# Patient Record
Sex: Female | Born: 1970
Health system: Southern US, Community
[De-identification: ages and names within clinical notes are randomized; demographics above are authoritative.]

## PROBLEM LIST (undated history)

## (undated) DIAGNOSIS — F32A Depression, unspecified: Secondary | ICD-10-CM

## (undated) DIAGNOSIS — M1990 Unspecified osteoarthritis, unspecified site: Secondary | ICD-10-CM

## (undated) DIAGNOSIS — T7840XA Allergy, unspecified, initial encounter: Secondary | ICD-10-CM

## (undated) DIAGNOSIS — K219 Gastro-esophageal reflux disease without esophagitis: Secondary | ICD-10-CM

## (undated) DIAGNOSIS — Z9889 Other specified postprocedural states: Secondary | ICD-10-CM

## (undated) DIAGNOSIS — R112 Nausea with vomiting, unspecified: Secondary | ICD-10-CM

## (undated) DIAGNOSIS — D649 Anemia, unspecified: Secondary | ICD-10-CM

## (undated) DIAGNOSIS — G709 Myoneural disorder, unspecified: Secondary | ICD-10-CM

## (undated) DIAGNOSIS — F419 Anxiety disorder, unspecified: Secondary | ICD-10-CM

## (undated) DIAGNOSIS — E119 Type 2 diabetes mellitus without complications: Secondary | ICD-10-CM

## (undated) DIAGNOSIS — E785 Hyperlipidemia, unspecified: Secondary | ICD-10-CM

## (undated) DIAGNOSIS — I10 Essential (primary) hypertension: Secondary | ICD-10-CM

## (undated) HISTORY — PX: INDUCED ABORTION: SHX677

## (undated) HISTORY — DX: Gastro-esophageal reflux disease without esophagitis: K21.9

## (undated) HISTORY — DX: Type 2 diabetes mellitus without complications: E11.9

## (undated) HISTORY — DX: Unspecified osteoarthritis, unspecified site: M19.90

## (undated) HISTORY — DX: Hyperlipidemia, unspecified: E78.5

## (undated) HISTORY — DX: Nausea with vomiting, unspecified: R11.2

## (undated) HISTORY — DX: Anemia, unspecified: D64.9

## (undated) HISTORY — PX: OTHER SURGICAL HISTORY: SHX169

## (undated) HISTORY — DX: Other specified postprocedural states: Z98.890

## (undated) HISTORY — DX: Allergy, unspecified, initial encounter: T78.40XA

## (undated) HISTORY — PX: DILATION AND CURETTAGE OF UTERUS: SHX78

## (undated) HISTORY — DX: Essential (primary) hypertension: I10

## (undated) HISTORY — DX: Anxiety disorder, unspecified: F41.9

## (undated) HISTORY — DX: Depression, unspecified: F32.A

## (undated) HISTORY — DX: Myoneural disorder, unspecified: G70.9

---

## 2011-04-28 ENCOUNTER — Telehealth (HOSPITAL_BASED_OUTPATIENT_CLINIC_OR_DEPARTMENT_OTHER): Payer: Self-pay | Admitting: Family Medicine

## 2011-05-05 ENCOUNTER — Ambulatory Visit (HOSPITAL_BASED_OUTPATIENT_CLINIC_OR_DEPARTMENT_OTHER): Payer: BC Managed Care – PPO | Attending: Family Medicine

## 2011-05-05 DIAGNOSIS — G4710 Hypersomnia, unspecified: Secondary | ICD-10-CM | POA: Insufficient documentation

## 2011-05-05 DIAGNOSIS — I4949 Other premature depolarization: Secondary | ICD-10-CM | POA: Insufficient documentation

## 2011-05-05 DIAGNOSIS — G4730 Sleep apnea, unspecified: Secondary | ICD-10-CM | POA: Insufficient documentation

## 2011-05-08 DIAGNOSIS — G4710 Hypersomnia, unspecified: Secondary | ICD-10-CM

## 2011-05-08 DIAGNOSIS — R0609 Other forms of dyspnea: Secondary | ICD-10-CM

## 2011-05-08 DIAGNOSIS — R0989 Other specified symptoms and signs involving the circulatory and respiratory systems: Secondary | ICD-10-CM

## 2011-05-08 DIAGNOSIS — G4730 Sleep apnea, unspecified: Secondary | ICD-10-CM

## 2011-05-08 NOTE — Procedures (Signed)
Carolyn Butler, Carolyn Butler                ACCOUNT NO.:  [id number]  MEDICAL RECORD NO.:  [id number]          PATIENT TYPE:  OUT  LOCATION:  SLEEP CENTER                 FACILITY:  [facility]  PHYSICIAN:  Quindarius Cabello D. Maple Hudson, MD, FCCP, FACPDATE OF BIRTH:  01-26-70  DATE OF STUDY:  05/05/2011                           NOCTURNAL POLYSOMNOGRAM  REFERRING PHYSICIAN:  Maurice March, M.D.  REFERRING PHYSICIAN:  Maurice March, MD  INDICATION FOR STUDY:  Hypersomnia with sleep apnea.  EPWORTH SLEEPINESS SCORE:  4/24.  BMI 34.7, weight 215 pounds, height 66 inches, neck 15 inches.  MEDICATIONS:  Home medications are charted and reviewed.  SLEEP ARCHITECTURE:  Total sleep time 285.5 minutes with sleep efficiency 74.4%.  Stage I was 10.5%.  Stage II 68%.  Stage III 0.7%. REM 20.8% of total sleep time.  Sleep latency 20 minutes, REM latency 75.5 minutes. Awake after sleep onset 79.5 minutes.  Arousal index 27.5.  BEDTIME MEDICATION:  None.  RESPIRATORY DATA:  Apnea-hypopnea index (AHI) 3.6 per hour. A total of 17 events was scored, all as hypopneas associated with nonsupine sleep position, and REM.  REM AHI 17.1 per hour. There were insufficient numbers of events to qualify for application of CPAP split protocol titration on this study night.  OXYGEN DATA:  Loud snoring with oxygen desaturation to a nadir of 89% and a mean oxygen saturation through the study is 96% on room air.  CARDIAC DATA:  Sinus rhythm with PVCs.  MOVEMENT-PARASOMNIA:  No significant movement disturbance.  Bathroom x1.  IMPRESSIONS-RECOMMENDATIONS: 1. Occasional respiratory events with respiratory disturbance, within     normal limits.  AHI 3.6 per hour (the normal range for adult is     between 0 and 5 per hour).  Loud snoring with oxygen desaturation     to a nadir of 89% and a mean oxygen saturation through the study of     96% on room air. 2. She reports difficulty at home initiating and maintaining sleep  and     also multiple bathroom trips.  On the study night, she was awake     without spontaneous reason between 2:30 and 3:15 a.m.  A single     bathroom trip.     Jalyiah Shelley D. Maple Hudson, MD, [facility], FACP Diplomate, Biomedical engineer of Sleep Medicine Electronically Signed    CDY/MEDQ  D:  05/08/2011 14:42:04  T:  05/08/2011 14:56:24  Job:  [id number]

## 2011-05-19 ENCOUNTER — Encounter (HOSPITAL_BASED_OUTPATIENT_CLINIC_OR_DEPARTMENT_OTHER): Payer: Self-pay

## 2011-08-13 ENCOUNTER — Ambulatory Visit: Payer: Self-pay | Admitting: Family Medicine

## 2011-08-17 ENCOUNTER — Ambulatory Visit (INDEPENDENT_AMBULATORY_CARE_PROVIDER_SITE_OTHER): Payer: BC Managed Care – PPO | Admitting: Family Medicine

## 2011-08-17 DIAGNOSIS — D518 Other vitamin B12 deficiency anemias: Secondary | ICD-10-CM

## 2011-08-29 ENCOUNTER — Other Ambulatory Visit: Payer: Self-pay | Admitting: Family Medicine

## 2011-10-03 ENCOUNTER — Other Ambulatory Visit: Payer: Self-pay | Admitting: Family Medicine

## 2011-11-16 ENCOUNTER — Ambulatory Visit (INDEPENDENT_AMBULATORY_CARE_PROVIDER_SITE_OTHER): Payer: BC Managed Care – PPO | Admitting: Family Medicine

## 2011-11-16 ENCOUNTER — Encounter: Payer: Self-pay | Admitting: Family Medicine

## 2011-11-16 VITALS — BP 135/97 | HR 97 | Temp 98.3°F | Resp 20 | Ht 66.0 in | Wt 223.6 lb

## 2011-11-16 DIAGNOSIS — G479 Sleep disorder, unspecified: Secondary | ICD-10-CM

## 2011-11-16 DIAGNOSIS — N907 Vulvar cyst: Secondary | ICD-10-CM

## 2011-11-16 DIAGNOSIS — N9089 Other specified noninflammatory disorders of vulva and perineum: Secondary | ICD-10-CM

## 2011-11-16 DIAGNOSIS — N951 Menopausal and female climacteric states: Secondary | ICD-10-CM

## 2011-11-16 DIAGNOSIS — IMO0001 Reserved for inherently not codable concepts without codable children: Secondary | ICD-10-CM

## 2011-11-16 DIAGNOSIS — I10 Essential (primary) hypertension: Secondary | ICD-10-CM

## 2011-11-16 LAB — POCT GLYCOSYLATED HEMOGLOBIN (HGB A1C): Hemoglobin A1C: 8.9

## 2011-11-16 MED ORDER — DOXYCYCLINE HYCLATE 100 MG PO TABS: 100.0000 mg | ORAL_TABLET | Freq: Two times a day (BID) | ORAL | Status: AC

## 2011-11-16 MED ORDER — METFORMIN HCL 1000 MG PO TABS: 1000.0000 mg | ORAL_TABLET | Freq: Two times a day (BID) | ORAL | Status: DC

## 2011-11-16 MED ORDER — LOSARTAN POTASSIUM 50 MG PO TABS: 50.0000 mg | ORAL_TABLET | Freq: Every day | ORAL | Status: DC

## 2011-11-16 NOTE — Patient Instructions (Signed)
Folliculitis  Folliculitis is an infection and inflammation of the hair follicles. Hair follicles become red and irritated. This inflammation is usually caused by bacteria. The bacteria thrive in warm, moist environments. This condition can be seen anywhere on the body.  CAUSES The most common cause of folliculitis is an infection by germs (bacteria). Fungal and viral infections can also cause the condition. Viral infections may be more common in people whose bodies are unable to fight disease well (weakened immune systems). Examples include people with:  AIDS.   An organ transplant.   Cancer.  People with depressed immune systems, diabetes, or obesity, have a greater risk of getting folliculitis than the general population. Certain chemicals, especially oils and tars, also can cause folliculitis. SYMPTOMS  An early sign of folliculitis is a small, white or yellow pus-filled, itchy lesion (pustule). These lesions appear on a red, inflamed follicle. They are usually less than 5 mm (.20 inches).   The most likely starting points are the scalp, thighs, legs, back and buttocks. Folliculitis is also frequently found in areas of repeated shaving.   When an infection of the follicle goes deeper, it becomes a boil or furuncle. A group of closely packed boils create a larger lesion (a carbuncle). These sores (lesions) tend to occur in hairy, sweaty areas of the body.  TREATMENT   A doctor who specializes in skin problems (dermatologists) treats mild cases of folliculitis with antiseptic washes.   They also use a skin application which kills germs (topical antibiotics). Tea tree oil is a good topical antiseptic as well. It can be found at a health food store. A small percentage of individuals may develop an allergy to the tea tree oil.   Mild to moderate boils respond well to warm water compresses applied three times daily.   In some cases, oral antibiotics should be taken with the skin treatment.     If lesions contain large quantities of pus or fluid, your caregiver may drain them. This allows the topical antibiotics to get to the affected areas better.   Stubborn cases of folliculitis may respond to laser hair removal. This process uses a high intensity light beam (a laser) to destroy the follicle and reduces the scarring from folliculitis. After laser hair removal, hair will no longer grow in the laser treated area.  Patients with long-lasting folliculitis need to find out where the infection is coming from. Germs can live in the nostrils of the patient. This can trigger an outbreak now and then. Sometimes the bacteria live in the nostrils of a family member. This person does not develop the disorder but they repeatedly re-expose others to the germ. To break the cycle of recurrence in the patient, the family member must also undergo treatment. PREVENTION   Individuals who are predisposed to folliculitis should be extremely careful about personal hygiene.   Application of antiseptic washes may help prevent recurrences.   A topical antibiotic cream, mupirocin (Bactroban), has been effective at reducing bacteria in the nostrils. It is applied inside the nose with your little finger. This is done twice daily for a week. Then it is repeated every 6 months.   Because follicle disorders tend to come back, patients must receive follow-up care. Your caregiver may be able to recognize a recurrence before it becomes severe.  SEEK IMMEDIATE MEDICAL CARE IF:   You develop redness, swelling, or increasing pain in the area.   You have a fever.   You are not improving with treatment  or are getting worse.   You have any other questions or concerns.  Document Released: 09/20/2001 Document Revised: 07/01/2011 Document Reviewed: 07/17/2008 [facility] Patient Information 2012 [place], [place].    Perimenopause Perimenopause is the time when your body begins to move into the menopause (no menstrual  period for 12 straight months). It is a natural process. Perimenopause can begin 2 to 8 years before the menopause and usually lasts for one year after the menopause. During this time, your ovaries may or may not produce an egg. The ovaries vary in their production of estrogen and progesterone hormones each month. This can cause irregular menstrual periods, difficulty in getting pregnant, vaginal bleeding between periods and uncomfortable symptoms. CAUSES  Irregular production of the ovarian hormones, estrogen and progesterone, and not ovulating every month.   Other causes include:   Tumor of the pituitary gland in the brain.   Medical disease that affects the ovaries.   Radiation treatment.   Chemotherapy.   Unknown causes.   Heavy smoking and excessive alcohol intake can bring on perimenopause sooner.  SYMPTOMS   Hot flashes.   Night sweats.   Irregular menstrual periods.   Decrease sex drive.   Vaginal dryness.   Headaches.   Mood swings.   Depression.   Memory problems.   Irritability.   Tiredness.   Weight gain.   Trouble getting pregnant.   The beginning of losing bone cells (osteoporosis).   The beginning of hardening of the arteries (atherosclerosis).  DIAGNOSIS  Your caregiver will make a diagnosis by analyzing your age, menstrual history and your symptoms. They will do a physical exam noting any changes in your body, especially your female organs. Female hormone tests may or may not be helpful depending on the amount and when you produce the female hormones. However, other hormone tests may be helpful (ex. thyroid hormone) to rule out other problems. TREATMENT  The decision to treat during the perimenopause should be made by you and your caregiver depending on how the symptoms are affecting you and your life style. There are various treatments available such as:  Treating individual symptoms with a specific medication for that symptom (ex. tranquilizer  for depression).   Herbal medications that can help specific symptoms.   Counseling.   Group therapy.   No treatment.  HOME CARE INSTRUCTIONS   Before seeing your caregiver, make a list of your menstrual periods (when the occur, how heavy they are, how long between periods and how long they last), your symptoms and when they started.   Take the medication as recommended by your caregiver.   Sleep and rest.   Exercise.   Eat a diet that contains calcium (good for your bones) and soy (acts like estrogen hormone).   Do not smoke.   Avoid alcoholic beverages.   Taking vitamin E may help in certain cases.   Take calcium and vitamin D supplements to help prevent bone loss.   Group therapy is sometimes helpful.   Acupuncture may help in some cases.  SEEK MEDICAL CARE IF:   You have any of the above and want to know if it is perimenopause.   You want advice and treatment for any of your symptoms mentioned above.   You need a referral to a specialist (gynecologist, psychiatrist or psychologist).  SEEK IMMEDIATE MEDICAL CARE IF:   You have vaginal bleeding.   Your period lasts longer than 8 days.   You periods are recurring sooner than 21 days.  You have bleeding after intercourse.   You have severe depression.   You have pain when you urinate.   You have severe headaches.   You develop vision problems.  Document Released: 08/19/2004 Document Revised: 07/01/2011 Document Reviewed: 05/09/2008 [facility] Patient Information 2012 [place], [place].

## 2011-11-16 NOTE — Progress Notes (Signed)
  Subjective:    Patient ID: Carolyn Butler, female    DOB: Jan 29, 1971, 40 y.o.   MRN: [id number]  HPI   This pt is here for 43-month Diabetes recheck. FSBS 3x/ week. Morning sugars are always high.  She denies missing any doses of Metformin and has no sugars below 70. She has been having some   muscle cramps. Taking OTC Vit D3 2000IU (4 caps daily= 8000IU) and getting some daily sun exposure.  She had a cystic lesion on the vaginal area; it was very painful 4 days ago. The swelling resolved with  warm compresses. She had a similar lesion come up about 6 months ago in the same area.  Her menses has been irregular menses (normal in January, skipped February, very light in March  and April). Having other peri-menopausal symptoms (night sweats, hot flashes.moodiness).    Review of Systems  As per HPI     Objective:   Physical Exam  Vitals reviewed. Constitutional: She is oriented to person, place, and time. She appears well-developed and well-nourished.       Pt has lost 5 lbs. since January  HENT:  Head: Normocephalic and atraumatic.  Cardiovascular: Normal rate.   Pulmonary/Chest: Effort normal. No respiratory distress.  Genitourinary:    There is no rash, tenderness or lesion on the right labia. There is tenderness and lesion on the left labia. No vaginal discharge found.       1- 1.5 cm subcutaneous nodule lesion which is tender; not drainage or ulceration  Neurological: She is alert and oriented to person, place, and time. No cranial nerve deficit.    Results for orders placed in visit on 11/16/11  POCT GLYCOSYLATED HEMOGLOBIN (HGB A1C)      Component Value Range   Hemoglobin A1C 8.04 August 2011  LABS:   Cr=0.43     SGOT= 43    SGPT= 51   Microalbumin= 3.47    Vit D= 23     Assessment & Plan:   1. Type II or unspecified type diabetes mellitus without mention of complication, uncontrolled  January Hb A1C= 7.1% Continue weight reduction and portion control; Medication  RFs routed to pharmacy  2. Labial cyst  RX: Doxycycline 100 mg  1 tab twice daily  Continue sitz baths/ warm compresses  3. Perimenopause  Print info given

## 2011-11-20 ENCOUNTER — Encounter: Payer: Self-pay | Admitting: Family Medicine

## 2011-11-20 DIAGNOSIS — G479 Sleep disorder, unspecified: Secondary | ICD-10-CM | POA: Insufficient documentation

## 2011-11-20 DIAGNOSIS — E669 Obesity, unspecified: Secondary | ICD-10-CM | POA: Insufficient documentation

## 2011-11-20 DIAGNOSIS — I10 Essential (primary) hypertension: Secondary | ICD-10-CM | POA: Insufficient documentation

## 2011-11-24 ENCOUNTER — Telehealth: Payer: Self-pay

## 2011-11-24 NOTE — Telephone Encounter (Signed)
Pt states that she thinks that she may be allergic to the medication that she was prescribed by Dr.Mcpherson. Pt would not give any further information regarding symptoms

## 2011-11-25 NOTE — Telephone Encounter (Signed)
Called patient. Had to leave a message to have patient call back.

## 2011-11-25 NOTE — Telephone Encounter (Signed)
Please get more info.

## 2011-11-26 NOTE — Telephone Encounter (Signed)
LMOM to CB. We only have one phone number for pt.

## 2011-11-27 MED ORDER — FLUCONAZOLE 150 MG PO TABS: 150.0000 mg | ORAL_TABLET | Freq: Once | ORAL | Status: AC

## 2011-11-27 NOTE — Telephone Encounter (Signed)
Diflucan sent to the pharmacy

## 2011-11-27 NOTE — Telephone Encounter (Signed)
PT THINKS SHE MAY HAVE A YEAST INFECTION.  SHE IS ALMOST FINISH WITH THE DOXY.  SHE HAS TRIED MONISTAT AND THAT IS NOT WORKING.  CAN WE GIVE HER SOMETHING FOR YEAST

## 2011-11-27 NOTE — Telephone Encounter (Signed)
Pt.notified

## 2012-01-10 ENCOUNTER — Other Ambulatory Visit: Payer: Self-pay | Admitting: Physician Assistant

## 2012-02-03 ENCOUNTER — Encounter: Payer: Self-pay | Admitting: Family Medicine

## 2012-02-03 ENCOUNTER — Ambulatory Visit (INDEPENDENT_AMBULATORY_CARE_PROVIDER_SITE_OTHER): Payer: BC Managed Care – PPO | Admitting: Family Medicine

## 2012-02-03 VITALS — BP 144/90 | HR 105 | Temp 97.9°F | Resp 18 | Ht 64.75 in | Wt 219.0 lb

## 2012-02-03 DIAGNOSIS — N760 Acute vaginitis: Secondary | ICD-10-CM

## 2012-02-03 LAB — POCT GLYCOSYLATED HEMOGLOBIN (HGB A1C): Hemoglobin A1C: 10.3

## 2012-02-03 LAB — POCT WET PREP WITH KOH

## 2012-02-03 MED ORDER — FLUCONAZOLE 150 MG PO TABS: 150.0000 mg | ORAL_TABLET | Freq: Once | ORAL | Status: AC

## 2012-02-03 MED ORDER — GLIMEPIRIDE 2 MG PO TABS: ORAL_TABLET | ORAL | Status: DC

## 2012-02-03 MED ORDER — METRONIDAZOLE 500 MG PO TABS: ORAL_TABLET | ORAL | Status: DC

## 2012-02-03 NOTE — Progress Notes (Signed)
  Subjective:    Patient ID: Carolyn Butler, female    DOB: [birth year], 41 y.o.   MRN: [id number]  HPI   Pt is a 41 y.o. Diabetic Diabetic who is having a lot of vag itch and discharge for several months. OTC  Monistat -7 not very helpful. Concerned that she may have left a tampon in or problem related to using [place] lubricant.  FSBS have not been checked daily and fasting this AM was "very high". Had dinner at [facility] last  night at 8 PM with chicken skewers (ate 1 skewer) and pita bread with another starch. She has been  doing "shakes" in the AM and adding a little sugar and instant coffee for flavor.   Menses irreg but job has been very stressful.    Review of Systems  Constitutional: Negative for fever, activity change and appetite change.  Eyes: Negative for visual disturbance.  Cardiovascular: Negative for chest pain and palpitations.  Genitourinary: Positive for dysuria, vaginal discharge, vaginal pain and menstrual problem. Negative for pelvic pain.  Neurological: Negative for light-headedness, numbness and headaches.       Objective:   Physical Exam  Vitals reviewed. Constitutional: She is oriented to person, place, and time. She appears well-developed and well-nourished. No distress.  HENT:  Head: Normocephalic and atraumatic.  Eyes: EOM are normal. No scleral icterus.  Cardiovascular: Normal rate.   Pulmonary/Chest: Effort normal. No respiratory distress.  Abdominal: She exhibits no mass. There is no tenderness. There is no guarding.  Genitourinary: There is rash on the right labia. There is no tenderness or lesion on the right labia. There is rash on the left labia. There is no tenderness or lesion on the left labia. There is erythema around the vagina. No tenderness or bleeding around the vagina. No foreign body around the vagina. Vaginal discharge found.  Neurological: She is alert and oriented to person, place, and time. No cranial nerve deficit.  Skin: Skin is warm and dry.     Results for orders placed in visit on 02/03/12  POCT GLYCOSYLATED HEMOGLOBIN (HGB A1C)      Component Value Range   Hemoglobin A1C 10.3    POCT WET PREP WITH KOH      Component Value Range   Trichomonas, UA Negative     Clue Cells Wet Prep HPF POC 10-20     Epithelial Wet Prep HPF POC tntc     Yeast Wet Prep HPF POC neg     Bacteria Wet Prep HPF POC 2+     RBC Wet Prep HPF POC 0-9     WBC Wet Prep HPF POC 0-8     KOH Prep POC Positive           Assessment & Plan:   1. Type II or unspecified type diabetes mellitus without mention of complication, uncontrolled  Continue Metformin 1000 mg bid RX: Amaryl 2 mg 1 tab bid before meals Diabetic meal planning and continued focus on weight reduction  2. Vaginitis and vulvovaginitis  RX: Metronidazole 500 mg 1 tab bid  # 14 RX: Diflucan 150 mg 1 tab x 1 dose Print info re: Bact. Vag. Discontinue use of scented [place] lubricants  (use plain product)

## 2012-02-03 NOTE — Patient Instructions (Signed)
Bacterial Vaginosis Bacterial vaginosis (BV) is a vaginal infection where the normal balance of bacteria in the vagina is disrupted. The normal balance is then replaced by an overgrowth of certain bacteria. There are several different kinds of bacteria that can cause BV. BV is the most common vaginal infection in women of childbearing age. CAUSES   The cause of BV is not fully understood. BV develops when there is an increase or imbalance of harmful bacteria.   Some activities or behaviors can upset the normal balance of bacteria in the vagina and put women at increased risk including:   Having a new sex partner or multiple sex partners.   Douching.   Using an intrauterine device (IUD) for contraception.   It is not clear what role sexual activity plays in the development of BV. However, women that have never had sexual intercourse are rarely infected with BV.  Women do not get BV from toilet seats, bedding, swimming pools or from touching objects around them.  SYMPTOMS   Grey vaginal discharge.   A fish-like odor with discharge, especially after sexual intercourse.   Itching or burning of the vagina and vulva.   Burning or pain with urination.   Some women have no signs or symptoms at all.  DIAGNOSIS  Your caregiver must examine the vagina for signs of BV. Your caregiver will perform lab tests and look at the sample of vaginal fluid through a microscope. They will look for bacteria and abnormal cells (clue cells), a pH test higher than 4.5, and a positive amine test all associated with BV.  RISKS AND COMPLICATIONS   Pelvic inflammatory disease (PID).   Infections following gynecology surgery.   Developing HIV.   Developing herpes virus.  TREATMENT  Sometimes BV will clear up without treatment. However, all women with symptoms of BV should be treated to avoid complications, especially if gynecology surgery is planned. Female partners generally do not need to be treated. However,  BV may spread between female sex partners so treatment is helpful in preventing a recurrence of BV.   BV may be treated with antibiotics. The antibiotics come in either pill or vaginal cream forms. Either can be used with nonpregnant or pregnant women, but the recommended dosages differ. These antibiotics are not harmful to the baby.   BV can recur after treatment. If this happens, a second round of antibiotics will often be prescribed.   Treatment is important for pregnant women. If not treated, BV can cause a premature delivery, especially for a pregnant woman who had a premature birth in the past. All pregnant women who have symptoms of BV should be checked and treated.   For chronic reoccurrence of BV, treatment with a type of prescribed gel vaginally twice a week is helpful.  HOME CARE INSTRUCTIONS   Finish all medication as directed by your caregiver.   Do not have sex until treatment is completed.   Tell your sexual partner that you have a vaginal infection. They should see their caregiver and be treated if they have problems, such as a mild rash or itching.   Practice safe sex. Use condoms. Only have 1 sex partner.  PREVENTION  Basic prevention steps can help reduce the risk of upsetting the natural balance of bacteria in the vagina and developing BV:  Do not have sexual intercourse (be abstinent).   Do not douche.   Use all of the medicine prescribed for treatment of BV, even if the signs and symptoms go away.     Tell your sex partner if you have BV. That way, they can be treated, if needed, to prevent reoccurrence.  SEEK MEDICAL CARE IF:   Your symptoms are not improving after 3 days of treatment.   You have increased discharge, pain, or fever.  MAKE SURE YOU:   Understand these instructions.   Will watch your condition.   Will get help right away if you are not doing well or get worse.  FOR MORE INFORMATION  Division of STD Prevention (DSTDP), Centers for Disease  Control and Prevention: [employer]Apps.[employer] American Social Health Association (ASHA): www.ashastd.org  Document Released: 07/12/2005 Document Revised: 07/01/2011 Document Reviewed: 01/02/2009 [facility] Patient Information 2012 [place], [place]     .Diabetes Meal Planning Guide The diabetes meal planning guide is a tool to help you plan your meals and snacks. It is important for people with diabetes to manage their blood glucose (sugar) levels. Choosing the right foods and the right amounts throughout your day will help control your blood glucose. Eating right can even help you improve your blood pressure and reach or maintain a healthy weight. CARBOHYDRATE COUNTING MADE EASY When you eat carbohydrates, they turn to sugar. This raises your blood glucose level. Counting carbohydrates can help you control this level so you feel better. When you plan your meals by counting carbohydrates, you can have more flexibility in what you eat and balance your medicine with your food intake. Carbohydrate counting simply means adding up the total amount of carbohydrate grams in your meals and snacks. Try to eat about the same amount at each meal. Foods with carbohydrates are listed below. Each portion below is 1 carbohydrate serving or 15 grams of carbohydrates. Ask your dietician how many grams of carbohydrates you should eat at each meal or snack. Grains and Starches  1 slice bread.    English muffin or hotdog/hamburger bun.    cup cold cereal (unsweetened).   ? cup cooked pasta or rice.    cup starchy vegetables (corn, potatoes, peas, beans, winter squash).   1 tortilla (6 inches).    bagel.   1 waffle or pancake (size of a CD).    cup cooked cereal.   4 to 6 small crackers.  *Whole grain is recommended. Fruit  1 cup fresh unsweetened berries, melon, papaya, pineapple.   1 small fresh fruit.    banana or mango.    cup fruit juice (4 oz unsweetened).    cup canned fruit in natural  juice or water.   2 tbs dried fruit.   12 to 15 grapes or cherries.  Milk and Yogurt  1 cup fat-free or 1% milk.   1 cup soy milk.   6 oz light yogurt with sugar-free sweetener.   6 oz low-fat soy yogurt.   6 oz plain yogurt.  Vegetables  1 cup raw or  cup cooked is counted as 0 carbohydrates or a "free" food.   If you eat 3 or more servings at 1 meal, count them as 1 carbohydrate serving.  Other Carbohydrates   oz chips or pretzels.    cup ice cream or frozen yogurt.    cup sherbet or sorbet.   2 inch square cake, no frosting.   1 tbs honey, sugar, jam, jelly, or syrup.   2 small cookies.   3 squares of graham crackers.   3 cups popcorn.   6 crackers.   1 cup broth-based soup.   Count 1 cup casserole or other mixed foods as 2 carbohydrate  servings.   Foods with less than 20 calories in a serving may be counted as 0 carbohydrates or a "free" food.  You may want to purchase a book or computer software that lists the carbohydrate gram counts of different foods. In addition, the nutrition facts panel on the labels of the foods you eat are a good source of this information. The label will tell you how big the serving size is and the total number of carbohydrate grams you will be eating per serving. Divide this number by 15 to obtain the number of carbohydrate servings in a portion. Remember, 1 carbohydrate serving equals 15 grams of carbohydrate. SERVING SIZES Measuring foods and serving sizes helps you make sure you are getting the right amount of food. The list below tells how big or small some common serving sizes are.  1 oz.........4 stacked dice.   3 oz........Marland KitchenDeck of cards.   1 tsp.......Marland KitchenTip of little finger.   1 tbs......Marland KitchenMarland KitchenThumb.   2 tbs.......Marland KitchenGolf ball.    cup......Marland KitchenHalf of a fist.   1 cup.......Marland KitchenA fist.  SAMPLE DIABETES MEAL PLAN Below is a sample meal plan that includes foods from the grain and starches, dairy, vegetable, fruit, and meat  groups. A dietician can individualize a meal plan to fit your calorie needs and tell you the number of servings needed from each food group. However, controlling the total amount of carbohydrates in your meal or snack is more important than making sure you include all of the food groups at every meal. You may interchange carbohydrate containing foods (dairy, starches, and fruits). The meal plan below is an example of a 2000 calorie diet using carbohydrate counting. This meal plan has 17 carbohydrate servings. Breakfast  1 cup oatmeal (2 carb servings).    cup light yogurt (1 carb serving).   1 cup blueberries (1 carb serving).    cup almonds.  Snack  1 large apple (2 carb servings).   1 low-fat string cheese stick.  Lunch  Chicken breast salad.   1 cup spinach.    cup chopped tomatoes.   2 oz chicken breast, sliced.   2 tbs low-fat [place] dressing.   12 whole-wheat crackers (2 carb servings).   12 to 15 grapes (1 carb serving).   1 cup low-fat milk (1 carb serving).  Snack  1 cup carrots.    cup hummus (1 carb serving).  Dinner  3 oz broiled salmon.   1 cup brown rice (3 carb servings).  Snack  1  cups steamed broccoli (1 carb serving) drizzled with 1 tsp olive oil and lemon juice.   1 cup light pudding (2 carb servings).  DIABETES MEAL PLANNING WORKSHEET Your dietician can use this worksheet to help you decide how many servings of foods and what types of foods are right for you.  BREAKFAST Food Group and Servings / Carb Servings Grain/Starches __________________________________ Dairy __________________________________________ Vegetable ______________________________________ Fruit ___________________________________________ Meat __________________________________________ Fat ____________________________________________ LUNCH Food Group and Servings / Carb Servings Grain/Starches ___________________________________ Dairy  ___________________________________________ Fruit ____________________________________________ Meat ___________________________________________ Fat _____________________________________________ Laural Golden Food Group and Servings / Carb Servings Grain/Starches ___________________________________ Dairy ___________________________________________ Fruit ____________________________________________ Meat ___________________________________________ Fat _____________________________________________ SNACKS Food Group and Servings / Carb Servings Grain/Starches ___________________________________ Dairy ___________________________________________ Vegetable _______________________________________ Fruit ____________________________________________ Meat ___________________________________________ Fat _____________________________________________ DAILY TOTALS Starches _________________________ Vegetable ________________________ Fruit ____________________________ Dairy ____________________________ Meat ____________________________ Fat ______________________________ Document Released: 04/08/2005 Document Revised: 07/01/2011 Document Reviewed: 02/17/2009 [employer] Patient Information 2012 [place], [place].

## 2012-04-18 ENCOUNTER — Other Ambulatory Visit: Payer: Self-pay | Admitting: Physician Assistant

## 2012-05-01 ENCOUNTER — Other Ambulatory Visit: Payer: Self-pay | Admitting: Family Medicine

## 2012-05-27 ENCOUNTER — Other Ambulatory Visit: Payer: Self-pay | Admitting: Family Medicine

## 2012-07-05 ENCOUNTER — Ambulatory Visit (INDEPENDENT_AMBULATORY_CARE_PROVIDER_SITE_OTHER): Payer: BC Managed Care – PPO | Admitting: Family Medicine

## 2012-07-05 ENCOUNTER — Encounter: Payer: Self-pay | Admitting: Family Medicine

## 2012-07-05 VITALS — BP 134/82 | HR 100 | Temp 99.3°F | Resp 16 | Ht 65.0 in | Wt 233.0 lb

## 2012-07-05 DIAGNOSIS — N898 Other specified noninflammatory disorders of vagina: Secondary | ICD-10-CM

## 2012-07-05 DIAGNOSIS — Z8639 Personal history of other endocrine, nutritional and metabolic disease: Secondary | ICD-10-CM

## 2012-07-05 DIAGNOSIS — L293 Anogenital pruritus, unspecified: Secondary | ICD-10-CM

## 2012-07-05 DIAGNOSIS — R635 Abnormal weight gain: Secondary | ICD-10-CM

## 2012-07-05 MED ORDER — FLUCONAZOLE 150 MG PO TABS: 150.0000 mg | ORAL_TABLET | Freq: Once | ORAL | Status: DC

## 2012-07-05 MED ORDER — METFORMIN HCL 1000 MG PO TABS: 1000.0000 mg | ORAL_TABLET | Freq: Two times a day (BID) | ORAL | Status: DC

## 2012-07-05 MED ORDER — SITAGLIPTIN PHOSPHATE 100 MG PO TABS: 100.0000 mg | ORAL_TABLET | Freq: Every day | ORAL | Status: DC

## 2012-07-05 NOTE — Patient Instructions (Addendum)
1800 Calorie Diet for Diabetes Meal Planning The 1800 calorie diet is designed for eating up to 1800 calories each day. Following this diet and making healthy meal choices can help improve overall health. This diet controls blood sugar (glucose) levels and can also help lower blood pressure and cholesterol. SERVING SIZES Measuring foods and serving sizes helps to make sure you are getting the right amount of food. The list below tells how big or small some common serving sizes are:  1 oz.........4 stacked dice.  3 oz........Marland KitchenDeck of cards.  1 tsp.......Marland KitchenTip of little finger.  1 tbs......Marland KitchenMarland KitchenThumb.  2 tbs.......Marland KitchenGolf ball.   cup......Marland KitchenHalf of a fist.  1 cup.......Marland KitchenA fist. GUIDELINES FOR CHOOSING FOODS The goal of this diet is to eat a variety of foods and limit calories to 1800 each day. This can be done by choosing foods that are low in calories and fat. The diet also suggests eating small amounts of food frequently. Doing this helps control your blood glucose levels so they do not get too high or too low. Each meal or snack may include a protein food source to help you feel more satisfied and to stabilize your blood glucose. Try to eat about the same amount of food around the same time each day. This includes weekend days, travel days, and days off work. Space your meals about 4 to 5 hours apart and add a snack between them if you wish.  For example, a daily food plan could include breakfast, a morning snack, lunch, dinner, and an evening snack. Healthy meals and snacks include whole grains, vegetables, fruits, lean meats, poultry, fish, and dairy products. As you plan your meals, select a variety of foods. Choose from the bread and starch, vegetable, fruit, dairy, and meat/protein groups. Examples of foods from each group and their suggested serving sizes are listed below. Use measuring cups and spoons to become familiar with what a healthy portion looks like. Bread and Starch Each serving  equals 15 grams of carbohydrates.  1 slice bread.   bagel.   cup cold cereal (unsweetened).   cup hot cereal or mashed potatoes.  1 small potato (size of a computer mouse).   cup cooked pasta or rice.   English muffin.  1 cup broth-based soup.  3 cups of popcorn.  4 to 6 whole-wheat crackers.   cup cooked beans, peas, or corn. Vegetable Each serving equals 5 grams of carbohydrates.   cup cooked vegetables.  1 cup raw vegetables.   cup tomato or vegetable juice. Fruit Each serving equals 15 grams of carbohydrates.  1 small apple or orange.  1 cup watermelon or strawberries.   cup applesauce (no sugar added).  2 tbs raisins.   banana.   cup canned fruit, packed in water, its own juice, or sweetened with a sugar substitute.   cup unsweetened fruit juice. Dairy Each serving equals 12 to 15 grams of carbohydrates.  1 cup fat-free milk.  6 oz artificially sweetened yogurt or plain yogurt.  1 cup low-fat buttermilk.  1 cup soy milk.  1 cup almond milk. Meat/Protein  1 large egg.  2 to 3 oz meat, poultry, or fish.   cup low-fat cottage cheese.  1 tbs peanut butter.  1 oz low-fat cheese.   cup tuna in water.   cup tofu. Fat  1 tsp oil.  1 tsp trans-fat-free margarine.  1 tsp butter.  1 tsp mayonnaise.  2 tbs avocado.  1 tbs salad dressing.  1 tbs cream cheese.  2 tbs sour cream. SAMPLE 1800 CALORIE DIET PLAN Breakfast   cup unsweetened cereal (1 carb serving).  1 cup fat-free milk (1 carb serving).  1 slice whole-wheat toast (1 carb serving).   small banana (1 carb serving).  1 scrambled egg.  1 tsp trans-fat-free margarine. Lunch  Tuna sandwich.  2 slices whole-wheat bread (2 carb servings).   cup canned tuna in water, drained.  1 tbs reduced fat mayonnaise.  1 stalk celery, chopped.  2 slices tomato.  1 lettuce leaf.  1 cup carrot sticks.  24 to 30 seedless grapes (2 carb  servings).  6 oz light yogurt (1 carb serving). Afternoon Snack  3 graham cracker squares (1 carb serving).  Fat-free milk, 1 cup (1 carb serving).  1 tbs peanut butter. Dinner  3 oz salmon, broiled with 1 tsp oil.  1 cup mashed potatoes (2 carb servings) with 1 tsp trans-fat-free margarine.  1 cup fresh or frozen green beans.  1 cup steamed asparagus.  1 cup fat-free milk (1 carb serving). Evening Snack  3 cups air-popped popcorn (1 carb serving).  2 tbs parmesan cheese sprinkled on top. MEAL PLAN Use this worksheet to help you make a daily meal plan based on the 1800 calorie diet suggestions. If you are using this plan to help you control your blood glucose, you may interchange carbohydrate-containing foods (dairy, starches, and fruits). Select a variety of fresh foods of varying colors and flavors. The total amount of carbohydrate in your meals or snacks is more important than making sure you include all of the food groups every time you eat. Choose from the following foods to build your day's meals:  8 Starches.  4 Vegetables.  3 Fruits.  2 Dairy.  6 to 7 oz Meat/Protein.  Up to 4 Fats. Your dietician can use this worksheet to help you decide how many servings and which types of foods are right for you. BREAKFAST Food Group and Servings / Food Choice Starch ________________________________________________________ Dairy _________________________________________________________ Fruit _________________________________________________________ Meat/Protein __________________________________________________ Fat ___________________________________________________________ LUNCH Food Group and Servings / Food Choice Starch ________________________________________________________ Meat/Protein __________________________________________________ Vegetable _____________________________________________________ Fruit  _________________________________________________________ Dairy _________________________________________________________ Fat ___________________________________________________________ Aura Fey Food Group and Servings / Food Choice Starch ________________________________________________________ Meat/Protein __________________________________________________ Fruit __________________________________________________________ Dairy _________________________________________________________ Laural Golden Food Group and Servings / Food Choice Starch _________________________________________________________ Meat/Protein ___________________________________________________ Dairy __________________________________________________________ Vegetable ______________________________________________________ Fruit ___________________________________________________________ Fat ____________________________________________________________ Lollie Sails Food Group and Servings / Food Choice Fruit __________________________________________________________ Meat/Protein ___________________________________________________ Dairy __________________________________________________________ Starch _________________________________________________________ DAILY TOTALS Starch ____________________________ Vegetable _________________________ Fruit _____________________________ Dairy _____________________________ Meat/Protein______________________ Fat _______________________________ Document Released: 02/01/2005 Document Revised: 10/04/2011 Document Reviewed: 05/28/2011 [employer] Patient Information 2013 [place], [place].    It is important that you check your blood sugars daily so that you have an idea of how certain foods and exercise affect your sugar and make adjustments accordingly. Once you start exercising, you will want to follow your sugars sothat you do not have episodes of low sugars (which can happen when  you exercise). Check your sugar before exercising; if it is below 100, you should have a snack that has some carbs and protein in it prior to exercise.

## 2012-07-05 NOTE — Progress Notes (Signed)
S:  This 41 y.o. Cauc female has Type II DM; she is not testing FSBS. She admits weight gain but not sure how this happened because her eating habits have not changed. She is considering Weight Watchers. Plans to get a treadmill for home use and start a daily exercise routine; cold weather has curtailed her walking. She attributes some of the weight gain to Glimepiride.  She also c/o external vaginal itching w/o discharge or odor. She tried OTC [place] w/ Aloe to see if that would help. Her underwear do not have cotton panels. She also wears panty hose regularly.  ROS: Negative for diaphoresis, fatigue, vision disturbances, CP or tightness, palpitations, SOB or DOE, GI problems, polyuria, polydipsia, edema, HA, dizziness, tremor, numbness, weakness or syncope.  O:  Filed Vitals:   07/05/12 1125  BP: 134/82                                                 Weight up 14 lbs.  Pulse: 100  Temp: 99.3 F (37.4 C)  Resp: 16   GEN: In NAD; WN,WD.\ HENT: [place]/AT; EOMI w/ clear conj/sclerae. NECK: Supple w/o LAN. COR: RRR. LUNGS: Normal resp rate and effort. NEURO: A&O x 3; CNs intact. Nonfocal.  Results for orders placed in visit on 07/05/12  POCT GLYCOSYLATED HEMOGLOBIN (HGB A1C)      Component Value Range   Hemoglobin A1C 8.4      A/P:  1. Type II or unspecified type diabetes mellitus without mention of complication, uncontrolled  Comprehensive metabolic panel Discontinue Glimepiride RX: Januvia 100 mg 1 tab daily  2. H/O vitamin D deficiency  Vitamin D, 25-hydroxy  3. Weight gain, abnormal  Join weight watchers or other weight loss program and start daily exercise  4. Vaginal itching  RX: Diflucan 150 mg x 1 dose Panties need to have cotton panel in crotch Double rinse underwear

## 2012-07-06 LAB — COMPREHENSIVE METABOLIC PANEL
AST: 72 U/L — ABNORMAL HIGH (ref 0–37)
Alkaline Phosphatase: 32 U/L — ABNORMAL LOW (ref 39–117)
BUN: 6 mg/dL (ref 6–23)
Calcium: 10.1 mg/dL (ref 8.4–10.5)
Chloride: 94 mEq/L — ABNORMAL LOW (ref 96–112)
Creat: 0.46 mg/dL — ABNORMAL LOW (ref 0.50–1.10)
Total Bilirubin: 0.6 mg/dL (ref 0.3–1.2)

## 2012-07-06 NOTE — Progress Notes (Signed)
Quick Note:  Please call pt and advise that the following labs are abnormal... Some of your lab results came back a little abnormal. This is most likely associated with inadequately controlled Diabetes. Your blood sugar is above normal (which was expected); kidney function is normal.  Some of the liver enzymes are up due to "fatty" liver which we see with patients who are overweight. We will recheck these labs at you next visit.  It is really important that you stick to your commitment to lose weight. Carolyn Butler!  Copy to pt. ______

## 2012-09-06 ENCOUNTER — Ambulatory Visit (INDEPENDENT_AMBULATORY_CARE_PROVIDER_SITE_OTHER): Payer: BC Managed Care – PPO | Admitting: Family Medicine

## 2012-09-06 VITALS — BP 122/76 | HR 88 | Temp 98.1°F | Resp 16

## 2012-09-06 DIAGNOSIS — Z23 Encounter for immunization: Secondary | ICD-10-CM

## 2012-09-18 ENCOUNTER — Ambulatory Visit (INDEPENDENT_AMBULATORY_CARE_PROVIDER_SITE_OTHER): Payer: BC Managed Care – PPO | Admitting: Family Medicine

## 2012-09-18 VITALS — BP 139/91 | HR 105 | Temp 98.2°F | Resp 16 | Ht 65.5 in | Wt 219.4 lb

## 2012-09-18 DIAGNOSIS — N926 Irregular menstruation, unspecified: Secondary | ICD-10-CM

## 2012-09-18 LAB — POCT CBC
Granulocyte percent: 49.5 %G (ref 37–80)
MID (cbc): 0.5 (ref 0–0.9)
POC Granulocyte: 4.1 (ref 2–6.9)
POC LYMPH PERCENT: 44.9 %L (ref 10–50)
POC MID %: 5.6 %M (ref 0–12)
Platelet Count, POC: 442 10*3/uL — AB (ref 142–424)
RDW, POC: 15.7 %

## 2012-09-18 LAB — COMPREHENSIVE METABOLIC PANEL
AST: 20 U/L (ref 0–37)
BUN: 7 mg/dL (ref 6–23)
Calcium: 9.8 mg/dL (ref 8.4–10.5)
Chloride: 90 mEq/L — ABNORMAL LOW (ref 96–112)
Creat: 0.64 mg/dL (ref 0.50–1.10)

## 2012-09-18 LAB — GLUCOSE, POCT (MANUAL RESULT ENTRY): POC Glucose: 311 mg/dl — AB (ref 70–99)

## 2012-09-18 LAB — POCT GLYCOSYLATED HEMOGLOBIN (HGB A1C): Hemoglobin A1C: 10.4

## 2012-09-18 MED ORDER — FLUTICASONE PROPIONATE 50 MCG/ACT NA SUSP: 2.0000 | Freq: Every day | NASAL | Status: DC

## 2012-09-18 NOTE — Progress Notes (Signed)
[facility] [address], [place] [address] [phone]  Date:  09/18/2012   Name:  Carolyn Butler   DOB:  1971/03/16   MRN:  [id number]  PCP:  Dow Adolph, MD    Chief Complaint: Headache   History of Present Illness:  Carolyn Butler is a 42 y.o. very pleasant female patient who presents with the following:  Here today with several concerns and to follow- up her DM.    For the last 2 or 3 weeks she has noted onset of poor sleep, feeling tired during the day, sinus and chest congestion.  She notes a sharp headache that will occur over her right temple. Occasionally will occur on the left.  The pain may last a minute or less.  This has gone on for about 3 days.  She may notice this pain 7 or 8 times a day She has not noted any body aches, but does have some cramps in her calves that occur at night- this has gone on for about one month No fever.  No cough.  No GI symptoms.   No ST, no earache but her ears sometimes hurt when she talks on the phone for a long time.   She does not have a cough, but will sometimes "spit up phlegm from my throat."    She has not been testing her glucose much lately.   Her last A1c was 8.4 in December of 2013.    She will check her BP when she visits a store.  She found it was elevated when she checked it a few days ago.    LMP was about 10 days ago.  However, her menses have become irregular over the last year or so. She and her spouse are not using any contraception at this time  She is taking zantac, metformin and losartan. She does admit to a lot of stress at home and at work recently.  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  She feels that her moods go up and down, and she is taking her anger out on her husband.  She has felt this way for about a week- does not wish to start medication right now but would consider it if she does not feel better soon.   Patient  Active Problem List  Diagnosis  . Type II or unspecified type diabetes mellitus without mention of complication, uncontrolled  . Benign essential HTN  . Obesity, Class II, BMI 35-39.9, with comorbidity  . Sleep disturbance    Past Medical History  Diagnosis Date  . Diabetes mellitus without complication     No past surgical history on file.  History  Substance Use Topics  . Smoking status: Never Smoker   . Smokeless tobacco: Not on file  . Alcohol Use: Not on file    Family History  Problem Relation Age of Onset  . Diabetes Father   . Diabetes Paternal Grandmother   . Heart disease Paternal Grandmother     Allergies  Allergen Reactions  . Doxycycline     Medication list has been reviewed and updated.  Current Outpatient Prescriptions on File Prior to Visit  Medication Sig Dispense Refill  . cyclobenzaprine (FLEXERIL) 10 MG tablet TAKE ONE TABLET BY MOUTH AT BEDTIME AS NEEDED FOR MUSCLE SPASM  90 tablet  0  . losartan (COZAAR) 50 MG tablet TAKE ONE TABLET BY MOUTH EVERY DAY  30  tablet  4  . metFORMIN (GLUCOPHAGE) 1000 MG tablet Take 1 tablet (1,000 mg total) by mouth 2 (two) times daily with a meal.  60 tablet  3  . ranitidine (ZANTAC) 150 MG tablet TAKE ONE TABLET BY MOUTH TWICE DAILY FOR  GI  SYMPTOMS  180 tablet  1  . fluconazole (DIFLUCAN) 150 MG tablet Take 1 tablet (150 mg total) by mouth once.  1 tablet  0  . sitaGLIPtin (JANUVIA) 100 MG tablet Take 1 tablet (100 mg total) by mouth daily.  30 tablet  3   No current facility-administered medications on file prior to visit.    Review of Systems:  As per HPI- otherwise negative.   Physical Examination: Filed Vitals:   09/18/12 0816  BP: 139/91  Pulse: 105  Temp: 98.2 F (36.8 C)  Resp: 16   Filed Vitals:   09/18/12 0816  Height: 5' 5.5" (1.664 m)  Weight: 219 lb 6.4 oz (99.519 kg)   Body mass index is 35.94 kg/(m^2). Ideal Body Weight: Weight in (lb) to have BMI = 25: 152.2  GEN: WDWN, NAD,  Non-toxic, A & O x 3, obese HEENT: Atraumatic, Normocephalic. Neck supple. No masses, No LAD.  Bilateral TM wnl, oropharynx normal.  PEERL,EOMI.   Nasal cavity reveals strigy mucus typical of allergic rhinitis Ears and Nose: No external deformity. CV: RRR, No M/G/R. No JVD. No thrill. No extra heart sounds. PULM: CTA B, no wheezes, crackles, rhonchi. No retractions. No resp. distress. No accessory muscle use. ABD: S, NT, ND, +BS. No rebound. No HSM. EXTR: No c/c/e NEURO Normal gait. Normal strength, sensation and DTR all extremities, negative romberg.   PSYCH: Normally interactive. Conversant. Not depressed or anxious appearing.  Calm demeanor.   Results for orders placed in visit on 09/18/12  POCT CBC      Result Value Range   WBC 8.2  4.6 - 10.2 K/uL   Lymph, poc 3.7 (*) 0.6 - 3.4   POC LYMPH PERCENT 44.9  10 - 50 %L   MID (cbc) 0.5  0 - 0.9   POC MID % 5.6  0 - 12 %M   POC Granulocyte 4.1  2 - 6.9   Granulocyte percent 49.5  37 - 80 %G   RBC 4.80  4.04 - 5.48 M/uL   Hemoglobin 13.4  12.2 - 16.2 g/dL   HCT, POC 98.1  19.1 - 47.9 %   MCV 84.0  80 - 97 fL   MCH, POC 27.9  27 - 31.2 pg   MCHC 33.3  31.8 - 35.4 g/dL   RDW, POC 47.8     Platelet Count, POC 442 (*) 142 - 424 K/uL   MPV 9.1  0 - 99.8 fL  GLUCOSE, POCT (MANUAL RESULT ENTRY)      Result Value Range   POC Glucose 311 (*) 70 - 99 mg/dl  POCT GLYCOSYLATED HEMOGLOBIN (HGB A1C)      Result Value Range   Hemoglobin A1C 10.4    POCT URINE PREGNANCY      Result Value Range   Preg Test, Ur Negative     Assessment and Plan: Type II or unspecified type diabetes mellitus without mention of complication, not stated as uncontrolled - Plan: POCT glucose (manual entry), POCT glycosylated hemoglobin (Hb A1C), Ambulatory referral to diabetic education  Other malaise and fatigue - Plan: TSH  Menstrual irregularity - Plan: POCT urine pregnancy, TSH  Headache - Plan: POCT CBC  Elevated liver enzymes - Plan:  Comprehensive  metabolic panel  Allergic rhinitis - Plan: fluticasone (FLONASE) 50 MCG/ACT nasal spray  Her DM is not well controlled- went over this with Alric Seton. She is already on glucophage/ januvia, so her next step would be lantus.  She needs to work hard on her diet and exercise programs, and we will refer her to diabetes education  Her other symptoms may be related to allergies.  Will start her on flonase, and she an use an OTC antihistamine as needed  Increase BP medication to 1.5 or 75 mg a day.  Her headaches are not bothering her very much- she does not want to pursue imaging at this time but will let me know if her HA gets worse or if she has any other symptoms Will check her LFTs today  Anxiety/ feeling stressed: she has only had this problem for about one week and does not wish to start on any medication at this time, but will let me know if the problem persists.  In that case we will consider starting medication for anxiety/ depression Taegen Delker, MD

## 2012-09-18 NOTE — Progress Notes (Signed)
Called to go over her labs- Results for orders placed in visit on 09/18/12  COMPREHENSIVE METABOLIC PANEL      Result Value Range   Sodium 129 (*) 135 - 145 mEq/L   Potassium 4.1  3.5 - 5.3 mEq/L   Chloride 90 (*) 96 - 112 mEq/L   CO2 27  19 - 32 mEq/L   Glucose, Bld 354 (*) 70 - 99 mg/dL   BUN 7  6 - 23 mg/dL   Creat 1.61  0.96 - 0.45 mg/dL   Total Bilirubin 0.6  0.3 - 1.2 mg/dL   Alkaline Phosphatase 50  39 - 117 U/L   AST 20  0 - 37 U/L   ALT 31  0 - 35 U/L   Total Protein 7.7  6.0 - 8.3 g/dL   Albumin 4.6  3.5 - 5.2 g/dL   Calcium 9.8  8.4 - 40.9 mg/dL  TSH      Result Value Range   TSH 2.653  0.350 - 4.500 uIU/mL  POCT CBC      Result Value Range   WBC 8.2  4.6 - 10.2 K/uL   Lymph, poc 3.7 (*) 0.6 - 3.4   POC LYMPH PERCENT 44.9  10 - 50 %L   MID (cbc) 0.5  0 - 0.9   POC MID % 5.6  0 - 12 %M   POC Granulocyte 4.1  2 - 6.9   Granulocyte percent 49.5  37 - 80 %G   RBC 4.80  4.04 - 5.48 M/uL   Hemoglobin 13.4  12.2 - 16.2 g/dL   HCT, POC 81.1  91.4 - 47.9 %   MCV 84.0  80 - 97 fL   MCH, POC 27.9  27 - 31.2 pg   MCHC 33.3  31.8 - 35.4 g/dL   RDW, POC 78.2     Platelet Count, POC 442 (*) 142 - 424 K/uL   MPV 9.1  0 - 99.8 fL  GLUCOSE, POCT (MANUAL RESULT ENTRY)      Result Value Range   POC Glucose 311 (*) 70 - 99 mg/dl  POCT GLYCOSYLATED HEMOGLOBIN (HGB A1C)      Result Value Range   Hemoglobin A1C 10.4    POCT URINE PREGNANCY      Result Value Range   Preg Test, Ur Negative     Serum sodium corrects to 133.   Her LFTs are back to normal. She is bit low on sodium and chloride, so adding some extra salt to her diet may help get rid of her leg cramps.  She also needs a work note for today, tomorrow and Wednesday- she will use PTO but needs a note for this.

## 2012-09-18 NOTE — Patient Instructions (Addendum)
We are going to refer you to diabetes education, and we need to have you work hard on your diet and exercise program.  I will be in touch regarding the rest of your labs.  Please start the flonase nasal spray for your allergies.  You may also want to use claritin or zyrtec as well.  If you continue to feel very down or stressed please let me know  Increase your blood pressure medication to 1.5 pills a day and check your BP a few times at the drug stores

## 2012-09-19 ENCOUNTER — Encounter: Payer: Self-pay | Admitting: Family Medicine

## 2012-09-19 NOTE — Addendum Note (Signed)
Addended by: Abbe Amsterdam C on: 09/19/2012 11:51 AM   Modules accepted: Orders

## 2012-10-04 ENCOUNTER — Ambulatory Visit: Payer: BC Managed Care – PPO | Admitting: Family Medicine

## 2012-10-05 ENCOUNTER — Encounter: Payer: BC Managed Care – PPO | Attending: Family Medicine | Admitting: *Deleted

## 2012-10-05 ENCOUNTER — Encounter: Payer: Self-pay | Admitting: *Deleted

## 2012-10-05 VITALS — Ht 66.0 in | Wt 219.3 lb

## 2012-10-05 DIAGNOSIS — E669 Obesity, unspecified: Secondary | ICD-10-CM | POA: Insufficient documentation

## 2012-10-05 DIAGNOSIS — E119 Type 2 diabetes mellitus without complications: Secondary | ICD-10-CM | POA: Insufficient documentation

## 2012-10-05 DIAGNOSIS — Z713 Dietary counseling and surveillance: Secondary | ICD-10-CM | POA: Insufficient documentation

## 2012-10-05 NOTE — Patient Instructions (Signed)
Plan:  Aim for 3 Carb Choices per meal (45 grams) +/- 1 either way  Aim for 0-2 Carbs per snack if hungry  Consider reading food labels for Total Carbohydrate of foods Consider  increasing your activity level by water exercises for 15-30 minutes as tolerated

## 2012-10-05 NOTE — Progress Notes (Signed)
  Medical Nutrition Therapy:  Appt start time: 0915 end time:  1015.  Assessment:  Primary concerns today: patient here for diabetes and weight management. Lives with husband, she shops and prepares the meals. 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 Enjoys golf, swimming, walking and some hiking. SMBG infrequently due to pain of sticking her finger and the numbers make her feel bad.  MEDICATIONS: see list, diabetes medications include Metformin   DIETARY INTAKE:  Usual eating pattern includes 3 meals and 1-2 snacks per day.  Everyday foods include good variety of all food groups.  Avoided foods include: none stated.    24-hr recall:  B ( AM): Canadian bacon with 1 bread and fried egg and cheese, coffee with cream and 3-4 tsp sugar OR if husband cooks- grits, bacon, eggs, or pancakes, coffee  Snk ( AM): rarely  L ( PM): brings or buys - left overs from home OR Chipotle chic salad with sour cream, cheese and dressing, water or grapefruit Zazz drink Snk ( PM): Almonds or yogurt or 1/2 apple D ( PM): eat out often; prepares about 3 meals a week: meat, starch and veg type meal, salad, water Snk ( PM): occasional sweet food like cookies if in the house or frozen yogurt  Beverages: coffee, water,   Usual physical activity: limited due to leg and knee pain  Estimated energy needs: 1400 calories 158 g carbohydrates 105 g protein 39 g fat  Progress Towards Goal(s):  In progress.   Nutritional Diagnosis:  NB-1.1 Food and nutrition-related knowledge deficit As related to diabetes management.  As evidenced by A1c of 10.4% on 09/18/2012.    Intervention:  Nutrition counseling and diabetes education initiated. Discussed basic physiology of diabetes, SMBG and rationale of checking BG at alternate times of day, A1c, Carb Counting and reading food labels, and benefits of increased activity. Also discussed options of water exercises due to leg pains and provided locations for indoor pools  as well as visiting outdoor stores like [employer] that have outdoor activities.  Plan:  Aim for 3 Carb Choices per meal (45 grams) +/- 1 either way  Aim for 0-2 Carbs per snack if hungry  Consider reading food labels for Total Carbohydrate of foods Consider  increasing your activity level by water exercises for 15-30 minutes as tolerated  Handouts given during visit include: Living Well with Diabetes Carb Counting and Food Label handouts Meal Plan Card  List of gyms in area that have indoor pools and the [facility]  Monitoring/Evaluation:  Dietary intake, exercise, SMBG, and body weight in 4 weeks.

## 2012-10-07 ENCOUNTER — Other Ambulatory Visit: Payer: Self-pay | Admitting: Family Medicine

## 2012-10-13 ENCOUNTER — Ambulatory Visit: Payer: BC Managed Care – PPO | Admitting: *Deleted

## 2012-11-06 ENCOUNTER — Other Ambulatory Visit: Payer: Self-pay | Admitting: Physician Assistant

## 2012-11-06 ENCOUNTER — Other Ambulatory Visit: Payer: Self-pay | Admitting: Family Medicine

## 2012-11-07 ENCOUNTER — Ambulatory Visit: Payer: BC Managed Care – PPO | Admitting: *Deleted

## 2012-12-09 ENCOUNTER — Other Ambulatory Visit: Payer: Self-pay | Admitting: Physician Assistant

## 2012-12-09 NOTE — Telephone Encounter (Signed)
Needs office visit.

## 2012-12-14 ENCOUNTER — Ambulatory Visit (INDEPENDENT_AMBULATORY_CARE_PROVIDER_SITE_OTHER): Payer: BC Managed Care – PPO | Admitting: Emergency Medicine

## 2012-12-14 ENCOUNTER — Ambulatory Visit: Payer: BC Managed Care – PPO

## 2012-12-14 ENCOUNTER — Ambulatory Visit: Payer: BC Managed Care – PPO | Admitting: Family Medicine

## 2012-12-14 VITALS — BP 140/92 | HR 71 | Temp 98.2°F | Resp 18 | Wt 216.0 lb

## 2012-12-14 DIAGNOSIS — M7989 Other specified soft tissue disorders: Secondary | ICD-10-CM

## 2012-12-14 DIAGNOSIS — E119 Type 2 diabetes mellitus without complications: Secondary | ICD-10-CM

## 2012-12-14 DIAGNOSIS — E785 Hyperlipidemia, unspecified: Secondary | ICD-10-CM

## 2012-12-14 DIAGNOSIS — E871 Hypo-osmolality and hyponatremia: Secondary | ICD-10-CM | POA: Insufficient documentation

## 2012-12-14 DIAGNOSIS — IMO0001 Reserved for inherently not codable concepts without codable children: Secondary | ICD-10-CM

## 2012-12-14 DIAGNOSIS — E781 Pure hyperglyceridemia: Secondary | ICD-10-CM | POA: Insufficient documentation

## 2012-12-14 DIAGNOSIS — R1013 Epigastric pain: Secondary | ICD-10-CM

## 2012-12-14 LAB — POCT URINALYSIS DIPSTICK
Ketones, UA: 40
Urobilinogen, UA: 0.2
pH, UA: 5.5

## 2012-12-14 LAB — POCT UA - MICROSCOPIC ONLY
Casts, Ur, LPF, POC: NEGATIVE
Yeast, UA: NEGATIVE

## 2012-12-14 LAB — POCT CBC
Granulocyte percent: 54.1 %G (ref 37–80)
HCT, POC: 38.3 % (ref 37.7–47.9)
Hemoglobin: 13.6 g/dL (ref 12.2–16.2)
Lymph, poc: 3.1 (ref 0.6–3.4)
MCH, POC: 30.6 pg (ref 27–31.2)
MCHC: 35.5 g/dL — AB (ref 31.8–35.4)
MCV: 86 fL (ref 80–97)
MID (cbc): 0.4 (ref 0–0.9)
MPV: 9.2 fL (ref 0–99.8)
POC Granulocyte: 4.2 (ref 2–6.9)
POC LYMPH PERCENT: 40.8 %L (ref 10–50)
POC MID %: 5.1 %M (ref 0–12)
Platelet Count, POC: 369 10*3/uL (ref 142–424)
RBC: 4.45 M/uL (ref 4.04–5.48)
RDW, POC: 14.5 %
WBC: 7.7 10*3/uL (ref 4.6–10.2)

## 2012-12-14 LAB — COMPREHENSIVE METABOLIC PANEL
ALT: 28 U/L (ref 0–35)
AST: 21 U/L (ref 0–37)
Albumin: 4 g/dL (ref 3.5–5.2)
Alkaline Phosphatase: 45 U/L (ref 39–117)
BUN: 6 mg/dL (ref 6–23)
CO2: 24 mEq/L (ref 19–32)
Calcium: 9.6 mg/dL (ref 8.4–10.5)
Chloride: 91 mEq/L — ABNORMAL LOW (ref 96–112)
Creat: 0.59 mg/dL (ref 0.50–1.10)
Glucose, Bld: 240 mg/dL — ABNORMAL HIGH (ref 70–99)
Potassium: 3.8 mEq/L (ref 3.5–5.3)
Sodium: 128 mEq/L — ABNORMAL LOW (ref 135–145)
Total Bilirubin: 0.6 mg/dL (ref 0.3–1.2)
Total Protein: 7.1 g/dL (ref 6.0–8.3)

## 2012-12-14 LAB — POCT URINE PREGNANCY: Preg Test, Ur: NEGATIVE

## 2012-12-14 LAB — GLUCOSE, POCT (MANUAL RESULT ENTRY): POC Glucose: 270 mg/dl — AB (ref 70–99)

## 2012-12-14 LAB — LIPID PANEL
HDL: 22 mg/dL — ABNORMAL LOW (ref >39)
Total CHOL/HDL Ratio: 22.2 Ratio

## 2012-12-14 LAB — URIC ACID: Uric Acid, Serum: 3 mg/dL (ref 2.4–7.0)

## 2012-12-14 MED ORDER — MUPIROCIN 2 % EX OINT: TOPICAL_OINTMENT | Freq: Three times a day (TID) | CUTANEOUS | Status: DC

## 2012-12-14 NOTE — Progress Notes (Signed)
Subjective:    Patient ID: Carolyn Butler, female    DOB: Jul 01, 1971, 42 y.o.   MRN: [id number]  HPI bottom of right foot swollen, right big toe discomfort. She has a history of poorly controlled diabetes. She had hyponatremia on her last blood work. She has a history of elevated liver function tests in the past. She did not get hurt and it filled she states it was too expensive.     Review of Systems     Objective:   Physical Exam patient is alert and cooperative and not in distress. The neck is supple. chest is clear to auscultation and percussion.abdomen is soft there is tenderness in the midepigastrium.  There is no rebound noted. Examination of the right great toe reveals tenderness over the nail plate and adjacent soft tissue. There is also tenderness over the metatarsal heads Results for orders placed in visit on 12/14/12  POCT URINALYSIS DIPSTICK      Result Value Range   Color, UA YELLOW     Clarity, UA CLOUDY     Glucose, UA 500     Bilirubin, UA NEG     Ketones, UA 40     Spec Grav, UA 1.015     Blood, UA SMALL     pH, UA 5.5     Protein, UA TRACE     Urobilinogen, UA 0.2     Nitrite, UA NEG     Leukocytes, UA Trace    POCT UA - MICROSCOPIC ONLY      Result Value Range   WBC, Ur, HPF, POC 1-6     RBC, urine, microscopic 4-19     Bacteria, U Microscopic 4+     Mucus, UA LARGE     Epithelial cells, urine per micros 8-21     Crystals, Ur, HPF, POC NEG     Casts, Ur, LPF, POC NEG     Yeast, UA NEG    POCT CBC      Result Value Range   WBC 7.7  4.6 - 10.2 K/uL   Lymph, poc 3.1  0.6 - 3.4   POC LYMPH PERCENT 40.8  10 - 50 %L   MID (cbc) 0.4  0 - 0.9   POC MID % 5.1  0 - 12 %M   POC Granulocyte 4.2  2 - 6.9   Granulocyte percent 54.1  37 - 80 %G   RBC 4.45  4.04 - 5.48 M/uL   Hemoglobin 13.6  12.2 - 16.2 g/dL   HCT, POC 40.9  81.1 - 47.9 %   MCV 86.0  80 - 97 fL   MCH, POC 30.6  27 - 31.2 pg   MCHC 35.5 (*) 31.8 - 35.4 g/dL   RDW, POC 91.4     Platelet  Count, POC 369  142 - 424 K/uL   MPV 9.2  0 - 99.8 fL  POCT URINE PREGNANCY      Result Value Range   Preg Test, Ur Negative    GLUCOSE, POCT (MANUAL RESULT ENTRY)      Result Value Range   POC Glucose 270 (*) 70 - 99 mg/dl  [facility] reading (PRIMARY) by  Dr.Daub no abnormality seen        Assessment & Plan:  Film is normal. Her diabetes is totally out of control. She has a history of hyponatremia and has not had that followed up. We'll encourage her to get her Januvia filled. Encouraged her to be more vigilant about  her diet and exercise. Her serum was an extremely cloudy I suspect secondary to hyperlipidemia. I gave her some Bactroban to use around the nail to see if that would help with her toe discomfort

## 2012-12-16 LAB — URINE CULTURE: Organism ID, Bacteria: NO GROWTH

## 2013-01-03 ENCOUNTER — Encounter: Payer: Self-pay | Admitting: Family Medicine

## 2013-01-03 ENCOUNTER — Ambulatory Visit (INDEPENDENT_AMBULATORY_CARE_PROVIDER_SITE_OTHER): Payer: BC Managed Care – PPO | Admitting: Family Medicine

## 2013-01-03 VITALS — BP 128/72 | HR 104 | Temp 99.0°F | Resp 16 | Ht 65.0 in | Wt 213.0 lb

## 2013-01-03 DIAGNOSIS — IMO0001 Reserved for inherently not codable concepts without codable children: Secondary | ICD-10-CM

## 2013-01-03 DIAGNOSIS — E785 Hyperlipidemia, unspecified: Secondary | ICD-10-CM

## 2013-01-03 DIAGNOSIS — E669 Obesity, unspecified: Secondary | ICD-10-CM

## 2013-01-03 MED ORDER — GEMFIBROZIL 600 MG PO TABS: 600.0000 mg | ORAL_TABLET | Freq: Two times a day (BID) | ORAL | Status: DC

## 2013-01-03 MED ORDER — SITAGLIPTIN PHOSPHATE 100 MG PO TABS: 100.0000 mg | ORAL_TABLET | Freq: Every day | ORAL | Status: DC

## 2013-01-03 MED ORDER — METFORMIN HCL 1000 MG PO TABS: ORAL_TABLET | ORAL | Status: DC

## 2013-01-03 NOTE — Progress Notes (Signed)
S:  This 42 y.o. Cauc female has poorly controlled Type II DM. She has not been fully compliant with medications; she was seen at [facility] in May and advised to start taking Januvia which was first prescribed in Dec 2013. A1c= 10.4% in May. She did start this medication about 3 weeks ago; initial side effects have resolved (HA, nausea and sinus congestion).  She reports FSBS= >300s in mornings and then mid 200s by lunchtime.   Today, she c/o R foot feeling "swollen on the bottom" across the ball of her foot. She recently had a pedicure and had some discomfort and swelling around big toe nail. A topical cream was suggested and she has been applying that. She denies pain, burning or numbness in feet. Pt attributed swelling to additional salt that she has been ingesting since being told sodium was low; she was advised that she has SIADH as well as abnormally high lipids. She saw her blood at her visit in May- "it looked like milk".  Pt did consult w/ dietitian and has managed to lose a few pounds by following nutrition recommendations. She is willing to see an Endocrine specialist for help with Diabetes and lipid abnormality.  Patient Active Problem List   Diagnosis Date Noted  . Hyponatremia 12/14/2012  . Other and unspecified hyperlipidemia 12/14/2012  . Type II or unspecified type diabetes mellitus without mention of complication, uncontrolled 11/20/2011  . Benign essential HTN 11/20/2011  . Obesity, Class II, BMI 35-39.9, with comorbidity 11/20/2011  . Sleep disturbance 11/20/2011    PMHx, Soc Hx and Fam Hx reviewed.  ROS: As per HPI; negative for fever, diaphoresis, fatigue, hypoglycemic symptoms, CP or tightness, palpitations, SOB or cough, edema, HA, dizziness, weakness, numbness, myalgias, syncope, sleep disturbances, confusion or behavior changes.  OCeasar Mons Vitals:   01/03/13 0903  BP: 128/72                                  Weight is down 20 pounds since Dec 2013.  Pulse: 104  Temp:  99 F (37.2 C)  Resp: 16   GEN: In NAD; WN,WD. Pt is obese.   HENT: [place]/AT; EOMI w/ clear conj/ sclerae. EACs/nose/oroph normal. COR: RRR. Pulses 1-2+/=. LUNGS: Normal resp rate and effort. SKIN: W&D. No erythema or pallor. MS: MAEs; no c/c/e. Feet- no effusions or decreased ROM. No visible abnormality of R foot. NT, no discoloration. NEURO: A&O x 3; CNs intact. Nonfocal.  A/P: Type II or unspecified type diabetes mellitus without mention of complication, uncontrolled - Continue current medications and nutrition modifications.  Plan: Ambulatory referral to Endocrinology  Other and unspecified hyperlipidemia - Plan: Ambulatory referral to Endocrinology; RX: Gemfibrozil 600 mg 1 tablet bid with meals.  Obesity, unspecified- encouraged continued weight reduction.  Meds ordered this encounter  Medications  . VITAMIN E PO    Sig: Take by mouth daily.  . Omega-3 Fatty Acids (OMEGA 3 PO)    Sig: Take by mouth 2 (two) times daily with a meal.  . gemfibrozil (LOPID) 600 MG tablet    Sig: Take 1 tablet (600 mg total) by mouth 2 (two) times daily before a meal.    Dispense:  60 tablet    Refill:  5  . metFORMIN (GLUCOPHAGE) 1000 MG tablet    Sig: TAKE ONE TABLET BY MOUTH TWICE DAILY WITH MEALS    Dispense:  60 tablet    Refill:  3  .  sitaGLIPtin (JANUVIA) 100 MG tablet    Sig: Take 1 tablet (100 mg total) by mouth daily.    Dispense:  30 tablet    Refill:  3

## 2013-01-03 NOTE — Patient Instructions (Signed)
Your medications have been refilled; I have oredered a referral to Dr. Elvera Lennox at  [facility] for help managing your Diabetes and lipid disorder as well as SIADH (sodium abnormality). Continue medications and nutrition changes. I will see you again in 4 months or sooner if necessary.

## 2013-01-10 ENCOUNTER — Telehealth: Payer: Self-pay

## 2013-01-10 MED ORDER — RANITIDINE HCL 150 MG PO TABS: 150.0000 mg | ORAL_TABLET | Freq: Two times a day (BID) | ORAL | Status: DC

## 2013-01-10 NOTE — Telephone Encounter (Signed)
DAWN FROM [facility] ON [address] STATES THAT THEY FAXED A REFILL REQUEST AND WAS TOLD THAT IT WOULD HAVE TO BE SENT ELECTRONICALLY BUT PT HAS NOT HAD THE MEDICATION FILLED THERE BEFORE SO THEY ARE UNABLE TO SEND THE REQUEST ELECTRONICALLY, THE MEDICATION NEEDING TO BE REFILLED IS RANITIDINE.  BEST# [phone]

## 2013-01-10 NOTE — Telephone Encounter (Signed)
Rx sent to pharmacy   

## 2013-01-29 ENCOUNTER — Ambulatory Visit: Payer: BC Managed Care – PPO | Admitting: Internal Medicine

## 2013-02-09 ENCOUNTER — Other Ambulatory Visit: Payer: Self-pay | Admitting: Physician Assistant

## 2013-02-16 ENCOUNTER — Ambulatory Visit (INDEPENDENT_AMBULATORY_CARE_PROVIDER_SITE_OTHER): Payer: BC Managed Care – PPO | Admitting: Internal Medicine

## 2013-02-16 ENCOUNTER — Encounter: Payer: Self-pay | Admitting: Internal Medicine

## 2013-02-16 VITALS — BP 120/80 | HR 84 | Temp 98.2°F | Ht 65.0 in | Wt 210.0 lb

## 2013-02-16 DIAGNOSIS — E785 Hyperlipidemia, unspecified: Secondary | ICD-10-CM

## 2013-02-16 LAB — COMPREHENSIVE METABOLIC PANEL
ALT: 23 U/L (ref 0–35)
AST: 16 U/L (ref 0–37)
Albumin: 4.3 g/dL (ref 3.5–5.2)
BUN: 9 mg/dL (ref 6–23)
CO2: 26 mEq/L (ref 19–32)
Calcium: 9.8 mg/dL (ref 8.4–10.5)
Chloride: 93 mEq/L — ABNORMAL LOW (ref 96–112)
Potassium: 4 mEq/L (ref 3.5–5.1)

## 2013-02-16 LAB — LIPID PANEL: Triglycerides: 636 mg/dL — ABNORMAL HIGH (ref 0.0–149.0)

## 2013-02-16 LAB — HEMOGLOBIN A1C: Hgb A1c MFr Bld: 10.9 % — ABNORMAL HIGH (ref 4.6–6.5)

## 2013-02-16 LAB — LDL CHOLESTEROL, DIRECT: Direct LDL: 86.8 mg/dL

## 2013-02-16 MED ORDER — "PEN NEEDLES 3/16"" 31G X 5 MM MISC": 1.0000 | Freq: Every day | Status: DC

## 2013-02-16 MED ORDER — LANTUS SOLOSTAR 100 UNIT/ML ~~LOC~~ SOPN: 18.0000 [IU] | PEN_INJECTOR | Freq: Every day | SUBCUTANEOUS | Status: DC

## 2013-02-16 NOTE — Progress Notes (Signed)
Patient ID: Carolyn Butler, female   DOB: 07/19/1971, 42 y.o.   MRN: [id number]  HPI: Carolyn Butler is a 42 y.o.-year-old female, referred by her PCP, Dr. Audria Nine for management of DM2, non-insulin-dependent, uncontrolled, without complications.  Patient has been diagnosed with diabetes in 2005; she has not been on insulin before. She has a h/o med noncompliance per PCPs notes.  Last hemoglobin A1c was: Lab Results  Component Value Date   HGBA1C 10.4 09/18/2012  She started to take Januvia after the HbA1c returned this high. Previous A1c was 8.4% in 06/2012.   Pt is on a regimen of: - Metformin 1000 mg po bid - Januvia 100 mg She was on Amaryl in the past and gained 15 lbs >> stopped it and lost the weight  Pt checks her sugars 2x a day and they are: - am: 275-330 - before lunch: 250s - before dinner: not checking No lows. Lowest sugar was 250s; she has hypoglycemia awareness at 150. Highest sugar was 390.  Pt has been working out more after she stopped working and tries to eat right.  Pt's meals were - per nutrition note 10/05/2012 - now improved. 24-hr recall:  B ( AM): Canadian bacon with 1 bread and fried egg and cheese, coffee with cream and 3-4 tsp sugar OR if husband cooks- grits, bacon, eggs, or pancakes, coffee  Snk ( AM): rarely  L ( PM): brings or buys - left overs from home OR Chipotle chic salad with sour cream, cheese and dressing, water or grapefruit Zazz drink  Snk ( PM): Almonds or yogurt or 1/2 apple  D ( PM): eat out often; prepares about 3 meals a week: meat, starch and veg type meal, salad, water  Snk ( PM): occasional sweet food like cookies if in the house or frozen yogurt   Beverages: coffee, water, no sodas  Pt does not have chronic kidney disease, last BUN/creatinine was:  Lab Results  Component Value Date   BUN 6 12/14/2012   CREATININE 0.59 12/14/2012  On Losartan.  Last set of lipids - nonfasting: Lab Results  Component Value Date   CHOL 488*  12/14/2012   HDL 22* 12/14/2012   LDLCALC NOT CALC 12/14/2012   TRIG 4725* 12/14/2012   CHOLHDL 22.2 12/14/2012  She was on omega 3 FA for 4 years - 1 g bid. She was started 600 mg bid after the above lipid panel returned. She thinks her father and PGM have HTG.   Pt's last eye exam was in 2012. No DR. Has morning blurry vision in left eye. Denies numbness and tingling in her legs. Has cramping in the legs  I reviewed her chart and she also has a history of HTN, HL, obesity class 2. She also has irregular menstrual cycles.  Pt has FH of DM in father GM, brother.  She was told she had low sodium. She was advised to increase Na  > she started to increased the sodium in diet and also took some Na tablets >> leg swelling.   ROS: Constitutional: no weight gain/loss, no fatigue,+subjective hyperthermia, nocturia, increased urination Eyes: no blurry vision, no xerophthalmia ENT: no sore throat, no nodules palpated in throat, no dysphagia/odynophagia, no hoarseness Cardiovascular: no CP/SOB/palpitations/+ leg swelling Respiratory: no cough/SOB Gastrointestinal: no N/V/D/+C, + GERD Musculoskeletal: no muscle/joint aches Skin: no rashes; + itching Neurological: no tremors/numbness/tingling/dizziness Psychiatric: no depression/anxiety Low libido, irreg menstrual cycles Past Medical History  Diagnosis Date  . Diabetes mellitus without complication   .  Hypertension    Past Surgical History  Procedure Laterality Date  . Surgery on right knee     History   Social History  . Marital Status: Married    Spouse Name: N/A    Number of Children: 0 -G2P0 (2 miscarriages)   Occupational History  . unemployed   Social History Main Topics  . Smoking status: Never Smoker   . Smokeless tobacco: Never Used  . Alcohol Use: No     Comment: twice a year mixed drink or red wine  . Drug Use: No  . Sexually Active: Yes   Current Outpatient Prescriptions on File Prior to Visit  Medication Sig Dispense  Refill  . aspirin 81 MG tablet Take 81 mg by mouth daily.      . cyclobenzaprine (FLEXERIL) 10 MG tablet TAKE ONE TABLET BY MOUTH AT BEDTIME AS NEEDED FOR MUSCLE SPASM  90 tablet  0  . gemfibrozil (LOPID) 600 MG tablet Take 1 tablet (600 mg total) by mouth 2 (two) times daily before a meal.  60 tablet  5  . losartan (COZAAR) 50 MG tablet TAKE ONE TABLET BY MOUTH EVERY DAY  30 tablet  5  . metFORMIN (GLUCOPHAGE) 1000 MG tablet TAKE ONE TABLET BY MOUTH TWICE DAILY WITH MEALS  60 tablet  3  . Multiple Vitamin (MULTIVITAMIN) tablet Take 1 tablet by mouth daily.      . Omega-3 Fatty Acids (OMEGA 3 PO) Take by mouth 2 (two) times daily with a meal.      . ranitidine (ZANTAC) 150 MG tablet TAKE ONE TABLET BY MOUTH TWICE DAILY FOR STOMACH SYMPTOMS  60 tablet  0  . VITAMIN E PO Take by mouth daily.       No current facility-administered medications on file prior to visit.   Allergies  Allergen Reactions  . Doxycycline    Family History  Problem Relation Age of Onset  . Diabetes Father   . Diabetes Paternal Grandmother   . Heart disease Paternal Grandmother   . Diabetes Brother    PE: BP 120/80  Pulse 84  Temp(Src) 98.2 F (36.8 C) (Oral)  Ht 5\' 5"  (1.651 m)  Wt 210 lb (95.255 kg)  BMI 34.95 kg/m2 Wt Readings from Last 3 Encounters:  02/16/13 210 lb (95.255 kg)  01/03/13 213 lb (96.616 kg)  12/14/12 216 lb (97.977 kg)   Constitutional: overweight, in NAD Eyes: PERRLA, EOMI, no exophthalmos; no lipemia retinalis noted bilaterally ENT: moist mucous membranes, no thyromegaly, no cervical lymphadenopathy Cardiovascular: RRR, No MRG Respiratory: CTA B Gastrointestinal: abdomen soft, NT, ND, BS+ Musculoskeletal: no deformities, strength intact in all 4 Skin: moist, warm, no rashes. No tendon xanthomas. Neurological: no tremor with outstretched hands, DTR normal in all 4  ASSESSMENT: 1. DM2, non-insulin-dependent, uncontrolled, without complications  2. HTG  3. Pseudonatremia -  2/2 hypertriglyceridemia  PLAN:  1.  Patient with uncontrolled DM 2, with increasing hemoglobin A1c (however this value can be influenced by the high triglycerides). She describes sugars that are usually in the 200s to 300s. She was started recently on Januvia, however, with her history of very high triglycerides, in the pancreatitis range, and a recent episode of epigastric pain with an increased lipase at 82 (less than 75), it is dangerous to continue Januvia, in the setting of high risk for pancreatitis. I advised her to stop the medication today. - I advised her to continue metformin 1000 mg twice a day - we discussed about the fact that the  only medication that I can suggest right now based on her high sugars and her high triglycerides his insulin, and she agrees to start on Lantus 18 units at bedtime. We discussed about the use of a pen, storage insulin injection technique, storage of insulin, and I sent a prescription for this to her pharmacy. - given sugar log and advised how to fill it and to bring it at next appt - checks 3 times a day, rotating check times - given foot care handout and explained the principles - given instructions for hypoglycemia management "15-15 rule" - check hemoglobin A1c today - I would see her back in one month with her sugar log  2. Hypertriglyceridemia - At last check, 2 months ago, triglycerides were 4500. At that time she was on omega-3 fatty acids 1 g 2 times a day. She was then started on Lopid 600 mg twice a day. She is taking this now. I advised her to continue with the above medicines, however to increase the omega-3 fatty acids to 3 g a day. - we also discussed at length about diet, and I suggested several changes to reduce the fat and carbs in her diet - advised to skip bacon, fried eggs, sour cream - also please see patient instruction section - we'll check a triglyceride level today, since she is fasting. We'll also add liver function tests. - advised  her to stay away from steroids and oral contraceptives, since they can increase triglycerides; she is not drinking alcohol, but once a month  3. Pseudonatremia - this is most likely due to her high triglyceride level. Advised her to reduce her dietary sodium to normal, and to stop the sodium tablets. I will take off the SIADH diagnosis from her list.  Office Visit on 02/16/2013  Component Date Value Range Status  . Sodium 02/16/2013 130* 135 - 145 mEq/L Final  . Potassium 02/16/2013 4.0  3.5 - 5.1 mEq/L Final  . Chloride 02/16/2013 93* 96 - 112 mEq/L Final  . CO2 02/16/2013 26  19 - 32 mEq/L Final  . Glucose, Bld 02/16/2013 317* 70 - 99 mg/dL Final  . BUN 16/04/9603 9  6 - 23 mg/dL Final  . Creatinine, Ser 02/16/2013 0.5  0.4 - 1.2 mg/dL Final  . Total Bilirubin 02/16/2013 0.6  0.3 - 1.2 mg/dL Final  . Alkaline Phosphatase 02/16/2013 29* 39 - 117 U/L Final  . AST 02/16/2013 16  0 - 37 U/L Final  . ALT 02/16/2013 23  0 - 35 U/L Final  . Total Protein 02/16/2013 8.1  6.0 - 8.3 g/dL Final  . Albumin 54/03/8118 4.3  3.5 - 5.2 g/dL Final  . Calcium 14/78/2956 9.8  8.4 - 10.5 mg/dL Final  . GFR 21/30/8657 143.30  >60.00 mL/min Final  . Cholesterol 02/16/2013 203* 0 - 200 mg/dL Final   ATP III Classification       Desirable:  < 200 mg/dL               Borderline High:  200 - 239 mg/dL          High:  > = 846 mg/dL  . Triglycerides 02/16/2013 636.0* 0.0 - 149.0 mg/dL Final   Normal:  <962 mg/dLBorderline High:  150 - 199 mg/dLTriglyceride is over 400; calculations on Lipids are invalid.  Marland Kitchen HDL 02/16/2013 34.60* >39.00 mg/dL Final  . VLDL 95/28/4132 127.2* 0.0 - 40.0 mg/dL Final  . Total CHOL/HDL Ratio 02/16/2013 6   Final  Men          Women1/2 Average Risk     3.4          3.3Average Risk          5.0          4.42X Average Risk          9.6          7.13X Average Risk          15.0          11.0                      . Hemoglobin A1C 02/16/2013 10.9* 4.6 - 6.5 % Final    Glycemic Control Guidelines for People with Diabetes:Non Diabetic:  <6%Goal of Therapy: <7%Additional Action Suggested:  >8%   . Direct LDL 02/16/2013 86.8   Final   Optimal:  <100 mg/dLNear or Above Optimal:  100-129 mg/dLBorderline High:  130-159 mg/dLHigh:  160-189 mg/dLVery High:  >190 mg/dL   TG improved. Normal LFTs. Very poor DM control.

## 2013-02-16 NOTE — Patient Instructions (Addendum)
Please return in 1 month with your sugar log.  Please stop Januvia. Start Lantus, inject 18 units under skin at bed time. Continue metformin. Increase the omega-3 fatty acids to 3 g a day. Continue fenofibrate.  Please consider the following ways to cut down carbs and fat and increase fiber and micronutrients in your diet: - substitute whole grain for white bread or pasta - substitute brown rice for white rice - substitute 90-calorie flat bread pieces for slices of bread when possible - substitute sweet potatoes or yams for white potatoes - substitute humus for margarine - substitute tofu for cheese when possible - substitute almond or rice milk for regular milk (would not drink soy milk daily due to concern for soy estrogen influence on breast cancer risk) - substitute dark chocolate for other sweets when possible - substitute water - can add lemon or orange slices for taste - for diet sodas (artificial sweeteners will trick your body that you can eat sweets without getting calories and will lead you to overeating and weight gain in the long run) - do not skip breakfast or other meals (this will slow down the metabolism and will result in more weight gain over time)  - can try smoothies made from fruit and almond/rice milk in am instead of regular breakfast - can also try old-fashioned (not instant) oatmeal made with almond/rice milk in am - order the dressing on the side when eating salad at a restaurant (pour less than half of the dressing on the salad) - eat as little meat as possible - can try juicing, but should not forget that juicing will get rid of the fiber, so would alternate with eating raw veg./fruits or drinking smoothies - use as little oil as possible, even when using olive oil - can dress a salad with a mix of balsamic vinegar and lemon juice, for e.g. - use agave nectar, stevia sugar, or regular sugar rather than artificial sweateners - steam or broil/roast veggies  -  snack on veggies/fruit/nuts (unsalted, preferably) when possible, rather than processed foods - reduce or eliminate aspartame in diet (it is in diet sodas, chewing gum, etc) Read the labels!  Try to read Dr. Katherina Right book: "Program for Reversing Diabetes" for the vegan concept and other ideas for healthy eating.   PATIENT INSTRUCTIONS FOR TYPE 2 DIABETES:  **Please join MyChart!** - see attached instructions about how to join   DIET AND EXERCISE Diet and exercise is an important part of diabetic treatment.  We recommended aerobic exercise in the form of brisk walking (working between 40-60% of maximal aerobic capacity, similar to brisk walking) for 150 minutes per week (such as 30 minutes five days per week) along with 3 times per week performing 'resistance' training (using various gauge rubber tubes with handles) 5-10 exercises involving the major muscle groups (upper body, lower body and core) performing 10-15 repetitions (or near fatigue) each exercise. Start at half the above goal but build slowly to reach the above goals. If limited by weight, joint pain, or disability, we recommend daily walking in a swimming pool with water up to waist to reduce pressure from joints while allow for adequate exercise.    BLOOD GLUCOSES Monitoring your blood glucoses is important for continued management of your diabetes. Please check your blood glucoses 2-4 times a day: fasting, before meals and at bedtime (you can rotate these measurements - e.g. one day check before the 3 meals, the next day check before 2 of the  meals and before bedtime, etc.   HYPOGLYCEMIA (low blood sugar) Hypoglycemia is usually a reaction to not eating, exercising, or taking too much insulin/ other diabetes drugs.  Symptoms include tremors, sweating, hunger, confusion, headache, etc. Treat IMMEDIATELY with 15 grams of Carbs:   4 glucose tablets    cup regular juice/soda   2 tablespoons raisins   4 teaspoons sugar   1  tablespoon honey Recheck blood glucose in 15 mins and repeat above if still symptomatic/blood glucose <100. Please contact our office at [phone] if you have questions about how to next handle your insulin.  RECOMMENDATIONS TO REDUCE YOUR RISK OF DIABETIC COMPLICATIONS: * Take your prescribed MEDICATION(S). * Follow a DIABETIC diet: Complex carbs, fiber rich foods, heart healthy fish twice weekly, (monounsaturated and polyunsaturated) fats * AVOID saturated/trans fats, high fat foods, >2,300 mg salt per day. * EXERCISE at least 5 times a week for 30 minutes or preferably daily.  * DO NOT SMOKE OR DRINK more than 1 drink a day. * Check your FEET every day. Do not wear tightfitting shoes. Contact us if you develop an ulcer * See your EYE doctor once a year or more if needed * Get a FLU shot once a year * Get a PNEUMONIA vaccine once before and once after age 27 years  GOALS:  * Your Hemoglobin A1c of <7%  * fasting sugars need to be <130 * after meals sugars need to be <180 (2h after you start eating) * Your Systolic BP should be 140 or lower  * Your Diastolic BP should be 80 or lower  * Your HDL (Good Cholesterol) should be 40 or higher  * Your LDL (Bad Cholesterol) should be 100 or lower  * Your Triglycerides should be 150 or lower  * Your Urine microalbumin (kidney function) should be <30 * Your Body Mass Index should be 25 or lower   We will be glad to help you achieve these goals. Our telephone number is: [phone].

## 2013-02-18 ENCOUNTER — Encounter: Payer: Self-pay | Admitting: Internal Medicine

## 2013-03-05 ENCOUNTER — Other Ambulatory Visit: Payer: Self-pay | Admitting: Physician Assistant

## 2013-05-09 ENCOUNTER — Ambulatory Visit (INDEPENDENT_AMBULATORY_CARE_PROVIDER_SITE_OTHER): Payer: BC Managed Care – PPO | Admitting: Family Medicine

## 2013-05-09 ENCOUNTER — Encounter: Payer: Self-pay | Admitting: Family Medicine

## 2013-05-09 VITALS — BP 134/86 | HR 94 | Temp 99.0°F | Resp 16 | Ht 65.0 in | Wt 205.8 lb

## 2013-05-09 DIAGNOSIS — M25579 Pain in unspecified ankle and joints of unspecified foot: Secondary | ICD-10-CM

## 2013-05-09 DIAGNOSIS — M25572 Pain in left ankle and joints of left foot: Secondary | ICD-10-CM

## 2013-05-09 DIAGNOSIS — K219 Gastro-esophageal reflux disease without esophagitis: Secondary | ICD-10-CM

## 2013-05-09 DIAGNOSIS — E785 Hyperlipidemia, unspecified: Secondary | ICD-10-CM

## 2013-05-09 DIAGNOSIS — Z23 Encounter for immunization: Secondary | ICD-10-CM

## 2013-05-09 LAB — LIPID PANEL
Total CHOL/HDL Ratio: 6.1 Ratio
Triglycerides: 790 mg/dL — ABNORMAL HIGH (ref <150)

## 2013-05-09 LAB — BASIC METABOLIC PANEL
Chloride: 95 mEq/L — ABNORMAL LOW (ref 96–112)
Creat: 0.44 mg/dL — ABNORMAL LOW (ref 0.50–1.10)
Potassium: 4.2 mEq/L (ref 3.5–5.3)
Sodium: 132 mEq/L — ABNORMAL LOW (ref 135–145)

## 2013-05-09 LAB — POCT GLYCOSYLATED HEMOGLOBIN (HGB A1C): Hemoglobin A1C: 10

## 2013-05-09 MED ORDER — LOSARTAN POTASSIUM 50 MG PO TABS: ORAL_TABLET | ORAL | Status: DC

## 2013-05-09 MED ORDER — METFORMIN HCL 1000 MG PO TABS: ORAL_TABLET | ORAL | Status: DC

## 2013-05-09 MED ORDER — OMEPRAZOLE 20 MG PO CPDR: 20.0000 mg | DELAYED_RELEASE_CAPSULE | Freq: Every day | ORAL | Status: DC

## 2013-05-09 MED ORDER — GEMFIBROZIL 600 MG PO TABS: 600.0000 mg | ORAL_TABLET | Freq: Two times a day (BID) | ORAL | Status: DC

## 2013-05-09 NOTE — Patient Instructions (Addendum)
Diet for Gastroesophageal Reflux Disease, Adult Reflux (acid reflux) is when acid from your stomach flows up into the esophagus. When acid comes in contact with the esophagus, the acid causes irritation and soreness (inflammation) in the esophagus. When reflux happens often or so severely that it causes damage to the esophagus, it is called gastroesophageal reflux disease (GERD). Nutrition therapy can help ease the discomfort of GERD. FOODS OR DRINKS TO AVOID OR LIMIT  Smoking or chewing tobacco. Nicotine is one of the most potent stimulants to acid production in the gastrointestinal tract.  Caffeinated and decaffeinated coffee and black tea.  Regular or low-calorie carbonated beverages or energy drinks (caffeine-free carbonated beverages are allowed).   Strong spices, such as black pepper, white pepper, red pepper, cayenne, curry powder, and chili powder.  Peppermint or spearmint.  Chocolate.  High-fat foods, including meats and fried foods. Extra added fats including oils, butter, salad dressings, and nuts. Limit these to less than 8 tsp per day.  Fruits and vegetables if they are not tolerated, such as citrus fruits or tomatoes.  Alcohol.  Any food that seems to aggravate your condition. If you have questions regarding your diet, call your caregiver or a registered dietitian. OTHER THINGS THAT MAY HELP GERD INCLUDE:   Eating your meals slowly, in a relaxed setting.  Eating 5 to 6 small meals per day instead of 3 large meals.  Eliminating food for a period of time if it causes distress.  Not lying down until 3 hours after eating a meal.  Keeping the head of your bed raised 6 to 9 inches (15 to 23 cm) by using a foam wedge or blocks under the legs of the bed. Lying flat may make symptoms worse.  Being physically active. Weight loss may be helpful in reducing reflux in overweight or obese adults.  Wear loose fitting clothing EXAMPLE MEAL PLAN This meal plan is approximately  2,000 calories based on https://www.[facility].org/ meal planning guidelines. Breakfast   cup cooked oatmeal.  1 cup strawberries.  1 cup low-fat milk.  1 oz almonds. Snack  1 cup cucumber slices.  6 oz yogurt (made from low-fat or fat-free milk). Lunch  2 slice whole-wheat bread.  2 oz sliced [place].  2 tsp mayonnaise.  1 cup blueberries.  1 cup snap peas. Snack  6 whole-wheat crackers.  1 oz string cheese. Dinner   cup brown rice.  1 cup mixed veggies.  1 tsp olive oil.  3 oz grilled fish. Document Released: 07/12/2005 Document Revised: 10/04/2011 Document Reviewed: 05/28/2011 [facility] Patient Information 2014 [place], [place].   CONTACT DR. GHERGHE'S OFFICE TO SCHEDULE A FOLLOW-UP APPOINTMENT. UNTIL YOU SEE HER, INCREASE YOUR INSULIN TO 22 UNITS EVERY NIGHT.  I have prescribed Omeprazole for your indigestion and heartburn. Take this medication every morning before your first meal. Your Vitamin D level last year was 39; optimum level is ~ 50. Get a good multivitamin (try the Gummies if you have trouble swallowing the big pill) that has extra Vitamin D3 and take it daily.

## 2013-05-09 NOTE — Progress Notes (Signed)
S:  This 42 y.o. Cauc female has Type II DM, treated w/ Metformin and Lantus Insulin. Dr. Elvera Lennox initiated Insulin at pt's first visit with her. Pt has not scheduled a follow-up. She has not been checking FSBS daily; recent vacation in [place] resulted in lapse in diabetic management. Pt was off her diet and did not monitor sugars. She has mild fatigue but denies weakness, polyuria or excessive hunger. She notes some weight loss. She has chronic heartburn treated w/ OTC ranitidine for years. This medication no longer relieves epig discomfort and indigestion. Pt denies any change in stools.  Pt  also c/o L lateral ankle pain for 2-3 weeks. No specific trauma but has noticed discomfort when wearing heels and when she goes up and down steps. Today, she has no painful area that she can pinpoint. There has been no swelling or redness.   Patient Active Problem List   Diagnosis Date Noted  . Pseudohyponatremia 12/14/2012  . Other and unspecified hyperlipidemia 12/14/2012  . Type II or unspecified type diabetes mellitus without mention of complication, uncontrolled 11/20/2011  . Benign essential HTN 11/20/2011  . Obesity, Class II, BMI 35-39.9, with comorbidity 11/20/2011  . Sleep disturbance 11/20/2011   PMHx, Soc Hx and Fam Hx reviewed. Medications reconciled.  ROS: As per HPI; otherwise unremarkable.  O: Filed Vitals:   05/09/13 0829  BP: 134/86  Pulse: 94  Temp: 99 F (37.2 C)  Resp: 16   GEN: In NAD: WN,WD. HENT: [place]/AT; EOMI w/ clear conj/sclerae. Psot ph erythematous w/o lesions or exudate. COR; RRR. No edema. LUNGS: CTA: normal resp rate and effort. BACK: No CVAT. ABD: Flat and soft; no guarding. Mild epig tenderness w/o masses or HSM. SKIN: W&D; intact w/o rashes, erythema, jaundice or pallor. MS: MAEs; no L heel pain or erythema. No visible deformity. NEURO: A&O x 3; Cns intact. Nonfocal.  A1c= 10.0%  A/P: Type II or unspecified type diabetes mellitus without mention of  complication, uncontrolled - Schedule follow-up w/ Dr. Elvera Lennox and increase Lantus to 22 units pending follow-up. Plan: POCT glycosylated hemoglobin (Hb A1C), Basic metabolic panel  GERD (gastroesophageal reflux disease) - Plan: H. pylori antibody, IgG; Trial PPI- omeprazole 20 mg 1 tab every morning.  Pain in joint, ankle and foot, left- Suspect tendonitis. Try ankle support; if problem persists, return for re-evaluation.  Other and unspecified hyperlipidemia - Plan: Lipid panel  Need for prophylactic vaccination and inoculation against influenza - Plan: CANCELED: Flu Vaccine QUAD 36+ mos IM

## 2013-05-12 NOTE — Progress Notes (Signed)
Quick Note:  Please advise pt regarding following labs... Lipid profile shows high triglycerides; this is a reflection of poor Diabetes control. You are taking a medication to lower cholesterol and triglycerides but it is not as effective as I would like. It is extremely important that you focus on better nutrition and regular exercise to help improve Diabetes control. I may need to try a different medication to lower your triglycerides. HDL ("good") cholesterol is still below normal; your risk of heart disease is above average.  Dr. Elvera Lennox (the Diabetes specialist) may have some suggestions about treatment of your lipid disorder. I will defer to her; please contact her office to schedule a follow-up with her.  The test for excess bacteria in your stomach is negative.  Copy to pt. ______

## 2013-05-18 ENCOUNTER — Telehealth: Payer: Self-pay

## 2013-05-18 NOTE — Telephone Encounter (Signed)
Dr. Audria Nine    Meds are giving patient headaches and nausea. Past four days   omeprazole (PRILOSEC) 20 MG capsule   [facility] on [address].   [phone]

## 2013-05-18 NOTE — Telephone Encounter (Signed)
I have advised patient 

## 2013-05-18 NOTE — Telephone Encounter (Signed)
Advise pt to stop taking this medication; she can just try TUMS for indigestion and mild stomach upset.

## 2013-09-12 ENCOUNTER — Ambulatory Visit: Payer: BC Managed Care – PPO | Admitting: Family Medicine

## 2013-09-21 ENCOUNTER — Encounter: Payer: Self-pay | Admitting: Internal Medicine

## 2013-09-21 ENCOUNTER — Ambulatory Visit (INDEPENDENT_AMBULATORY_CARE_PROVIDER_SITE_OTHER): Payer: BC Managed Care – PPO | Admitting: Internal Medicine

## 2013-09-21 VITALS — BP 116/74 | HR 88 | Temp 97.9°F | Resp 12 | Wt 208.0 lb

## 2013-09-21 DIAGNOSIS — IMO0001 Reserved for inherently not codable concepts without codable children: Secondary | ICD-10-CM

## 2013-09-21 DIAGNOSIS — E1165 Type 2 diabetes mellitus with hyperglycemia: Principal | ICD-10-CM

## 2013-09-21 LAB — HEMOGLOBIN A1C: Hgb A1c MFr Bld: 9.3 % — ABNORMAL HIGH (ref 4.6–6.5)

## 2013-09-21 MED ORDER — INSULIN PEN NEEDLE 32G X 4 MM MISC: Status: DC

## 2013-09-21 MED ORDER — INSULIN ASPART 100 UNIT/ML FLEXPEN: PEN_INJECTOR | SUBCUTANEOUS | Status: DC

## 2013-09-21 NOTE — Progress Notes (Signed)
Patient ID: Carolyn Butler, female   DOB: 08/31/1970, 43 y.o.   MRN: [id number]  HPI: Carolyn Butler is a 43 y.o.-year-old female, returning for f/u for DM2, dx 2005, insulin-dependent since 01/2013, uncontrolled, without complications. Last visit was in 01/2013 (7 mo ago). She did not return in 1 mo as advised then.  Last hemoglobin A1c was: Lab Results  Component Value Date   HGBA1C 10.0 05/09/2013  She started to take Januvia after the HbA1c returned this high. Previous A1c was 8.4% in 06/2012.  She has a h/o med noncompliance per PCPs notes.    Pt is on a regimen of: - Metformin 1000 mg po bid - Lantus 18 >> 25 units added at last visit in 01/2013. She was on Amaryl in the past and gained 15 lbs >> stopped it and lost the weight We stopped Januvia 100 mg in 01/2013 b/c increased TG and high risk of Pancreatitis.   Pt checks her sugars 0-1 x a day and they are: - am: 275-330 >> 264-301 - before lunch: 250s >> n/c - 3 pm: 282 one check in August - before dinner: not checking  >> one check 321, 388 (in 01/2014) No lows. Lowest sugar was 260; she has hypoglycemia awareness at 150. Highest sugar was 388.  Meals:  - b'fast: 1 egg + toast - lunch: salad + meat or fish - dinner: spaghetti, adds veggies or salads but not always Smaller portions.   She started to work out every day except in the last 2 weeks.  Pt does not have chronic kidney disease, last BUN/creatinine was:  Lab Results  Component Value Date   BUN 9 05/09/2013   CREATININE 0.44* 05/09/2013  On Losartan.  Last set of lipids - nonfasting: Lab Results  Component Value Date   CHOL 170 05/09/2013   HDL 28* 05/09/2013   LDLCALC Comment:   Not calculated due to Triglyceride >400.  05/09/2013   LDLDIRECT 86.8 02/16/2013   TRIG 790* 05/09/2013   CHOLHDL 6.1 05/09/2013  She was on omega 3 FA for 4 years - 1 g bid, then stopped. I advised her to restart and increase to 3 /day - she is taking it now. She is also on 600  mg bid >> TG decreased from 4500 to 790.  Pt's last eye exam was in 2012. No DR. Has morning blurry vision in left eye.  Denies numbness and tingling in her legs.   I reviewed her chart and she also has a history of HTN, HL, obesity class 2. She also has irregular menstrual cycles.  ROS: Constitutional: no weight gain/loss, no fatigue,no subjective hyperthermia Eyes: no blurry vision, no xerophthalmia ENT: no sore throat, no nodules palpated in throat, no dysphagia/odynophagia, no hoarseness Cardiovascular: no CP/SOB/palpitations/leg swelling Respiratory: no cough/SOB Gastrointestinal: no N/V/D/C/GERD Musculoskeletal: no muscle/joint aches Skin: no rashes Neurological: no tremors/numbness/tingling/dizziness  I reviewed pt's medications, allergies, PMH, social hx, family hx and no changes required, except as mentioned above.  PE: BP 116/74  Pulse 88  Temp(Src) 97.9 F (36.6 C) (Oral)  Resp 12  Wt 208 lb (94.348 kg)  SpO2 97% Wt Readings from Last 3 Encounters:  09/21/13 208 lb (94.348 kg)  05/09/13 205 lb 12.8 oz (93.35 kg)  02/16/13 210 lb (95.255 kg)   Constitutional: overweight, in NAD Eyes: PERRLA, EOMI, no exophthalmos ENT: moist mucous membranes, no thyromegaly, no cervical lymphadenopathy Cardiovascular: RRR, No MRG Respiratory: CTA B Gastrointestinal: abdomen soft, NT, ND, BS+ Musculoskeletal: no deformities,  strength intact in all 4 Skin: moist, warm, no rashes. No tendon xanthomas. Neurological: no tremor with outstretched hands, DTR normal in all 4  ASSESSMENT: 1. DM2, insulin-dependent, uncontrolled, without complications  2. HTG  PLAN:  1.  Patient with uncontrolled DM 2, with sugars 200s to 300s, returning after an absence of 7 mo.  I advised her that checking her sugars is paramount in diabetes control. Unfortunately, I do not think we can decrease her sugars w/o insulin at this point... - I advised her to:  Patient Instructions  Please increase  Lantus to 30 units. Please start NovoLog:  - 7 units for a small meal  - 10 units for a larger meal Please inject the mealtime insulin (NovoLog) 15 min before a meal. Continue Metformin 1000 mg 2x a day. Please check sugar at least 3x a day: before a meal and at bedtime, rotating checks.  Please stop at the lab. - given a sample NovoLog pen - given an new One Touch Mini (Ultra) meter - given new sugar log - checks 3 times a day, rotating check times - advised that she needs a new eye exam - check hemoglobin A1c today - I would see her back in 1 month with her sugar log  2. Hypertriglyceridemia - on Lopid 600 mg twice a day, and  omega-3 fatty acids 3 g a day. - TG improved per last check  Office Visit on 09/21/2013  Component Date Value Ref Range Status  . Hemoglobin A1C 09/21/2013 9.3* 4.6 - 6.5 % Final   Glycemic Control Guidelines for People with Diabetes:Non Diabetic:  <6%Goal of Therapy: <7%Additional Action Suggested:  >8%    HbA1c a little better, but still very high.

## 2013-09-21 NOTE — Patient Instructions (Addendum)
Please increase Lantus to 30 units. Please start NovoLog:  - 7 units for a small meal  - 10 units for a larger meal Please inject the mealtime insulin (NovoLog) 15 min before a meal. Continue Metformin 1000 mg 2x a day. Please check sugar at least 3x a day: before a meal and at bedtime, rotating checks.  Please stop at the lab.

## 2013-10-19 ENCOUNTER — Ambulatory Visit: Payer: BC Managed Care – PPO | Admitting: Internal Medicine

## 2013-10-27 ENCOUNTER — Other Ambulatory Visit: Payer: Self-pay | Admitting: Family Medicine

## 2013-11-08 ENCOUNTER — Ambulatory Visit (INDEPENDENT_AMBULATORY_CARE_PROVIDER_SITE_OTHER): Payer: BC Managed Care – PPO | Admitting: Internal Medicine

## 2013-11-08 ENCOUNTER — Encounter: Payer: Self-pay | Admitting: Internal Medicine

## 2013-11-08 VITALS — BP 118/76 | HR 93 | Temp 98.4°F | Resp 12 | Wt 210.0 lb

## 2013-11-08 DIAGNOSIS — IMO0001 Reserved for inherently not codable concepts without codable children: Secondary | ICD-10-CM

## 2013-11-08 DIAGNOSIS — E1165 Type 2 diabetes mellitus with hyperglycemia: Principal | ICD-10-CM

## 2013-11-08 MED ORDER — INSULIN GLARGINE 100 UNIT/ML SOLOSTAR PEN: 30.0000 [IU] | PEN_INJECTOR | Freq: Every day | SUBCUTANEOUS | Status: DC

## 2013-11-08 NOTE — Progress Notes (Signed)
Patient ID: Carolyn Butler, female   DOB: [birth year], 43 y.o.   MRN: [id number]  HPI: Carolyn Butler is a 43 y.o.-year-old female, returning for f/u for DM2, dx 2005, insulin-dependent since 01/2013, uncontrolled, without complications. Last visit was 2 mo ago. She is here with her boyfriend.  Last hemoglobin A1c was: Lab Results  Component Value Date   HGBA1C 9.3* 09/21/2013  She started to take Januvia after the HbA1c returned this high. Previous A1c was 8.4% in 06/2012.  She has a h/o med noncompliance per PCPs notes.    Pt is on a regimen of: - Metformin 1000 mg po bid - Lantus 18 >> 25 >> 30 units  - NovoLog:  - 7 units for a small meal  - 10 units for a larger meal (takes this dose mostly) Pt ws on Amaryl in the past and gained 15 lbs >> stopped it and lost the weight We stopped Januvia 100 mg in 01/2013 b/c increased TG and high risk of Pancreatitis.   Pt checks her sugars 3x a day and they are IMPROVED: - am: 275-330 >> 264-301 >> 130-170 (187) - 2h after breakfast: n/c  - before lunch: 250s >> n/c >> 145-190 - 2h after lunch: 282 >> 260, 154, 294 - before dinner: not checking  >> one check 321, 388 (in 01/2014) >> n/c - bedtime: 198 No lows. Lowest sugar was 130; she has hypoglycemia awareness at 150. Highest sugar was 294.  Meals:  - b'fast: 1 egg + toast - lunch: salad + meat or fish - dinner: spaghetti, adds veggies or salads but not always Smaller portions.   She works out every day around 3-4 pm.  Pt does not have chronic kidney disease, last BUN/creatinine was:  Lab Results  Component Value Date   BUN 9 05/09/2013   CREATININE 0.44* 05/09/2013  On Losartan.  Last set of lipids - nonfasting: Lab Results  Component Value Date   CHOL 170 05/09/2013   HDL 28* 05/09/2013   LDLCALC Comment:   Not calculated due to Triglyceride >400.  05/09/2013   LDLDIRECT 86.8 02/16/2013   TRIG 790* 05/09/2013   CHOLHDL 6.1 05/09/2013  She was on omega 3 FA for 4 years - 1  g bid, then stopped. I advised her to restart and increase to 3 /day - she is taking it now. She is also on 600 mg bid >> TG decreased from 4500 to 790.  Pt's last eye exam was in 2012. No DR. Has morning blurry vision in left eye. She Denies numbness and tingling in her legs.   I reviewed her chart and she also has a history of HTN, HL, obesity class 2.   ROS: Constitutional: + weight gain, increased appetite, no fatigue, + subjective hyperthermia Eyes: no blurry vision, no xerophthalmia ENT: no sore throat, no nodules palpated in throat, no dysphagia/odynophagia, no hoarseness Cardiovascular: no CP/SOB/palpitations/leg swelling Respiratory: no cough/SOB Gastrointestinal: no N/V/D/C/GERD Musculoskeletal: no muscle/joint aches Skin: no rashes, + dry skin Neurological: no tremors/numbness/tingling/dizziness, + HA  I reviewed pt's medications, allergies, PMH, social hx, family hx and no changes required, except as mentioned above.  PE: BP 118/76  Pulse 93  Temp(Src) 98.4 F (36.9 C) (Oral)  Resp 12  Wt 210 lb (95.255 kg)  SpO2 98% Wt Readings from Last 3 Encounters:  11/08/13 210 lb (95.255 kg)  09/21/13 208 lb (94.348 kg)  05/09/13 205 lb 12.8 oz (93.35 kg)   Constitutional: overweight, in NAD Eyes: PERRLA,  EOMI, no exophthalmos ENT: moist mucous membranes, no thyromegaly, no cervical lymphadenopathy Cardiovascular: RRR, No MRG Respiratory: CTA B Gastrointestinal: abdomen soft, NT, ND, BS+ Musculoskeletal: no deformities, strength intact in all 4 Skin: moist, warm, no rashes. No tendon xanthomas. Neurological: no tremor with outstretched hands, DTR normal in all 4  ASSESSMENT: 1. DM2, insulin-dependent, uncontrolled, without complications  2. HTG  PLAN:  1.  Patient with uncontrolled DM 2, with improvement after adding mealtime insulin. - I advised her to:   Patient Instructions  - Continue Metformin 1000 mg 2x a day - Please keep Lantus at 30 units  - Please  increase NovoLog to:  - 10 units for a small meal  - 13 units for a larger meal  Use a B-complex vitamin daily.  Please return in 1.5 month with your sugar log.   - again advised that she needs a new eye exam - I would see her back in 1.5 month with her sugar log  2. Hypertriglyceridemia - on Lopid 600 mg twice a day, and  omega-3 fatty acids 3 g a day. - TG improved per last check

## 2013-11-08 NOTE — Patient Instructions (Signed)
-   Continue Metformin 1000 mg 2x a day - Please keep Lantus at 30 units  - Please increase NovoLog to:  - 10 units for a small meal  - 13 units for a larger meal  Use a B-complex vitamin daily.  Please return in 1.5 month with your sugar log.

## 2013-11-19 ENCOUNTER — Other Ambulatory Visit: Payer: Self-pay | Admitting: *Deleted

## 2013-11-19 DIAGNOSIS — E1165 Type 2 diabetes mellitus with hyperglycemia: Principal | ICD-10-CM

## 2013-11-19 DIAGNOSIS — IMO0001 Reserved for inherently not codable concepts without codable children: Secondary | ICD-10-CM

## 2013-11-19 MED ORDER — INSULIN PEN NEEDLE 32G X 4 MM MISC: Status: DC

## 2013-11-26 ENCOUNTER — Other Ambulatory Visit: Payer: Self-pay | Admitting: *Deleted

## 2013-11-26 ENCOUNTER — Telehealth: Payer: Self-pay | Admitting: *Deleted

## 2013-11-26 MED ORDER — GLUCOSE BLOOD VI STRP: ORAL_STRIP | Status: DC

## 2013-11-27 ENCOUNTER — Telehealth: Payer: Self-pay | Admitting: Internal Medicine

## 2013-11-27 NOTE — Telephone Encounter (Signed)
Returned pt's call and advised her to contact her ins co to see which meter and strips will they cover and we will send in an rx for them. Pt stated she would.

## 2013-11-27 NOTE — Telephone Encounter (Signed)
Patient states that her Rx for her Ultra One test strips  Her insurance is not covering; anything else she can get   Call back:[phone]  Thank You :)

## 2013-12-06 ENCOUNTER — Other Ambulatory Visit: Payer: Self-pay | Admitting: Physician Assistant

## 2013-12-20 ENCOUNTER — Ambulatory Visit: Payer: BC Managed Care – PPO | Admitting: Internal Medicine

## 2013-12-28 ENCOUNTER — Ambulatory Visit (INDEPENDENT_AMBULATORY_CARE_PROVIDER_SITE_OTHER): Payer: BC Managed Care – PPO | Admitting: Family Medicine

## 2013-12-28 ENCOUNTER — Encounter: Payer: Self-pay | Admitting: Family Medicine

## 2013-12-28 VITALS — BP 133/83 | HR 98 | Temp 98.1°F | Resp 18 | Ht 67.0 in | Wt 207.8 lb

## 2013-12-28 DIAGNOSIS — I10 Essential (primary) hypertension: Secondary | ICD-10-CM

## 2013-12-28 DIAGNOSIS — J209 Acute bronchitis, unspecified: Secondary | ICD-10-CM

## 2013-12-28 DIAGNOSIS — E785 Hyperlipidemia, unspecified: Secondary | ICD-10-CM

## 2013-12-28 MED ORDER — CEFUROXIME AXETIL 250 MG PO TABS: 250.0000 mg | ORAL_TABLET | Freq: Two times a day (BID) | ORAL | Status: DC

## 2013-12-28 MED ORDER — LOSARTAN POTASSIUM 50 MG PO TABS: 50.0000 mg | ORAL_TABLET | Freq: Every day | ORAL | Status: DC

## 2013-12-28 MED ORDER — METFORMIN HCL 1000 MG PO TABS: 1000.0000 mg | ORAL_TABLET | Freq: Two times a day (BID) | ORAL | Status: DC

## 2013-12-28 MED ORDER — GEMFIBROZIL 600 MG PO TABS: 600.0000 mg | ORAL_TABLET | Freq: Two times a day (BID) | ORAL | Status: DC

## 2014-01-01 ENCOUNTER — Ambulatory Visit (INDEPENDENT_AMBULATORY_CARE_PROVIDER_SITE_OTHER): Payer: BC Managed Care – PPO | Admitting: Internal Medicine

## 2014-01-01 ENCOUNTER — Encounter: Payer: Self-pay | Admitting: Internal Medicine

## 2014-01-01 VITALS — BP 122/74 | HR 97 | Temp 98.2°F | Resp 12 | Wt 210.0 lb

## 2014-01-01 DIAGNOSIS — IMO0001 Reserved for inherently not codable concepts without codable children: Secondary | ICD-10-CM

## 2014-01-01 DIAGNOSIS — E1165 Type 2 diabetes mellitus with hyperglycemia: Principal | ICD-10-CM

## 2014-01-01 MED ORDER — INSULIN GLARGINE 100 UNIT/ML SOLOSTAR PEN: 35.0000 [IU] | PEN_INJECTOR | Freq: Every day | SUBCUTANEOUS | Status: DC

## 2014-01-01 MED ORDER — INSULIN ASPART 100 UNIT/ML FLEXPEN: PEN_INJECTOR | SUBCUTANEOUS | Status: DC

## 2014-01-01 NOTE — Progress Notes (Signed)
Patient ID: Carolyn Butler, female   DOB: [birth year], 43 y.o.   MRN: [id number]  HPI: Carolyn Butler is a 43 y.o.-year-old female, returning for f/u for DM2, dx 2005, insulin-dependent since 01/2013, uncontrolled, without complications. Last visit was 2 mo ago.   She has sinus congestion >> started ABx.   Last hemoglobin A1c was: Lab Results  Component Value Date   HGBA1C 9.3* 09/21/2013  Previous A1c was 8.4% in 06/2012.  She has a h/o med noncompliance per PCPs notes.    Pt is on a regimen of: - Metformin 1000 mg po bid - Lantus 18 >> 25 >> 30 units  - NovoLog:  - 10 units for a small meal (takes this dose mostly) - 13 units for a larger meal  Pt ws on Amaryl in the past and gained 15 lbs >> stopped it and lost the weight We stopped Januvia 100 mg in 01/2013 b/c increased TG and high risk of Pancreatitis.   Pt checks her sugars 3x a day and they are ~ same as before: - am: 275-330 >> 264-301 >> 130-170 (187) >> 139-170 - 2h after breakfast: n/c >> 206, 240 - before lunch: 250s >> n/c >> 145-190 >> 148-185 - 2h after lunch: 282 >> 260, 154, 294 >> 147-199 - before dinner: not checking  >> one check 321, 388 (in 01/2014) >> n/c >> 135-206 - bedtime: 198 >> 158-259 No lows. Lowest sugar was 135; she has hypoglycemia awareness at 150. Highest sugar was 240.  Meals:  - b'fast: 1 egg + toast - lunch: salad + meat or fish - dinner: spaghetti, adds veggies or salads but not always Smaller portions.   She works out every day around 3-4 pm.  Pt does not have chronic kidney disease, last BUN/creatinine was:  Lab Results  Component Value Date   BUN 9 05/09/2013   CREATININE 0.44* 05/09/2013  On Losartan.  Last set of lipids - nonfasting: Lab Results  Component Value Date   CHOL 170 05/09/2013   HDL 28* 05/09/2013   LDLCALC Comment:   Not calculated due to Triglyceride >400.  05/09/2013   LDLDIRECT 86.8 02/16/2013   TRIG 790* 05/09/2013   CHOLHDL 6.1 05/09/2013  On omega 3  FA - 1 g 3 /day - she is taking it now. She is also on 600 mg bid >> TG decreased from 4500 to 790.  Pt's last eye exam was in 2012. No DR. Has morning blurry vision in left eye. She Denies numbness and tingling in her legs.   I reviewed her chart and she also has a history of HTN, HL, obesity class 2.   ROS: Constitutional: + weight gain, increased appetite, + fatigue, no subjective hyperthermia Eyes: no blurry vision, no xerophthalmia ENT: + sore throat, no nodules palpated in throat, + dysphagia/no odynophagia, no hoarseness Cardiovascular: no CP/SOB/palpitations/leg swelling Respiratory: no cough/SOB Gastrointestinal: no N/V/D/C/GERD Musculoskeletal: no muscle/joint aches Skin: no rashes, + hair loss Neurological: no tremors/numbness/tingling/dizziness, + HA  I reviewed pt's medications, allergies, PMH, social hx, family hx and no changes required, except as mentioned above.  PE: BP 122/74  Pulse 97  Temp(Src) 98.2 F (36.8 C) (Oral)  Resp 12  Wt 210 lb (95.255 kg)  SpO2 97% Wt Readings from Last 3 Encounters:  01/01/14 210 lb (95.255 kg)  12/28/13 207 lb 12.8 oz (94.257 kg)  11/08/13 210 lb (95.255 kg)   Constitutional: overweight, in NAD Eyes: PERRLA, EOMI, no exophthalmos ENT: moist mucous membranes,  no thyromegaly, no cervical lymphadenopathy Cardiovascular: RRR, No MRG Respiratory: CTA B Gastrointestinal: abdomen soft, NT, ND, BS+ Musculoskeletal: no deformities, strength intact in all 4 Skin: moist, warm, no rashes. No tendon xanthomas. Neurological: no tremor with outstretched hands, DTR normal in all 4  ASSESSMENT: 1. DM2, insulin-dependent, uncontrolled, without complications  2. HTG  PLAN:  1.  Patient with uncontrolled DM 2, with improvement after adding mealtime insulin, but still not at goal. - I advised her to increase insulin as follows:  Patient Instructions  - Continue Metformin 1000 mg 2x a day - Please increase Lantus to 35 units  - Please  increase NovoLog to:  - 13 units for a small meal  - 16 units for a larger meal Please return in 3 months with your sugar log.  - again advised that she needs a new eye exam  2. Hypertriglyceridemia - on Lopid 600 mg twice a day, and  omega-3 fatty acids 3 g a day. - TG improved per last check

## 2014-01-01 NOTE — Patient Instructions (Signed)
-   Continue Metformin 1000 mg 2x a day - Please increase Lantus to 35 units  - Please increase NovoLog to:  - 13 units for a small meal  - 16 units for a larger meal  Please return in 3 months with your sugar log.

## 2014-01-01 NOTE — Progress Notes (Signed)
Subjective:    Patient ID: Carolyn Butler, female    DOB: 25-Jan-1971, 43 y.o.   MRN: [id number]  HPI This 43 y.o. Cauc female last seen by me October 2014; she has Type II DM managed by Dr. Elvera [facility]ghe at [facility].  Pt presents to day w/ c/o slightly prod cough, mild wheezing and chest tightness. She has not documented a fever and denies chills, sore throat, rhinorrhea, SOB or CP. She has not taken any OTC products. She denies exposure to illness.  Pt has HTN and is compliant w/ medications. She does ot report any medication side effects. Pt denies fatigue, diaphoresis, vision disturbances, palpitations, DOE, HA, dizziness, numbness, weakness or syncope.  Pt has hx of elevated triglycerides, thought to be associated w/ uncontrolled DM.  Lipid panel in July 2014: TC= 203, HDL= 34.60, TGs= 636.0.  Repeat panel in Oct 2014: TC= 170, HDL= 28, TGs= 790 (LDL not calc due to high TGs). Need to check lipids today; pt has been fasting.  Patient Active Problem List   Diagnosis Date Noted  . Pseudohyponatremia 12/14/2012  . Other and unspecified hyperlipidemia 12/14/2012  . Type II or unspecified type diabetes mellitus without mention of complication, uncontrolled 11/20/2011  . Benign essential HTN 11/20/2011  . Obesity, Class II, BMI 35-39.9, with comorbidity 11/20/2011  . Sleep disturbance 11/20/2011   PMHx, Surg Hx, Soc and Fam Hx reviewed.  MEDICATIONS reconciled.   Review of Systems  Constitutional: Negative for fever, chills, diaphoresis, fatigue and unexpected weight change.  HENT: Positive for congestion and sinus pressure. Negative for ear pain, facial swelling, mouth sores, postnasal drip, rhinorrhea, sore throat and voice change.   Eyes: Negative for visual disturbance.  Respiratory: Positive for cough. Negative for chest tightness, shortness of breath and wheezing.   Cardiovascular: Negative for chest pain.  Musculoskeletal: Negative for arthralgias, myalgias and neck  stiffness.  Skin: Negative.   Neurological: Negative.   Psychiatric/Behavioral: Negative.       Objective:   Physical Exam  Nursing note and vitals reviewed. Constitutional: She is oriented to person, place, and time. She appears well-developed and well-nourished. No distress.  HENT:  Head: Normocephalic and atraumatic.  Right Ear: Hearing, tympanic membrane, external ear and ear canal normal.  Left Ear: Hearing, tympanic membrane, external ear and ear canal normal.  Nose: Mucosal edema present. No nasal deformity or septal deviation. Right sinus exhibits no maxillary sinus tenderness and no frontal sinus tenderness. Left sinus exhibits no maxillary sinus tenderness and no frontal sinus tenderness.  Mouth/Throat: Uvula is midline and mucous membranes are normal. No oral lesions. No uvula swelling. Posterior oropharyngeal erythema present. No oropharyngeal exudate.  Neck: Normal range of motion and full passive range of motion without pain. Neck supple. No mass and no thyromegaly present.  Cardiovascular: Normal rate, regular rhythm, S1 normal, S2 normal and normal heart sounds.  PMI is not displaced.   Pulmonary/Chest: Effort normal and breath sounds normal. No respiratory distress. She has no decreased breath sounds. She has no wheezes. She has no rhonchi.  Musculoskeletal: Normal range of motion. She exhibits no edema.  Lymphadenopathy:       Head (right side): No submental, no submandibular, no tonsillar, no posterior auricular and no occipital adenopathy present.       Head (left side): No submental, no submandibular, no tonsillar, no posterior auricular and no occipital adenopathy present.    She has no cervical adenopathy.       Right: No supraclavicular adenopathy present.  Left: No supraclavicular adenopathy present.  Neurological: She is alert and oriented to person, place, and time. No cranial nerve deficit. Coordination normal.  Skin: Skin is warm and dry. No rash noted.  She is not diaphoretic. No erythema. No pallor.  Psychiatric: She has a normal mood and affect. Her behavior is normal. Judgment normal.       Assessment & Plan:  Acute bronchitis- RX: Cefuroxime 250 mg 1 tab bid x 7 days.  Benign essential HTN - Stable on current medications. Continue same.  Plan: Comprehensive metabolic panel  Other and unspecified hyperlipidemia - Plan: Lipid panel, Thyroid Panel With TSH  Meds ordered this encounter  Medications  . cefUROXime (CEFTIN) 250 MG tablet    Sig: Take 1 tablet (250 mg total) by mouth 2 (two) times daily with a meal.    Dispense:  14 tablet    Refill:  0  . gemfibrozil (LOPID) 600 MG tablet    Sig: Take 1 tablet (600 mg total) by mouth 2 (two) times daily.    Dispense:  60 tablet    Refill:  3  . losartan (COZAAR) 50 MG tablet    Sig: Take 1 tablet (50 mg total) by mouth daily.    Dispense:  30 tablet    Refill:  5  . metFORMIN (GLUCOPHAGE) 1000 MG tablet    Sig: Take 1 tablet (1,000 mg total) by mouth 2 (two) times daily.    Dispense:  60 tablet    Refill:  5

## 2014-01-10 ENCOUNTER — Other Ambulatory Visit: Payer: BC Managed Care – PPO

## 2014-01-18 ENCOUNTER — Other Ambulatory Visit: Payer: Self-pay | Admitting: Family Medicine

## 2014-01-18 DIAGNOSIS — IMO0001 Reserved for inherently not codable concepts without codable children: Secondary | ICD-10-CM

## 2014-01-18 DIAGNOSIS — E1165 Type 2 diabetes mellitus with hyperglycemia: Principal | ICD-10-CM

## 2014-02-07 ENCOUNTER — Telehealth: Payer: Self-pay | Admitting: Internal Medicine

## 2014-02-07 ENCOUNTER — Other Ambulatory Visit: Payer: Self-pay | Admitting: *Deleted

## 2014-02-07 MED ORDER — INSULIN GLARGINE 100 UNIT/ML SOLOSTAR PEN: 35.0000 [IU] | PEN_INJECTOR | Freq: Every day | SUBCUTANEOUS | Status: DC

## 2014-02-07 MED ORDER — INSULIN ASPART 100 UNIT/ML FLEXPEN: PEN_INJECTOR | SUBCUTANEOUS | Status: DC

## 2014-02-07 NOTE — Telephone Encounter (Signed)
Pt taking Novolog Flex pen - 13 u in AM and 16 at lunch 16 in PM--please call in with new dosage to the pharmacy  Pt calling regarding novolog flex pen rx has 30 u daily to be given but this is 15 short for her daily usage  I think the pt has an old rx i do see this is correct in our system but the pt is not able to get the rxs at her pharmacy they are telling her the refill is too early

## 2014-02-07 NOTE — Telephone Encounter (Signed)
Contacted pharmacy. New rx for dosage changes had not been sent in. Done.

## 2014-03-20 ENCOUNTER — Telehealth: Payer: Self-pay | Admitting: *Deleted

## 2014-03-20 NOTE — Telephone Encounter (Signed)
Spoke to patient about follow up on diabetes with Dr Audria Nine, she has an appointment in October 2015. Per patient she sees Dr Elvera Lennox to help monitor her insulin dosage.

## 2014-04-02 ENCOUNTER — Ambulatory Visit: Payer: BC Managed Care – PPO | Admitting: Internal Medicine

## 2014-04-03 LAB — HM DIABETES EYE EXAM

## 2014-04-06 IMAGING — CR DG FOOT COMPLETE 3+V*R*
1 series · 1 of 1 positions shown · non-contrast
Comparison: None.

CLINICAL DATA: Foot pain and swelling

RIGHT FOOT COMPLETE - 3+ VIEW

[AP]
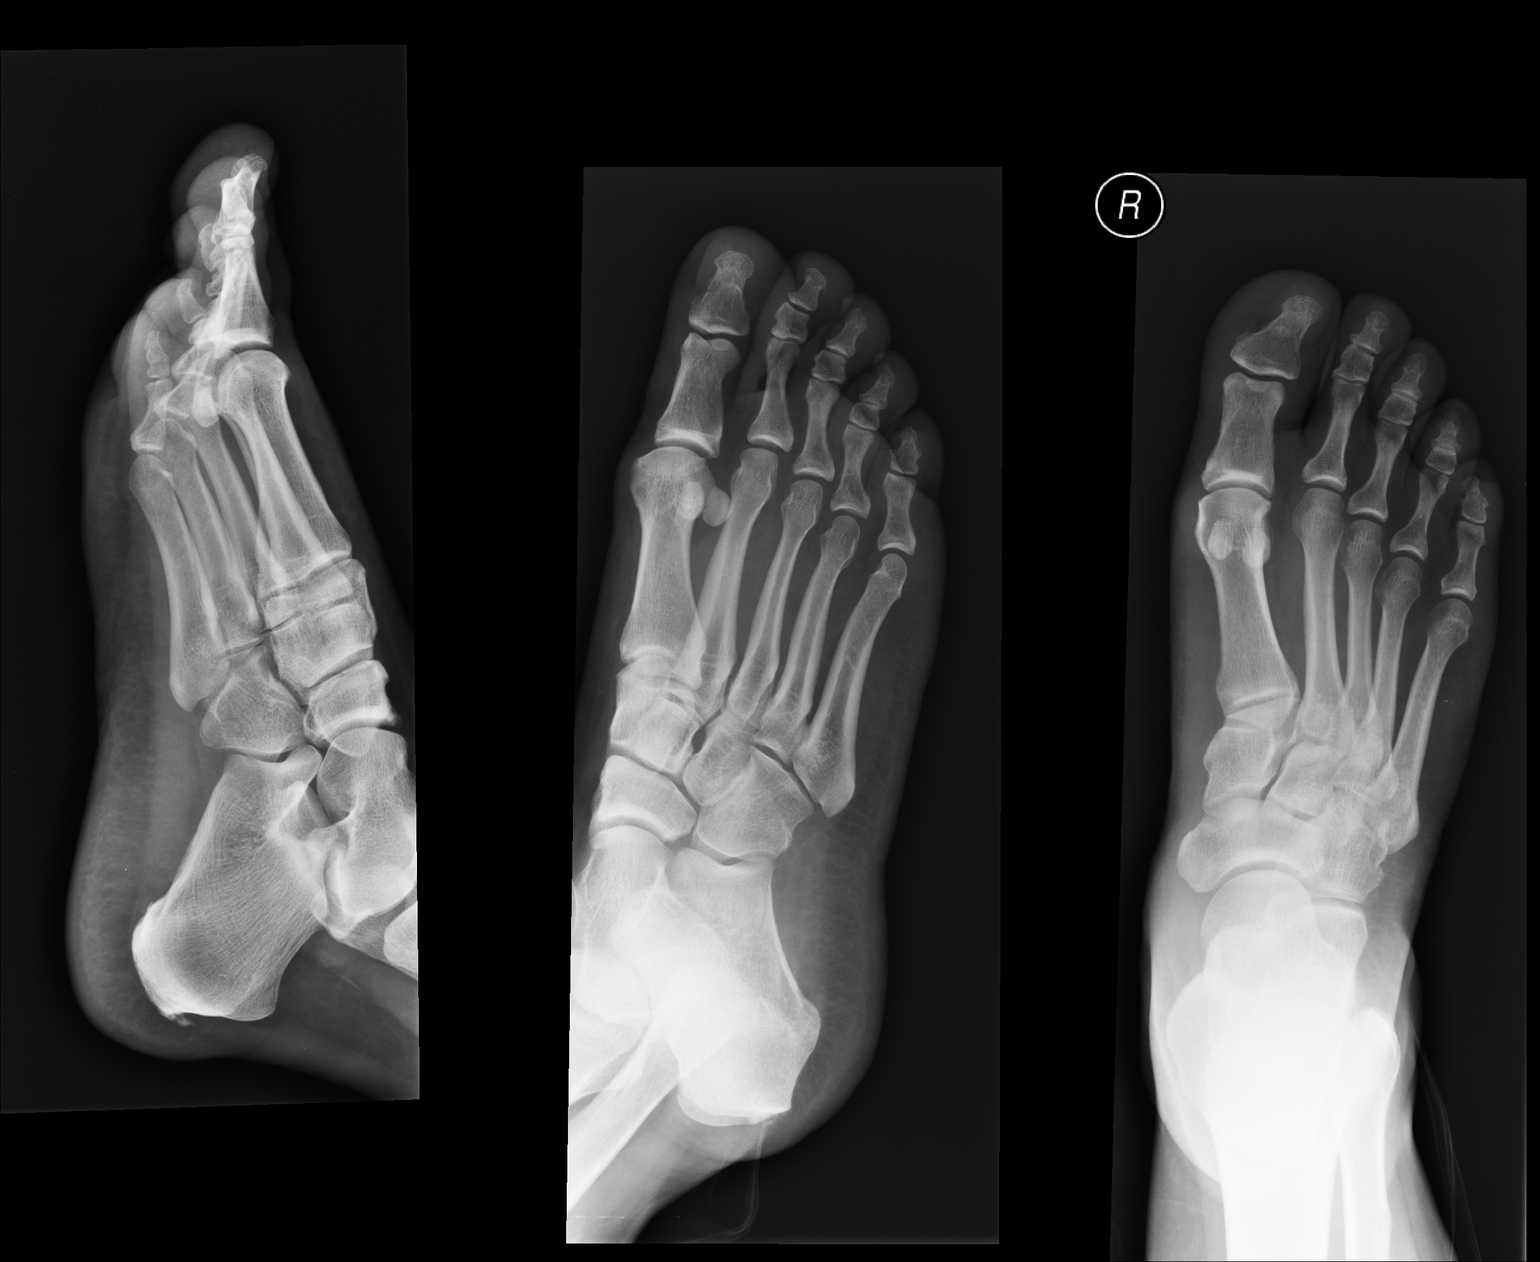

[1 of 1 positions shown; findings below may reference images not displayed]

FINDINGS: Three views of the right foot submitted.  No acute
fracture or subluxation.  Tiny posterior spur of the calcaneus.
IMPRESSION: No acute fracture or subluxation.  Tiny posterior spur of the
calcaneus.

## 2014-04-06 IMAGING — CR DG TOE GREAT 2+V*R*
1 series · 1 of 1 positions shown · non-contrast
Comparison: None.

CLINICAL DATA: Foot pain

RIGHT GREAT TOE

[AP]
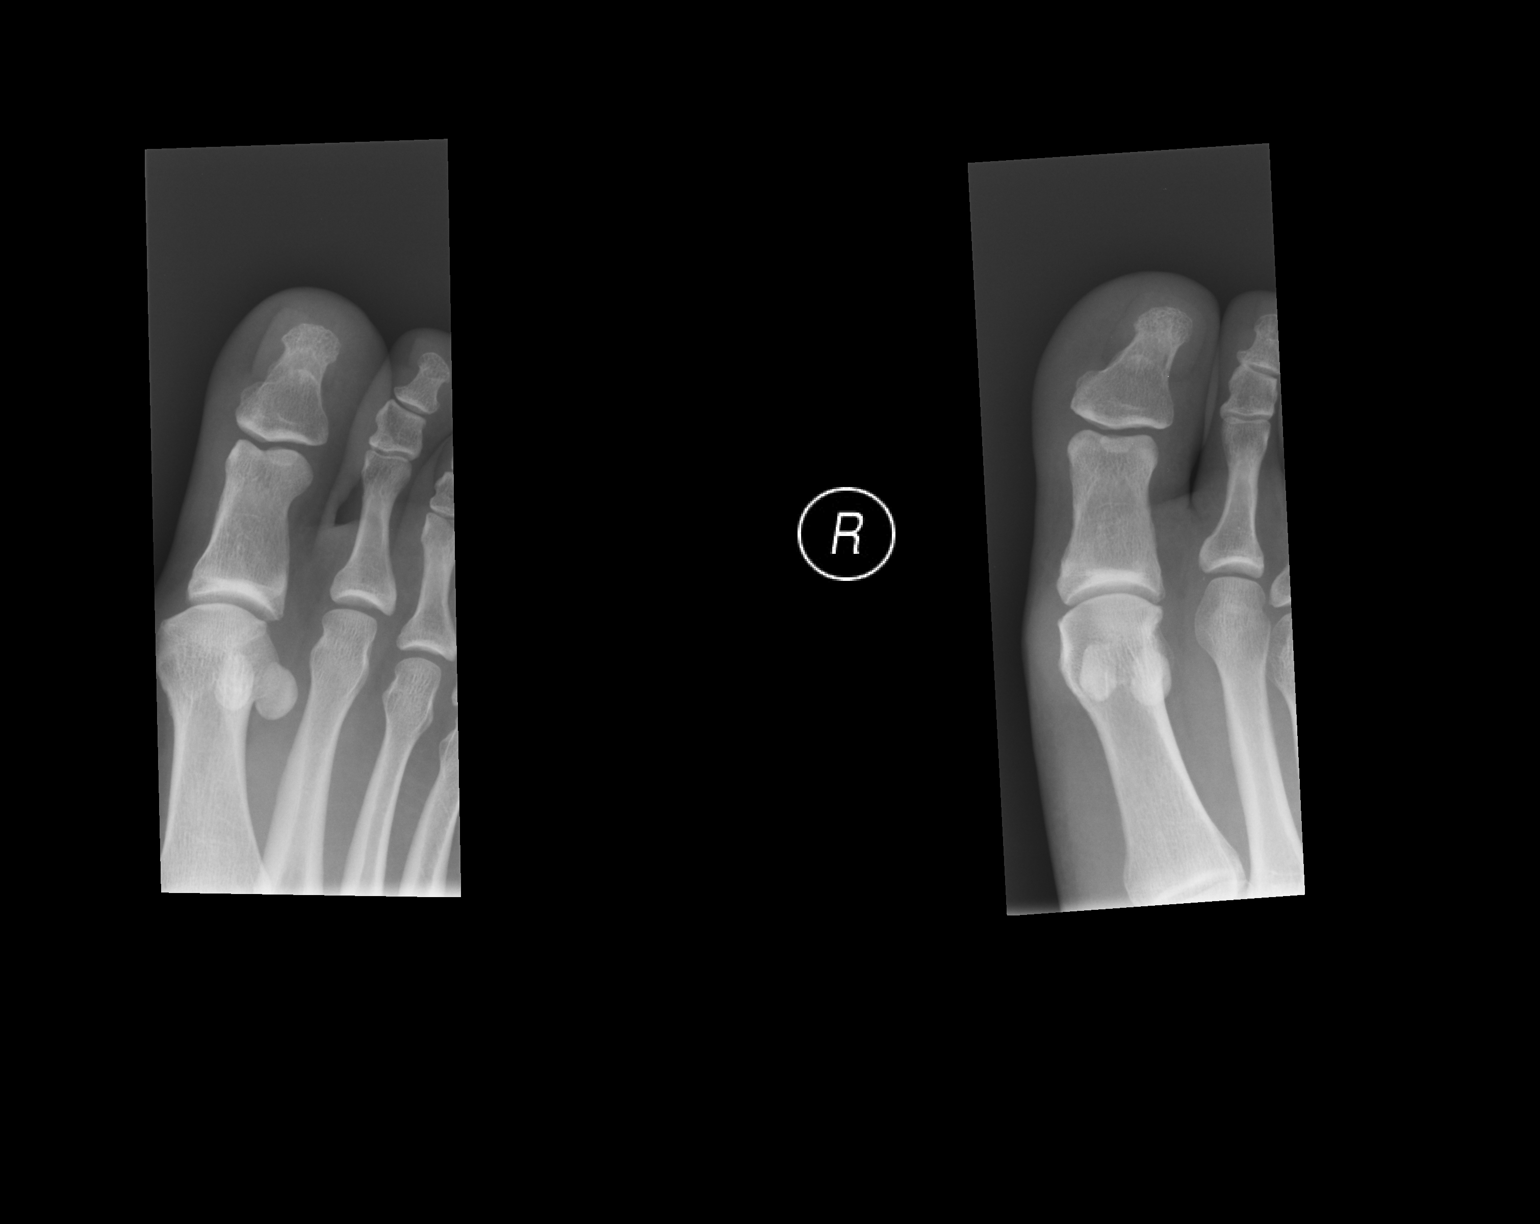

[1 of 1 positions shown; findings below may reference images not displayed]

FINDINGS: Two views of the right great toe submitted.  No acute
fracture or subluxation.
IMPRESSION: No acute fracture or subluxation.

## 2014-04-08 ENCOUNTER — Telehealth: Payer: Self-pay | Admitting: Internal Medicine

## 2014-04-08 NOTE — Telephone Encounter (Signed)
Rec'd from Davina Poke OD PA forward 3 pages to [employer]

## 2014-04-24 ENCOUNTER — Encounter: Payer: Self-pay | Admitting: Internal Medicine

## 2014-05-01 NOTE — Progress Notes (Signed)
[facility] Dr. Carlus Pavlovristina Gherghe for her Diabetic Care.

## 2014-05-02 ENCOUNTER — Encounter: Payer: BC Managed Care – PPO | Admitting: Family Medicine

## 2014-05-08 ENCOUNTER — Telehealth: Payer: Self-pay

## 2014-05-08 NOTE — Telephone Encounter (Signed)
[facility] [place] [facility] say [facility] this pt needs a RF of Lopid 600mg . Pharm phone #: [phone]

## 2014-05-09 MED ORDER — GEMFIBROZIL 600 MG PO TABS: 600.0000 mg | ORAL_TABLET | Freq: Two times a day (BID) | ORAL | Status: DC

## 2014-05-09 NOTE — Telephone Encounter (Signed)
Dr. Audria NineMcPherson,  For your approval for Lopid. No FLP in system for over one year. Please advise on refill.

## 2014-05-09 NOTE — Telephone Encounter (Signed)
I refilled Lopid (gemfibrozil) x 1 month; pt needs to sch OV.

## 2014-05-09 NOTE — Telephone Encounter (Signed)
LMOM of this info 

## 2014-05-16 NOTE — Telephone Encounter (Signed)
error 

## 2014-05-17 ENCOUNTER — Telehealth: Payer: Self-pay | Admitting: Internal Medicine

## 2014-05-17 NOTE — Telephone Encounter (Signed)
Pt called back. Pt asked if she could lower her insulin for a couple of days? Dr Elvera LennoxGherghe said yes, to 30 units. Pt said ok. Pt said she would call if she had any further problems. Be advised.

## 2014-05-17 NOTE — Telephone Encounter (Signed)
It would not be advised to go w/o Lantus. Let's try to call it in in [place] if she can give us a pharm #. She needs to keep the receit as she needs to be reimbursed by the airline.

## 2014-05-17 NOTE — Telephone Encounter (Signed)
Called pt and lvm advising her per Dr Charlean SanfilippoGherghe's note. Advised pt to return call.

## 2014-05-17 NOTE — Telephone Encounter (Signed)
Question about insulin the airline lost her bag with her insulin in it Lantus she had just filled last week Insurance will only cover if she has used 75%, she wanted to know if she could get a pin in [place] or can she skip a day or 2 until they locate her bag with her insulin in, if so what will happen if she don't take her insulin, Carollee HerterShannon said to forward this to you Dr. Elvera Lennoxgherghe

## 2014-06-03 ENCOUNTER — Ambulatory Visit (INDEPENDENT_AMBULATORY_CARE_PROVIDER_SITE_OTHER): Payer: BC Managed Care – PPO | Admitting: Internal Medicine

## 2014-06-03 ENCOUNTER — Encounter: Payer: Self-pay | Admitting: Internal Medicine

## 2014-06-03 VITALS — BP 122/70 | HR 86 | Temp 98.0°F | Resp 12 | Wt 214.8 lb

## 2014-06-03 DIAGNOSIS — E781 Pure hyperglyceridemia: Secondary | ICD-10-CM

## 2014-06-03 DIAGNOSIS — IMO0002 Reserved for concepts with insufficient information to code with codable children: Secondary | ICD-10-CM

## 2014-06-03 DIAGNOSIS — E1165 Type 2 diabetes mellitus with hyperglycemia: Secondary | ICD-10-CM

## 2014-06-03 LAB — HEMOGLOBIN A1C: HEMOGLOBIN A1C: 6.2 % (ref 4.6–6.5)

## 2014-06-03 NOTE — Progress Notes (Signed)
Patient ID: Carolyn Butler, female   DOB: 03-22-1971, 43 y.o.   MRN: [id number]  HPI: Carolyn Butler is a 43 y.o.-year-old female, returning for f/u for DM2, dx 2005, insulin-dependent since 01/2013, uncontrolled, without complications. Last visit was 5 mo ago.   Last hemoglobin A1c was: Lab Results  Component Value Date   HGBA1C 9.3* 09/21/2013  Previous A1c was 8.4% in 06/2012.  She has a h/o med noncompliance per PCPs notes.    Pt is on a regimen of: - Metformin 1000 mg po bid - Lantus 18 >> 25 >> 30 >> 35 units  - NovoLog:  - 10 >> 13 units for a small meal (sometimes 10 units) - 13 >> 16 units for a larger meal  Pt ws on Amaryl in the past and gained 15 lbs >> stopped it and lost the weight We stopped Januvia 100 mg in 01/2013 b/c increased TG and high risk of Pancreatitis.   Pt checks her sugars 3x a day and they are improved: - am: 275-330 >> 264-301 >> 130-170 (187) >> 139-170 >> 123-155 - 2h after breakfast: n/c >> 206, 240 >> n/c - before lunch: 250s >> n/c >> 145-190 >> 148-185 >> 77 x1 - 2h after lunch: 282 >> 260, 154, 294 >> 147-199 >> 120-136 - before dinner: not checking  >> one check 321, 388 (in 01/2014) >> n/c >> 135-206 >> 114-164 - bedtime: 198 >> 158-259 >> 200 x1 No lows. Lowest sugar was 135 >> 77 (late lunch); she has hypoglycemia awareness at 70. Highest sugar was 240 >> 200x1  Meals:  - b'fast: 1 egg + toast - lunch: salad + meat or fish - dinner: spaghetti, adds veggies or salads but not always Smaller portions.   She works out every day around 3-4 pm.  Pt does not have chronic kidney disease, last BUN/creatinine was:  Lab Results  Component Value Date   BUN 9 05/09/2013   CREATININE 0.44* 05/09/2013  On Losartan.  Last set of lipids - nonfasting: Lab Results  Component Value Date   CHOL 170 05/09/2013   HDL 28* 05/09/2013   LDLCALC Comment:   Not calculated due to Triglyceride >400.  05/09/2013   LDLDIRECT 86.8 02/16/2013   TRIG 790*  05/09/2013   CHOLHDL 6.1 05/09/2013  On omega 3 FA - 1 g 3 /day - she is taking it now. She is also on 600 mg bid >> TG decreased from 4500 to 790.  Pt's last eye exam was in 03/2014. No DR.  Denies numbness and tingling in her legs.   I reviewed her chart and she also has a history of HTN, HL, obesity class 2.   ROS: Constitutional: no weight gain, increased appetite, no fatigue, no subjective hyperthermia Eyes: no blurry vision, no xerophthalmia ENT: no sore throat, no nodules palpated in throat, no dysphagia/no odynophagia, no hoarseness Cardiovascular: no CP/SOB/palpitations/leg swelling Respiratory: no cough/SOB Gastrointestinal: no N/V/D/C/GERD Musculoskeletal: no muscle/joint aches Skin: no rashes Neurological: no tremors/numbness/tingling/dizziness  I reviewed pt's medications, allergies, PMH, social hx, family hx and no changes required, except as mentioned above.  PE: BP 122/70 mmHg  Pulse 86  Temp(Src) 98 F (36.7 C) (Oral)  Resp 12  Wt 214 lb 12.8 oz (97.433 kg)  SpO2 96% Wt Readings from Last 3 Encounters:  06/03/14 214 lb 12.8 oz (97.433 kg)  01/01/14 210 lb (95.255 kg)  12/28/13 207 lb 12.8 oz (94.257 kg)   Constitutional: overweight, in NAD Eyes: PERRLA, EOMI,  no exophthalmos ENT: moist mucous membranes, no thyromegaly, no cervical lymphadenopathy Cardiovascular: RRR, No MRG Respiratory: CTA B Gastrointestinal: abdomen soft, NT, ND, BS+ Musculoskeletal: no deformities, strength intact in all 4 Skin: moist, warm, no rashes. No tendon xanthomas. Neurological: no tremor with outstretched hands, DTR normal in all 4  ASSESSMENT: 1. DM2, insulin-dependent, uncontrolled, without complications  2. HTG  PLAN:  1.  Patient with uncontrolled DM 2, with improvement after adding mealtime insulin and especially after increasing insulin doses at last visit - I advised her to continue insulin as follows:  Patient Instructions  Please continue: - Metformin 1000  mg 2x a day - Lantus 35 units at bedtime - Continue NovoLog:  - 10-13 units for a smaller meal  - 13-16 units for a larger meal  Please return in 3 months with your sugar log.   Please stop at the lab.   - up to date with eye exams - check HbA1c today - will get flu vaccine soon  2. Hypertriglyceridemia - on Lopid 600 mg twice a day, and  omega-3 fatty acids 3 g a day. - TG improved per last check, but needs a new level - will get it AT pcpS OFFICE tomorrow  Office Visit on 06/03/2014  Component Date Value Ref Range Status  . Hgb A1c MFr Bld 06/03/2014 6.2  4.6 - 6.5 % Final   Glycemic Control Guidelines for People with Diabetes:Non Diabetic:  <6%Goal of Therapy: <7%Additional Action Suggested:  >8%    Excellent improvement in HbA1c!

## 2014-06-03 NOTE — Patient Instructions (Signed)
Please continue: - Metformin 1000 mg 2x a day - Lantus 35 units at bedtime - Continue NovoLog:  - 10-13 units for a smaller meal  - 13-16 units for a larger meal  Please return in 3 months with your sugar log.   Please stop at the lab.

## 2014-06-06 ENCOUNTER — Other Ambulatory Visit: Payer: Self-pay | Admitting: Family Medicine

## 2014-06-07 NOTE — Telephone Encounter (Signed)
Spoke with patient calling in 15 days on lopid.  States going to schedule appointment.

## 2014-06-11 ENCOUNTER — Other Ambulatory Visit (INDEPENDENT_AMBULATORY_CARE_PROVIDER_SITE_OTHER): Payer: BC Managed Care – PPO | Admitting: Family Medicine

## 2014-06-11 DIAGNOSIS — R748 Abnormal levels of other serum enzymes: Secondary | ICD-10-CM

## 2014-06-11 DIAGNOSIS — Z1329 Encounter for screening for other suspected endocrine disorder: Secondary | ICD-10-CM

## 2014-06-11 DIAGNOSIS — I10 Essential (primary) hypertension: Secondary | ICD-10-CM

## 2014-06-11 DIAGNOSIS — R635 Abnormal weight gain: Secondary | ICD-10-CM

## 2014-06-11 LAB — COMPLETE METABOLIC PANEL WITH GFR
ALT: 21 U/L (ref 0–35)
AST: 17 U/L (ref 0–37)
Albumin: 4.3 g/dL (ref 3.5–5.2)
Alkaline Phosphatase: 23 U/L — ABNORMAL LOW (ref 39–117)
BILIRUBIN TOTAL: 0.5 mg/dL (ref 0.2–1.2)
BUN: 8 mg/dL (ref 6–23)
CO2: 24 meq/L (ref 19–32)
CREATININE: 0.48 mg/dL — AB (ref 0.50–1.10)
Calcium: 9.3 mg/dL (ref 8.4–10.5)
Chloride: 100 mEq/L (ref 96–112)
GFR, Est Non African American: >89 mL/min
Glucose, Bld: 121 mg/dL — ABNORMAL HIGH (ref 70–99)
Potassium: 4 mEq/L (ref 3.5–5.3)
SODIUM: 134 meq/L — AB (ref 135–145)
TOTAL PROTEIN: 7.4 g/dL (ref 6.0–8.3)

## 2014-06-11 LAB — LIPID PANEL
Cholesterol: 166 mg/dL (ref 0–200)
HDL: 29 mg/dL — ABNORMAL LOW (ref >39)
LDL CALC: 103 mg/dL — AB (ref 0–99)
Total CHOL/HDL Ratio: 5.7 Ratio
Triglycerides: 170 mg/dL — ABNORMAL HIGH (ref <150)
VLDL: 34 mg/dL (ref 0–40)

## 2014-06-12 LAB — THYROID PANEL WITH TSH
FREE THYROXINE INDEX: 2.1 (ref 1.4–3.8)
T3 UPTAKE: 24 % (ref 22–35)
T4, Total: 8.7 ug/dL (ref 4.5–12.0)
TSH: 1.118 u[IU]/mL (ref 0.350–4.500)

## 2014-06-12 NOTE — Progress Notes (Signed)
Quick Note:  Please advise pt regarding following labs...  Thyroid function results are normal. Lipid profile results are nearly normal; triglycerides came down from 790 to 170! HDL ("good") cholesterol is still much too low. Metabolic panel results are good. Continue all current medications. Schedule follow-up visit for January 2016.  Coy to pt. ______

## 2014-07-04 ENCOUNTER — Other Ambulatory Visit: Payer: Self-pay | Admitting: Family Medicine

## 2014-07-04 ENCOUNTER — Telehealth: Payer: Self-pay | Admitting: Internal Medicine

## 2014-07-04 ENCOUNTER — Other Ambulatory Visit: Payer: Self-pay | Admitting: Physician Assistant

## 2014-07-04 NOTE — Telephone Encounter (Signed)
Please read note below and advise.  

## 2014-07-04 NOTE — Telephone Encounter (Signed)
FYI Pt had blood work done at PCP-Dr. Audria NineMcPherson. She wanted to let you know so you can look it up

## 2014-07-04 NOTE — Telephone Encounter (Signed)
Yes! Excellent TFTs and Lipids!!!

## 2014-07-04 NOTE — Telephone Encounter (Signed)
Pt called about labs. Let her know what Dr. Delma OfficerMcP said and that she needed to f/u in Jan. Transferred pt to billing to make an appt and refilled her meds

## 2014-07-05 NOTE — Telephone Encounter (Signed)
Called pt and lvm advising her per Dr Gherghe's note.  

## 2014-07-07 ENCOUNTER — Telehealth: Payer: Self-pay

## 2014-07-07 NOTE — Telephone Encounter (Signed)
Pt LM on lab VM.  Thyroid function results are normal. Lipid profile results are nearly normal; triglycerides came down from 790 to 170!  HDL ("good") cholesterol is still much too low. Metabolic panel results are good. Continue all current medications. Schedule follow-up visit for January 2016.  Pt notified.

## 2014-08-01 ENCOUNTER — Encounter: Payer: Self-pay | Admitting: Family Medicine

## 2014-08-01 ENCOUNTER — Ambulatory Visit (INDEPENDENT_AMBULATORY_CARE_PROVIDER_SITE_OTHER): Payer: BLUE CROSS/BLUE SHIELD | Admitting: Family Medicine

## 2014-08-01 VITALS — BP 132/87 | HR 88 | Temp 98.2°F | Resp 16 | Ht 65.5 in | Wt 213.0 lb

## 2014-08-01 DIAGNOSIS — Z1239 Encounter for other screening for malignant neoplasm of breast: Secondary | ICD-10-CM

## 2014-08-01 DIAGNOSIS — I10 Essential (primary) hypertension: Secondary | ICD-10-CM

## 2014-08-01 DIAGNOSIS — N644 Mastodynia: Secondary | ICD-10-CM

## 2014-08-01 DIAGNOSIS — E781 Pure hyperglyceridemia: Secondary | ICD-10-CM

## 2014-08-01 MED ORDER — LOSARTAN POTASSIUM 50 MG PO TABS
50.0000 mg | ORAL_TABLET | Freq: Every day | ORAL | Status: DC
Start: 2014-08-01 — End: 2014-11-14

## 2014-08-01 MED ORDER — GEMFIBROZIL 600 MG PO TABS: 600.0000 mg | ORAL_TABLET | Freq: Two times a day (BID) | ORAL | Status: DC

## 2014-08-01 MED ORDER — CYCLOBENZAPRINE HCL 10 MG PO TABS: ORAL_TABLET | ORAL | Status: DC

## 2014-08-01 MED ORDER — GEMFIBROZIL 600 MG PO TABS
600.0000 mg | ORAL_TABLET | Freq: Two times a day (BID) | ORAL | Status: DC
Start: 2014-08-01 — End: 2014-11-14

## 2014-08-01 NOTE — Patient Instructions (Addendum)
DASH Eating Plan DASH stands for "Dietary Approaches to Stop Hypertension." The DASH eating plan is a healthy eating plan that has been shown to reduce high blood pressure (hypertension). Additional health benefits may include reducing the risk of type 2 diabetes mellitus, heart disease, and stroke. The DASH eating plan may also help with weight loss. WHAT DO I NEED TO KNOW ABOUT THE DASH EATING PLAN? For the DASH eating plan, you will follow these general guidelines:  Choose foods with a percent daily value for sodium of less than 5% (as listed on the food label).  Use salt-free seasonings or herbs instead of table salt or sea salt.  Check with your health care provider or pharmacist before using salt substitutes.  Eat lower-sodium products, often labeled as "lower sodium" or "no salt added."  Eat fresh foods.  Eat more vegetables, fruits, and low-fat dairy products.  Choose whole grains. Look for the word "whole" as the first word in the ingredient list.  Choose fish and skinless chicken or turkey more often than red meat. Limit fish, poultry, and meat to 6 oz (170 g) each day.  Limit sweets, desserts, sugars, and sugary drinks.  Choose heart-healthy fats.  Limit cheese to 1 oz (28 g) per day.  Eat more home-cooked food and less restaurant, buffet, and fast food.  Limit fried foods.  Cook foods using methods other than frying.  Limit canned vegetables. If you do use them, rinse them well to decrease the sodium.  When eating at a restaurant, ask that your food be prepared with less salt, or no salt if possible. WHAT FOODS CAN I EAT? Seek help from a dietitian for individual calorie needs. Grains Whole grain or whole wheat bread. Brown rice. Whole grain or whole wheat pasta. Quinoa, bulgur, and whole grain cereals. Low-sodium cereals. Corn or whole wheat flour tortillas. Whole grain cornbread. Whole grain crackers. Low-sodium crackers. Vegetables Fresh or frozen vegetables  (raw, steamed, roasted, or grilled). Low-sodium or reduced-sodium tomato and vegetable juices. Low-sodium or reduced-sodium tomato sauce and paste. Low-sodium or reduced-sodium canned vegetables.  Fruits All fresh, canned (in natural juice), or frozen fruits. Meat and Other Protein Products Ground beef (85% or leaner), grass-fed beef, or beef trimmed of fat. Skinless chicken or turkey. Ground chicken or turkey. Pork trimmed of fat. All fish and seafood. Eggs. Dried beans, peas, or lentils. Unsalted nuts and seeds. Unsalted canned beans. Dairy Low-fat dairy products, such as skim or 1% milk, 2% or reduced-fat cheeses, low-fat ricotta or cottage cheese, or plain low-fat yogurt. Low-sodium or reduced-sodium cheeses. Fats and Oils Tub margarines without trans fats. Light or reduced-fat mayonnaise and salad dressings (reduced sodium). Avocado. Safflower, olive, or canola oils. Natural peanut or almond butter. Other Unsalted popcorn and pretzels. The items listed above may not be a complete list of recommended foods or beverages. Contact your dietitian for more options. WHAT FOODS ARE NOT RECOMMENDED? Grains White bread. White pasta. White rice. Refined cornbread. Bagels and croissants. Crackers that contain trans fat. Vegetables Creamed or fried vegetables. Vegetables in a cheese sauce. Regular canned vegetables. Regular canned tomato sauce and paste. Regular tomato and vegetable juices. Fruits Dried fruits. Canned fruit in light or heavy syrup. Fruit juice. Meat and Other Protein Products Fatty cuts of meat. Ribs, chicken wings, bacon, sausage, bologna, salami, chitterlings, fatback, hot dogs, bratwurst, and packaged luncheon meats. Salted nuts and seeds. Canned beans with salt. Dairy Whole or 2% milk, cream, half-and-half, and cream cheese. Whole-fat or sweetened yogurt. Full-fat   cheeses or blue cheese. Nondairy creamers and whipped toppings. Processed cheese, cheese spreads, or cheese  curds. Condiments Onion and garlic salt, seasoned salt, table salt, and sea salt. Canned and packaged gravies. Worcestershire sauce. Tartar sauce. Barbecue sauce. Teriyaki sauce. Soy sauce, including reduced sodium. Steak sauce. Fish sauce. Oyster sauce. Cocktail sauce. Horseradish. Ketchup and mustard. Meat flavorings and tenderizers. Bouillon cubes. Hot sauce. Tabasco sauce. Marinades. Taco seasonings. Relishes. Fats and Oils Butter, stick margarine, lard, shortening, ghee, and bacon fat. Coconut, palm kernel, or palm oils. Regular salad dressings. Other Pickles and olives. Salted popcorn and pretzels. The items listed above may not be a complete list of foods and beverages to avoid. Contact your dietitian for more information. WHERE CAN I FIND MORE INFORMATION? National Heart, Lung, and Blood Institute: CablePromo.itwww.nhlbi.nih.gov/health/health-topics/topics/dash/ Document Released: 07/01/2011 Document Revised: 11/26/2013 Document Reviewed: 05/16/2013 [facility] Patient Information 2015 [place], [place]. This information is not intended to replace advice given to you by your health care provider. Make sure you discuss any questions you have with your health care provider. Pneumococcal Conjugate Vaccine: What You Need to Know Your doctor recommends that you, or your child, get a dose of PCV13 today. 1. Why get vaccinated? Pneumococcal conjugate vaccine (called PCV13 or Prevnar 13) is recommended to protect infants and toddlers, and some older children and adults with certain health conditions, from pneumococcal disease. Pneumococcal disease is caused by infection with Streptococcus pneumoniae bacteria. These bacteria can spread from person to person through close contact. Pneumococcal disease can lead to severe health problems, including pneumonia, blood infections, and meningitis. Meningitis is an infection of the covering of the brain. Pneumococcal meningitis is fairly rare (less than 1 case per 100,000  people each year), but it leads to other health problems, including deafness and brain damage. In children, it is fatal in about 1 case out of 10. Children younger than two are at higher risk for serious disease than older children. People with certain medical conditions, people over age 45, and cigarette smokers are also at higher risk. Before vaccine, pneumococcal infections caused many problems each year in the [place] in children younger than 5, including:  more than 700 cases of meningitis,  13,000 blood infections,  about 5 million ear infections, and  about 200 deaths. About 4,000 adults still die each year because of pneumococcal infections. Pneumococcal infections can be hard to treat because some strains are resistant to antibiotics. This makes prevention through vaccination even more important. 2. PCV13 vaccine There are more than 90 types of pneumococcal bacteria. PCV13 protects against 13 of them. These 13 strains cause most severe infections in children and about half of infections in adults.  PCV13 is routinely given to children at 2, 4, 6, and 5912-8115 months of age. Children in this age range are at greatest risk for serious diseases caused by pneumococcal infection. PCV13 vaccine may also be recommended for some older children or adults. Your doctor can give you details. A second type of pneumococcal vaccine, called PPSV23, may also be given to some children and adults, including anyone over age 45. There is a separate Vaccine Information Statement for this vaccine. 3. Precautions  Anyone who has ever had a life-threatening allergic reaction to a dose of this vaccine, to an earlier pneumococcal vaccine called PCV7 (or Prevnar), or to any vaccine containing diphtheria toxoid (for example, DTaP), should not get PCV13. Anyone with a severe allergy to any component of PCV13 should not get the vaccine. Tell your doctor if the  person being vaccinated has any severe allergies. If  the person scheduled for vaccination is sick, your doctor might decide to reschedule the shot on another day. Your doctor can give you more information about any of these precautions. 4. What are the risks of PCV13 vaccine?  With any medicine, including vaccines, there is a chance of side effects. These are usually mild and go away on their own, but serious reactions are also possible. Reported problems associated with PCV13 vary by dose and age, but generally:  About half of children became drowsy after the shot, had a temporary loss of appetite, or had redness or tenderness where the shot was given.  About 1 out of 3 had swelling where the shot was given.  About 1 out of 3 had a mild fever, and about 1 in 20 had a higher fever (over 102.52F).  Up to about 8 out of 10 became fussy or irritable. Adults receiving the vaccine have reported redness, pain, and swelling where the shot was given. Mild fever, fatigue, headache, chills, or muscle pain have also been reported. Life-threatening allergic reactions from any vaccine are very rare. 5. What if there is a serious reaction? What should I look for?  Look for anything that concerns you, such as signs of a severe allergic reaction, very high fever, or behavior changes. Signs of a severe allergic reaction can include hives, swelling of the face and throat, difficulty breathing, a fast heartbeat, dizziness, and weakness. These would start a few minutes to a few hours after the vaccination. What should I do?  If you think it is a severe allergic reaction or other emergency that can't wait, call 9-1-1 or get the person to the nearest hospital. Otherwise, call your doctor.  Afterward, the reaction should be reported to the Vaccine Adverse Event Reporting System (VAERS). Your doctor might file this report, or you can do it yourself through the VAERS web site at www.vaers.LAgents.no, or by calling 1-330-826-9976. VAERS is only for reporting reactions.  They do not give medical advice. 6. The National Vaccine Injury Compensation Program The [employer] Vaccine Injury Compensation Program (VICP) is a federal program that was created to compensate people who may have been injured by certain vaccines. Persons who believe they may have been injured by a vaccine can learn about the program and about filing a claim by calling 1-229-150-7090 or visiting the VICP website at SpiritualWord.at. 7. How can I learn more?  Ask your doctor.  Call your local or state health department.  Contact the Centers for Disease Control and Prevention (CDC):  Call 240 710 8211 (1-800-CDC-INFO) or  Visit CDC's website at PicCapture.uy CDC PCV13 Vaccine VIS (Interim) (09/22/11) Document Released: 05/09/2006 Document Revised: 11/26/2013 Document Reviewed: 08/31/2013 [facility] Patient Information 2015 [place], [place]. This information is not intended to replace advice given to you by your health care provider. Make sure you discuss any questions you have with your health care provider.

## 2014-08-04 ENCOUNTER — Encounter: Payer: Self-pay | Admitting: Family Medicine

## 2014-08-04 NOTE — Progress Notes (Signed)
S;  This 44 y.o. Female is here for HTN follow-up and medication refills. SHe is compliant w/ medications w/o adverse effects. She has Type II DM but has follow-up /A1c in March 2016. Pt denies diaphoresis, vision disturbances, CP or tightness, palpitations, SOB or DOE, edema, cough, AH, dizziness, numbness, weakness or syncope.   Pt c/o L breast discomfort x 2-3 days. Pt has reduced caffeine intake. She denies recent trauma, redness, itching, nipple discharge or skin changes. No recent MMG. Family hx negative for breast disease/cancer.  Primary triglyceride disorder- this is related, in part, to DM; pt is compliant w/ gemfibrozil but not restrictive enough w/ diet. She has gained 6 lbs in last 6 months. No GI complaints w/ medication.  Pt has chronic, intermittent muscle spasms in back, partially related to work responsibilities. Seems to be related to certain movements; some relief w/ OTC analgesics. Requests refill of muscle relaxant.   Patient Active Problem List   Diagnosis Date Noted  . Pseudohyponatremia 12/14/2012  . Hypertriglyceridemia 12/14/2012  . Type 2 diabetes mellitus, uncontrolled 11/20/2011  . Benign essential HTN 11/20/2011  . Obesity, Class II, BMI 35-39.9, with comorbidity 11/20/2011  . Sleep disturbance 11/20/2011    Prior to Admission medications   Medication Sig Start Date End Date Taking? Authorizing Provider  aspirin 81 MG tablet Take 81 mg by mouth daily.   Yes Historical Provider, MD  cyclobenzaprine (FLEXERIL) 10 MG tablet TAKE ONE TABLET BY MOUTH AT BEDTIME AS NEEDED FOR MUSCLE SPASM   Yes Maurice March, MD  gemfibrozil (LOPID) 600 MG tablet Take 1 tablet (600 mg total) by mouth 2 (two) times daily with a meal.   Yes Maurice March, MD  glucose blood (ONE TOUCH ULTRA TEST) test strip Use to test blood sugar 3 times daily as instructed. 11/26/13  Yes Carlus Pavlov, MD  insulin aspart (NOVOLOG FLEXPEN) 100 UNIT/ML FlexPen Inject under skin up to 50  units daily as advised. 02/07/14  Yes Carlus Pavlov, MD  Insulin Glargine (LANTUS SOLOSTAR) 100 UNIT/ML Solostar Pen Inject 35 Units into the skin at bedtime. 02/07/14  Yes Carlus Pavlov, MD  Insulin Pen Needle (PEN NEEDLES 3/16") 31G X 5 MM MISC 1 each by Does not apply route daily. 02/16/13  Yes Carlus Pavlov, MD  Insulin Pen Needle 32G X 4 MM MISC Use to inject insulin 4 times a day as instructed. 11/19/13  Yes Carlus Pavlov, MD  losartan (COZAAR) 50 MG tablet Take 1 tablet (50 mg total) by mouth daily.   Yes Maurice March, MD  metFORMIN (GLUCOPHAGE) 1000 MG tablet TAKE ONE TABLET BY MOUTH TWICE DAILY 07/04/14  Yes Maurice March, MD  Multiple Vitamin (MULTIVITAMIN) tablet Take 1 tablet by mouth daily.   Yes Historical Provider, MD  Omega-3 Fatty Acids (OMEGA 3 PO) Take by mouth 2 (two) times daily with a meal.   Yes Historical Provider, MD  VITAMIN E PO Take by mouth daily.   Yes Historical Provider, MD    History   Social History  . Marital Status: Married    Spouse Name: N/A    Number of Children: N/A  . Years of Education: N/A   Occupational History  . Not on file.   Social History Main Topics  . Smoking status: Never Smoker   . Smokeless tobacco: Never Used  . Alcohol Use: No     Comment: twice a year mixed drink or red wine  . Drug Use: No  . Sexual Activity: Yes  Other Topics Concern  . Not on file   Social History Narrative    ROS: As per HPI. Otherwise noncontributory.  O: Filed Vitals:   08/01/14 1127  BP: 132/87  Pulse: 88  Temp: 98.2 F (36.8 C)  Resp: 16    GEN: In NAD; WN,WD. HENT: [place]/AT; EOMI w/ clear conj/sclerae. Ext ears/nose/oroph unremarkable. NECK: Supple. No TMG. BREASTS: Normal appearance, symmetric w/o palpable mass in area indicated by pt. COR: RRR. LUNGS: Normal resp rate and effort. CTA in all lung fields. BACK: Spine straight; NT spine. Mild paravertebral muscle spasms in T-spine region. MS: MAEs; no deformities  or decreased ROM.  NEURO: A&O x 3; CNs intact. Normal motor function. Gait normal. Nonfocal.  A/P: Benign essential HTN- Stable and controlled on current medication-  Losartan 50 mg 1 tab daily.  Breast pain, left- Do not suspect serious etiology; suspect localized chest muscle spasm. Continue to restrict caffeine consumption.  Hypertriglyceridemia- Continue Gemfibrozil and encouraged DASH Diet, weight loss w/ regular exercise.  Screening for breast cancer - Plan: MM Digital Screening  Recommended Prevnar13 vaccine w/ pt; she was provided print literature about this recommendation and will discuss at next OV.  Meds ordered this encounter  Medications  . cyclobenzaprine (FLEXERIL) 10 MG tablet    Sig: TAKE ONE TABLET BY MOUTH AT BEDTIME AS NEEDED FOR MUSCLE SPASM    Dispense:  60 tablet    Refill:  1  . losartan (COZAAR) 50 MG tablet    Sig: Take 1 tablet (50 mg total) by mouth daily.    Dispense:  30 tablet    Refill:  5  . gemfibrozil (LOPID) 600 MG tablet    Sig: Take 1 tablet (600 mg total) by mouth 2 (two) times daily with a meal.    Dispense:  60 tablet    Refill:  5

## 2014-08-07 ENCOUNTER — Other Ambulatory Visit: Payer: Self-pay | Admitting: *Deleted

## 2014-08-07 ENCOUNTER — Telehealth: Payer: Self-pay | Admitting: Internal Medicine

## 2014-08-07 DIAGNOSIS — E1165 Type 2 diabetes mellitus with hyperglycemia: Secondary | ICD-10-CM

## 2014-08-07 DIAGNOSIS — IMO0002 Reserved for concepts with insufficient information to code with codable children: Secondary | ICD-10-CM

## 2014-08-07 MED ORDER — "PEN NEEDLES 3/16"" 31G X 5 MM MISC": Status: DC

## 2014-08-07 NOTE — Telephone Encounter (Signed)
Patient states her pharmacy needs the rx to say 8 times daily    Please advise    Thank you

## 2014-08-08 MED ORDER — "PEN NEEDLES 3/16"" 31G X 5 MM MISC": Status: DC

## 2014-09-05 ENCOUNTER — Other Ambulatory Visit: Payer: Self-pay | Admitting: Family Medicine

## 2014-09-17 ENCOUNTER — Other Ambulatory Visit: Payer: Self-pay | Admitting: *Deleted

## 2014-09-17 MED ORDER — INSULIN PEN NEEDLE 32G X 4 MM MISC: Status: DC

## 2014-09-18 ENCOUNTER — Ambulatory Visit: Payer: Self-pay | Admitting: Internal Medicine

## 2014-09-25 ENCOUNTER — Encounter: Payer: Self-pay | Admitting: Internal Medicine

## 2014-09-25 ENCOUNTER — Ambulatory Visit (INDEPENDENT_AMBULATORY_CARE_PROVIDER_SITE_OTHER): Payer: BLUE CROSS/BLUE SHIELD | Admitting: Internal Medicine

## 2014-09-25 VITALS — BP 124/76 | HR 97 | Temp 98.7°F | Resp 12 | Wt 210.0 lb

## 2014-09-25 DIAGNOSIS — IMO0002 Reserved for concepts with insufficient information to code with codable children: Secondary | ICD-10-CM

## 2014-09-25 DIAGNOSIS — E1165 Type 2 diabetes mellitus with hyperglycemia: Secondary | ICD-10-CM

## 2014-09-25 DIAGNOSIS — E781 Pure hyperglyceridemia: Secondary | ICD-10-CM

## 2014-09-25 NOTE — Patient Instructions (Signed)
Please stop at the lab.  Please continue: - Metformin 1000 mg 2x a day - Lantus 35 units at night - NovoLog:  - 10-13 units for a small meal  - 13-16 units for a larger meal   If you switch back to the Mediterranean diet, decrease the insulin as follows: - Metformin 1000 mg 2x a day - Lantus 30 units at night - NovoLog:  - 8-10 units for a small meal  - 10-12 units for a larger meal   Please come back for a follow-up appointment in 3 months

## 2014-09-25 NOTE — Progress Notes (Signed)
Patient ID: Carolyn Butler, female   DOB: May 05, 1971, 44 y.o.   MRN: [id number]  HPI: Carolyn Butler is a 44 y.o.-year-old female, returning for f/u for DM2, dx 2005, insulin-dependent since 01/2013, controlled, without complications. Last visit was 3 mo ago.   Last hemoglobin A1c was: Lab Results  Component Value Date   HGBA1C 6.2 06/03/2014   HGBA1C 9.3* 09/21/2013   HGBA1C 10.0 05/09/2013  Previous A1c was 8.4% in 06/2012.  She has a h/o med noncompliance per PCPs notes.    Pt is on a regimen of: - Metformin 1000 mg po bid - Lantus 35 units at night - NovoLog:  - 10-13 units for a small meal  - 13-16 units for a larger meal  Pt ws on Amaryl in the past and gained 15 lbs >> stopped it and lost the weight We stopped Januvia 100 mg in 01/2013 b/c increased TG and high risk of Pancreatitis.   Pt checks her sugars 3x a day and they are improved - ave for 1 mo: 124. - am: 275-330 >> 264-301 >> 130-170 (187) >> 139-170 >> 123-155 >> 104-120s - 2h after breakfast: n/c >> 206, 240 >> n/c - before lunch: 250s >> n/c >> 145-190 >> 148-185 >> 77 x1 >> 120s - 2h after lunch: 282 >> 260, 154, 294 >> 147-199 >> 120-136 >> 120-150, 163 - before dinner:321, 388 (in 01/2014) >> n/c >> 135-206 >> 114-164 >> n/c - bedtime: 198 >> 158-259 >> 200 x1 >> 160s, 179 No lows. Lowest sugar was 87; she has hypoglycemia awareness at 70. Highest sugar was 240 >> 200 >> 179.  Meals (will go back to the mediterranean diet):  - b'fast: 1 egg + toast - lunch: salad + meat or fish - dinner: spaghetti, adds veggies or salads but not always Smaller portions.   Pt does not have chronic kidney disease, last BUN/creatinine was:  Lab Results  Component Value Date   BUN 8 06/11/2014   CREATININE 0.48* 06/11/2014  On Losartan.  Last set of lipids - nonfasting: Lab Results  Component Value Date   CHOL 166 06/11/2014   HDL 29* 06/11/2014   LDLCALC 103* 06/11/2014   LDLDIRECT 86.8 02/16/2013   TRIG 170*  06/11/2014   CHOLHDL 5.7 06/11/2014   Previously: Lab Results  Component Value Date   CHOL 170 05/09/2013   HDL 28* 05/09/2013   LDLCALC Comment:   Not calculated due to Triglyceride >400.  05/09/2013   LDLDIRECT 86.8 02/16/2013   TRIG 790* 05/09/2013   CHOLHDL 6.1 05/09/2013  On omega 3 FA - 1 g 2x /day - she is taking it now. She is also on 600 mg bid >> TG decreased from 4500 to 790 to 170!  Pt's last eye exam was in 03/2014. No DR.  Denies numbness and tingling in her legs.   I reviewed her chart and she also has a history of HTN, HL, obesity class 2.   ROS: Constitutional: no weight gain, increased appetite, no fatigue, no subjective hyperthermia Eyes: no blurry vision, no xerophthalmia ENT: no sore throat, no nodules palpated in throat, no dysphagia/no odynophagia, no hoarseness Cardiovascular: no CP/SOB/palpitations/leg swelling Respiratory: no cough/SOB Gastrointestinal: no N/V/D/C/GERD Musculoskeletal: no muscle/joint aches Skin: no rashes Neurological: no tremors/numbness/tingling/dizziness  I reviewed pt's medications, allergies, PMH, social hx, family hx, and changes were documented in the history of present illness. Otherwise, unchanged from my initial visit note.  PE: BP 124/76 mmHg  Pulse 97  Temp(Src)  98.7 F (37.1 C) (Oral)  Resp 12  Wt 210 lb (95.255 kg)  SpO2 97% Wt Readings from Last 3 Encounters:  09/25/14 210 lb (95.255 kg)  08/01/14 213 lb (96.616 kg)  06/03/14 214 lb 12.8 oz (97.433 kg)   Constitutional: overweight, in NAD Eyes: PERRLA, EOMI, no exophthalmos ENT: moist mucous membranes, no thyromegaly, no cervical lymphadenopathy Cardiovascular: RRR, No MRG Respiratory: CTA B Gastrointestinal: abdomen soft, NT, ND, BS+ Musculoskeletal: no deformities, strength intact in all 4 Skin: moist, warm, no rashes.  Neurological: no tremor with outstretched hands, DTR normal in all 4  ASSESSMENT: 1. DM2, insulin-dependent, controlled, without  complications  2. HTG  PLAN:  1.  Patient with improving diabetes control in last months.Her sugars were doing great on lower doses of insulin during her Mediterranean diet. She plans to go back to that. - I advised her to continue insulin as follows:  Patient Instructions  Please stop at the lab.  Please continue: - Metformin 1000 mg 2x a day - Lantus 35 units at night - NovoLog:  - 10-13 units for a small meal  - 13-16 units for a larger meal   If you switch back to the Mediterranean diet, decrease the insulin as follows: - Metformin 1000 mg 2x a day - Lantus 30 units at night - NovoLog:  - 8-10 units for a small meal  - 10-12 units for a larger meal   Please come back for a follow-up appointment in 3 months   - up to date with eye exams - check HbA1c today - RTC in 3 mo  2. Hypertriglyceridemia - on Lopid 600 mg twice a day, and  omega-3 fatty acids 1g 2x a day - TG improved per last check - we reviewed this together  Office Visit on 09/25/2014  Component Date Value Ref Range Status  . Hgb A1c MFr Bld 09/25/2014 5.9* 4.8 - 5.6 % Final   Comment:          Pre-diabetes: 5.7 - 6.4          Diabetes: >6.4          Glycemic control for adults with diabetes: <7.0   . specimen status report 09/25/2014 Comment   Final   Comment: Written Authorization Written Authorization Written Authorization Received. Authorization received from original req 09-26-2014 Logged by Acie Fredrickson   Excellent HbA1c!

## 2014-09-26 ENCOUNTER — Other Ambulatory Visit: Payer: Self-pay | Admitting: Internal Medicine

## 2014-09-27 LAB — HGB A1C W/O EAG: Hgb A1c MFr Bld: 5.9 % — ABNORMAL HIGH (ref 4.8–5.6)

## 2014-09-27 LAB — SPECIMEN STATUS REPORT

## 2014-10-03 ENCOUNTER — Encounter: Payer: BLUE CROSS/BLUE SHIELD | Admitting: Family Medicine

## 2014-10-09 ENCOUNTER — Telehealth: Payer: Self-pay

## 2014-10-09 ENCOUNTER — Telehealth: Payer: Self-pay | Admitting: Internal Medicine

## 2014-10-09 MED ORDER — METFORMIN HCL 1000 MG PO TABS: ORAL_TABLET | ORAL | Status: DC

## 2014-10-09 NOTE — Telephone Encounter (Signed)
Pt states Dr.Mcpherson is the one that have been managing her Diabetic medicine. Please call [phone] and she need it today     [facility] [address]

## 2014-10-09 NOTE — Telephone Encounter (Signed)
Called pt and advised her per Dr Charlean SanfilippoGherghe's note. Pt pleased. Pt asked if Dr Elvera LennoxGherghe would take over the prescribing of her Glucophage? Please advise.

## 2014-10-09 NOTE — Telephone Encounter (Signed)
Lab results, please advise 

## 2014-10-09 NOTE — Telephone Encounter (Signed)
I had faxed back a note to pharm asking them to send a req to Dr Charlean SanfilippoGherghe's office to see if they will start Rxing this for pt since she is seeing pt regularly for her DM and should really be the provider to manage this in case she wants to make changes to treatment. I don't want pt to be without her medication though, and Dr Audria NineMcPherson has been Rxing it, so I sent in 1 mos RF. I called pt to let her know about RF and discussed reasoning behind suggesting that Dr Elvera LennoxGherghe would be the most appropriate doctor to manage her DM meds. Pt agreed and she will talk to both Drs Elvera LennoxGherghe and Audria NineMcPherson at upcoming appts. Dr Audria NineMcPherson, [place] ChildesFYI.

## 2014-10-09 NOTE — Telephone Encounter (Signed)
Yes, absolutely.

## 2014-10-09 NOTE — Telephone Encounter (Signed)
Pt called requesting lab results, please advise.

## 2014-10-09 NOTE — Telephone Encounter (Signed)
Patient is completely out of metformin and needs a refill. She says she requested from the pharmacy 2 days ago

## 2014-10-09 NOTE — Telephone Encounter (Signed)
I am so sorry, I was 100% sure that I gave her the great news. Hemoglobin A1c was 5.9!

## 2014-10-09 NOTE — Telephone Encounter (Signed)
Patient would like her results    Call back: [phone]  Please advise    Thank you

## 2014-10-10 MED ORDER — METFORMIN HCL 1000 MG PO TABS: ORAL_TABLET | ORAL | Status: DC

## 2014-10-10 NOTE — Addendum Note (Signed)
Addended by: Adline MangoALLICUTT, Pascal Stiggers B on: 10/10/2014 08:39 AM   Modules accepted: Orders

## 2014-10-10 NOTE — Telephone Encounter (Signed)
This was sent in yesterday. Pt aware.

## 2014-11-06 ENCOUNTER — Other Ambulatory Visit: Payer: Self-pay | Admitting: *Deleted

## 2014-11-06 MED ORDER — METFORMIN HCL 1000 MG PO TABS: ORAL_TABLET | ORAL | Status: DC

## 2014-11-06 NOTE — Telephone Encounter (Signed)
error 

## 2014-11-14 ENCOUNTER — Ambulatory Visit (INDEPENDENT_AMBULATORY_CARE_PROVIDER_SITE_OTHER): Payer: BLUE CROSS/BLUE SHIELD | Admitting: Family Medicine

## 2014-11-14 ENCOUNTER — Encounter: Payer: Self-pay | Admitting: Family Medicine

## 2014-11-14 VITALS — BP 106/68 | HR 81 | Temp 98.0°F | Resp 16 | Ht 65.5 in | Wt 214.4 lb

## 2014-11-14 DIAGNOSIS — Z124 Encounter for screening for malignant neoplasm of cervix: Secondary | ICD-10-CM | POA: Diagnosis not present

## 2014-11-14 DIAGNOSIS — I10 Essential (primary) hypertension: Secondary | ICD-10-CM

## 2014-11-14 DIAGNOSIS — E119 Type 2 diabetes mellitus without complications: Secondary | ICD-10-CM | POA: Diagnosis not present

## 2014-11-14 DIAGNOSIS — Z01419 Encounter for gynecological examination (general) (routine) without abnormal findings: Secondary | ICD-10-CM

## 2014-11-14 DIAGNOSIS — Z0000 Encounter for general adult medical examination without abnormal findings: Secondary | ICD-10-CM | POA: Diagnosis not present

## 2014-11-14 DIAGNOSIS — E781 Pure hyperglyceridemia: Secondary | ICD-10-CM | POA: Diagnosis not present

## 2014-11-14 MED ORDER — LOSARTAN POTASSIUM 50 MG PO TABS: 50.0000 mg | ORAL_TABLET | Freq: Every day | ORAL | Status: DC

## 2014-11-14 MED ORDER — GEMFIBROZIL 600 MG PO TABS: 600.0000 mg | ORAL_TABLET | Freq: Two times a day (BID) | ORAL | Status: DC

## 2014-11-14 NOTE — Progress Notes (Signed)
Subjective:    Patient ID: Carolyn Butler, female    DOB: Feb 15, 1971, 44 y.o.   MRN: [id number]  HPI   This 44 y.o female is here for CPE/PAP w/ pelvic exam. Chronic medical issues include stable, well controlled HTN , lipid disorder and and Type II DM (last A1c= 5.9% in March 2016) managed by Dr. Elvera Lennox. Pt is compliant w/ medications w/o adverse effects.  HCM: MMG- Pt to call and reschedule (ordered in Jan 2016).           PAP- > 3 years ago.           IMM- Tdap status?           Vision- Current per pt;             Patient Active Problem List   Diagnosis Date Noted  . Pseudohyponatremia 12/14/2012  . Hypertriglyceridemia 12/14/2012  . Type 2 diabetes mellitus, uncontrolled 11/20/2011  . Benign essential HTN 11/20/2011  . Obesity, Class II, BMI 35-39.9, with comorbidity 11/20/2011  . Sleep disturbance 11/20/2011    Prior to Admission medications   Medication Sig Start Date End Date Taking? Authorizing Provider  aspirin 81 MG tablet Take 81 mg by mouth daily.   Yes Historical Provider, MD  cyclobenzaprine (FLEXERIL) 10 MG tablet TAKE ONE TABLET BY MOUTH AT BEDTIME AS NEEDED FOR MUSCLE SPASM 08/01/14  Yes Maurice March, MD  gemfibrozil (LOPID) 600 MG tablet Take 1 tablet (600 mg total) by mouth 2 (two) times daily with a meal.   Yes Maurice March, MD  glucose blood (ONE TOUCH ULTRA TEST) test strip Use to test blood sugar 3 times daily as instructed. 11/26/13  Yes Carlus Pavlov, MD  insulin aspart (NOVOLOG FLEXPEN) 100 UNIT/ML FlexPen Inject under skin up to 50 units daily as advised. 02/07/14  Yes Carlus Pavlov, MD  Insulin Glargine (LANTUS SOLOSTAR) 100 UNIT/ML Solostar Pen Inject 35 Units into the skin at bedtime. 02/07/14  Yes Carlus Pavlov, MD  Insulin Pen Needle 32G X 4 MM MISC Use to inject insulin 4 times a day as instructed. 09/17/14  Yes Carlus Pavlov, MD  losartan (COZAAR) 50 MG tablet Take 1 tablet (50 mg total) by mouth daily.   Yes Maurice March, MD  metFORMIN (GLUCOPHAGE) 1000 MG tablet TAKE ONE TABLET BY MOUTH TWICE DAILY. 11/06/14  Yes Carlus Pavlov, MD  Multiple Vitamin (MULTIVITAMIN) tablet Take 1 tablet by mouth daily.   Yes Historical Provider, MD  NOVOLOG FLEXPEN 100 UNIT/ML FlexPen INJECT UNDER SKIN UP TO 30 UNITS DAILY AS ADVISED 09/26/14  Yes Carlus Pavlov, MD  Omega-3 Fatty Acids (OMEGA 3 PO) Take by mouth 2 (two) times daily with a meal.   Yes Historical Provider, MD  VITAMIN E PO Take by mouth daily.   Yes Historical Provider, MD      Past Surgical History  Procedure Laterality Date  . Surgery on right knee      History   Social History  . Marital Status: Married    Spouse Name: N/A  . Number of Children: N/A  . Years of Education: N/A   Occupational History  . 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   Social History Main Topics  . Smoking status: Never Smoker   . Smokeless tobacco: Never Used  . Alcohol Use: No     Comment: twice a year mixed drink or red wine  . Drug Use: No  . Sexual Activity: Yes  Other Topics Concern  . Not on file   Social History Narrative    Family History  Problem Relation Age of Onset  . Diabetes Father   . Heart disease Father   . Hyperlipidemia Father   . Diabetes Paternal Grandmother   . Heart disease Paternal Grandmother   . Diabetes Brother              Review of Systems  Constitutional: Negative.   HENT: Negative.   Eyes: Negative.   Respiratory: Negative.   Cardiovascular: Negative.   Gastrointestinal: Negative.   Endocrine: Negative.   Genitourinary: Negative.   Musculoskeletal: Negative.   Skin: Negative.   Allergic/Immunologic: Negative.   Neurological: Negative.   Hematological: Negative.   Psychiatric/Behavioral: Negative.       Objective:   Physical Exam  Constitutional: She is oriented to person, place, and time. Vital signs are normal. She appears well-developed and well-nourished. No distress.  Blood  pressure 106/68, pulse 81, temperature 98 F (36.7 C), temperature source Oral, resp. rate 16, height 5' 5.5" (1.664 m), weight 214 lb 6.4 oz (97.251 kg), SpO2 100 %.   HENT:  Head: Normocephalic and atraumatic.  Right Ear: Hearing, tympanic membrane, external ear and ear canal normal.  Left Ear: Hearing, tympanic membrane, external ear and ear canal normal.  Nose: Nose normal. No nasal deformity or septal deviation.  Mouth/Throat: Uvula is midline, oropharynx is clear and moist and mucous membranes are normal. No oral lesions. Normal dentition. No uvula swelling.  Eyes: Conjunctivae, EOM and lids are normal. Pupils are equal, round, and reactive to light. No scleral icterus.  Neck: Trachea normal, normal range of motion, full passive range of motion without pain and phonation normal. Neck supple. No spinous process tenderness and no muscular tenderness present. No thyroid mass and no thyromegaly present.  Cardiovascular: Normal rate, regular rhythm, S1 normal, S2 normal, normal heart sounds, intact distal pulses and normal pulses.   No extrasystoles are present. PMI is not displaced.  Exam reveals no gallop and no friction rub.   No murmur heard. Pulmonary/Chest: Effort normal and breath sounds normal. No respiratory distress. She has no decreased breath sounds. She has no wheezes. Right breast exhibits no inverted nipple, no mass, no nipple discharge, no skin change and no tenderness. Left breast exhibits no inverted nipple, no mass, no nipple discharge, no skin change and no tenderness. Breasts are symmetrical.  Abdominal: Soft. Normal appearance and bowel sounds are normal. She exhibits no distension and no mass. There is no hepatosplenomegaly. There is no tenderness. There is no guarding and no CVA tenderness.  Genitourinary: Vagina normal and uterus normal. There is no rash, tenderness or lesion on the right labia. There is no rash, tenderness or lesion on the left labia. Cervix exhibits no  motion tenderness, no discharge and no friability. Right adnexum displays no mass, no tenderness and no fullness. Left adnexum displays no mass, no tenderness and no fullness. No erythema, tenderness or bleeding in the vagina. No signs of injury around the vagina. No vaginal discharge found.  Musculoskeletal:       Cervical back: Normal.       Thoracic back: Normal.       Lumbar back: Normal.  Remainder of exam unremarkable.  Lymphadenopathy:       Head (right side): No submental, no submandibular, no tonsillar, no preauricular, no posterior auricular and no occipital adenopathy present.       Head (left side): No submental, no submandibular, no  tonsillar, no preauricular, no posterior auricular and no occipital adenopathy present.    She has no cervical adenopathy.    She has no axillary adenopathy.       Right: No inguinal and no supraclavicular adenopathy present.       Left: No inguinal and no supraclavicular adenopathy present.  Neurological: She is alert and oriented to person, place, and time. She has normal strength and normal reflexes. She displays no atrophy. No cranial nerve deficit or sensory deficit. She exhibits normal muscle tone. Coordination and gait normal.  Skin: Skin is warm, dry and intact. No ecchymosis, no lesion and no rash noted. She is not diaphoretic. No cyanosis or erythema. No pallor. Nails show no clubbing.  Psychiatric: She has a normal mood and affect. Her speech is normal and behavior is normal. Judgment and thought content normal. Cognition and memory are normal.  Nursing note and vitals reviewed.      Assessment & Plan:  Laboratory examination ordered as part of a routine general medical examination  Encounter for cervical Pap smear with pelvic exam - Plan: Pap IG (Image Guided)  Hypertriglyceridemia - Continue gemfibrozil 600 mg bid.  Recommended book "Eat Fat, Get Thin" by Dr. Keturah BarreHymen. Plan: COMPLETE METABOLIC PANEL WITH GFR, Lipid panel, Vitamin D,  25-hydroxy  Benign essential HTN - Stable and well controlled on current medication. Plan: COMPLETE METABOLIC PANEL WITH GFR  Diabetes mellitus without complication - Excellent control; follow-up w/ Dr. Elvera LennoxGherghe. Plan: HM Diabetes Foot Exam, Microalbumin, urine, Vitamin D, 25-hydroxy   Meds ordered this encounter  Medications  . losartan (COZAAR) 50 MG tablet    Sig: Take 1 tablet (50 mg total) by mouth daily.    Dispense:  30 tablet    Refill:  5  . gemfibrozil (LOPID) 600 MG tablet    Sig: Take 1 tablet (600 mg total) by mouth 2 (two) times daily with a meal.    Dispense:  60 tablet    Refill:  5

## 2014-11-14 NOTE — Patient Instructions (Signed)
Keeping You Healthy  Get These Tests  Blood Pressure- Have your blood pressure checked once a year by your health care provider.  Normal blood pressure is 120/80.  Weight- Have your body mass index (BMI) calculated to screen for obesity.  BMI is measure of body fat based on height and weight.  You can also calculate your own BMI at https://www.west-esparza.com/.  Cholesterol- Have your cholesterol checked every 5 years starting at age 45 then yearly starting at age 75.  Chlamydia, HIV, and other sexually transmitted diseases- Get screened every year until age 55, then within three months of each new sexual provider.  Pap Smear- Every 1-5 years; discuss with your health care provider.  Mammogram- Every 1-2 years starting at age 30--50  Take these medicines  Calcium with Vitamin D-Your body needs 1200 mg of Calcium each day and [phone] IU of Vitamin D daily.  Your body can only absorb 500 mg of Calcium at a time so Calcium must be taken in 2 or 3 divided doses throughout the day.  Multivitamin with folic acid- Once daily if it is possible for you to become pregnant.  Get these Immunizations  Gardasil-Series of three doses; prevents HPV related illness such as genital warts and cervical cancer.  Menactra-Single dose; prevents meningitis.  Tetanus shot- Every 10 years.  Flu shot-Every year.  Take these steps  Do not smoke-Your healthcare provider can help you quit.  For tips on how to quit go to www.smokefree.gov or call 1-800 QUITNOW.  Be physically active- Exercise 5 days a week for at least 30 minutes.  If you are not already physically active, start slow and gradually work up to 30 minutes of moderate physical activity.  Examples of moderate activity include walking briskly, dancing, swimming, bicycling, etc.  Breast Cancer- A self breast exam every month is important for early detection of breast cancer.  For more information and instruction on self breast exams, ask your  healthcare provider or [email or web address].  Eat a healthy diet- Eat a variety of healthy foods such as fruits, vegetables, whole grains, low fat milk, low fat cheeses, yogurt, lean meats, poultry and fish, beans, nuts, tofu, etc.  For more information go to www. Thenutritionsource.org  Drink alcohol in moderation- Limit alcohol intake to one drink or less per day. Never drink and drive.  Depression- Your emotional health is as important as your physical health.  If you're feeling down or losing interest in things you normally enjoy please talk to your healthcare provider about being screened for depression.  Dental visit- Brush and floss your teeth twice daily; visit your dentist twice a year.  Eye doctor- Get an eye exam at least every 2 years.  Helmet use- Always wear a helmet when riding a bicycle, motorcycle, rollerblading or skateboarding.  Safe sex- If you may be exposed to sexually transmitted infections, use a condom.  Seat belts- Seat belts can save your live; always wear one.  Smoke/Carbon Monoxide detectors- These detectors need to be installed on the appropriate level of your home. Replace batteries at least once a year.  Skin cancer- When out in the sun please cover up and use sunscreen 15 SPF or higher.  Violence- If anyone is threatening or hurting you, please tell your healthcare provider.    Tdap Vaccine (Tetanus, Diphtheria, Pertussis): What You Need to Know 1. Why get vaccinated? Tetanus, diphtheria and pertussis can be very serious diseases, even for adolescents and adults. Tdap vaccine can protect [place] from these diseases.  TETANUS (Lockjaw) causes painful muscle tightening and stiffness, usually all over the body.  It can lead to tightening of muscles in the head and neck so you can't open your mouth, swallow, or sometimes even breathe. Tetanus kills about 1 out of 5 people who are infected. DIPHTHERIA can cause a thick coating to form in  the back of the throat.  It can lead to breathing problems, paralysis, heart failure, and death. PERTUSSIS (Whooping Cough) causes severe coughing spells, which can cause difficulty breathing, vomiting and disturbed sleep.  It can also lead to weight loss, incontinence, and rib fractures. Up to 2 in 100 adolescents and 5 in 100 adults with pertussis are hospitalized or have complications, which could include pneumonia or death. These diseases are caused by bacteria. Diphtheria and pertussis are spread from person to person through coughing or sneezing. Tetanus enters the body through cuts, scratches, or wounds. Before vaccines, the [place] saw as many as 200,000 cases a year of diphtheria and pertussis, and hundreds of cases of tetanus. Since vaccination began, tetanus and diphtheria have dropped by about 99% and pertussis by about 80%. 2. Tdap vaccine Tdap vaccine can protect adolescents and adults from tetanus, diphtheria, and pertussis. One dose of Tdap is routinely given at age 51 or 20. People who did not get Tdap at that age should get it as soon as possible. Tdap is especially important for health care professionals and anyone having close contact with a baby younger than 12 months. Pregnant women should get a dose of Tdap during every pregnancy, to protect the newborn from pertussis. Infants are most at risk for severe, life-threatening complications from pertussis. A similar vaccine, called Td, protects from tetanus and diphtheria, but not pertussis. A Td booster should be given every 10 years. Tdap may be given as one of these boosters if you have not already gotten a dose. Tdap may also be given after a severe cut or burn to prevent tetanus infection. Your doctor can give you more information. Tdap may safely be given at the same time as other vaccines. 3. Some people should not get this vaccine  If you ever had a life-threatening allergic reaction after a dose of any tetanus,  diphtheria, or pertussis containing vaccine, OR if you have a severe allergy to any part of this vaccine, you should not get Tdap. Tell your doctor if you have any severe allergies.  If you had a coma, or long or multiple seizures within 7 days after a childhood dose of DTP or DTaP, you should not get Tdap, unless a cause other than the vaccine was found. You can still get Td.  Talk to your doctor if you:  have epilepsy or another nervous system problem,  had severe pain or swelling after any vaccine containing diphtheria, tetanus or pertussis,  ever had Guillain-Barr Syndrome (GBS),  aren't feeling well on the day the shot is scheduled. 4. Risks of a vaccine reaction With any medicine, including vaccines, there is a chance of side effects. These are usually mild and go away on their own, but serious reactions are also possible. Brief fainting spells can follow a vaccination, leading to injuries from falling. Sitting or lying down for about 15 minutes can help prevent these. Tell your doctor if you feel dizzy or light-headed, or have vision changes or ringing in the ears. Mild problems following Tdap (Did not interfere with activities)  Pain where the shot was given (about 3 in 4 adolescents or 2  in 3 adults)  Redness or swelling where the shot was given (about 1 person in 5)  Mild fever of at least 100.40F (up to about 1 in 25 adolescents or 1 in 100 adults)  Headache (about 3 or 4 people in 10)  Tiredness (about 1 person in 3 or 4)  Nausea, vomiting, diarrhea, stomach ache (up to 1 in 4 adolescents or 1 in 10 adults)  Chills, body aches, sore joints, rash, swollen glands (uncommon) Moderate problems following Tdap (Interfered with activities, but did not require medical attention)  Pain where the shot was given (about 1 in 5 adolescents or 1 in 100 adults)  Redness or swelling where the shot was given (up to about 1 in 16 adolescents or 1 in 25 adults)  Fever over 102F  (about 1 in 100 adolescents or 1 in 250 adults)  Headache (about 3 in 20 adolescents or 1 in 10 adults)  Nausea, vomiting, diarrhea, stomach ache (up to 1 or 3 people in 100)  Swelling of the entire arm where the shot was given (up to about 3 in 100). Severe problems following Tdap (Unable to perform usual activities; required medical attention)  Swelling, severe pain, bleeding and redness in the arm where the shot was given (rare). A severe allergic reaction could occur after any vaccine (estimated less than 1 in a million doses). 5. What if there is a serious reaction? What should I look for?  Look for anything that concerns you, such as signs of a severe allergic reaction, very high fever, or behavior changes. Signs of a severe allergic reaction can include hives, swelling of the face and throat, difficulty breathing, a fast heartbeat, dizziness, and weakness. These would start a few minutes to a few hours after the vaccination. What should I do?  If you think it is a severe allergic reaction or other emergency that can't wait, call 9-1-1 or get the person to the nearest hospital. Otherwise, call your doctor.  Afterward, the reaction should be reported to the "Vaccine Adverse Event Reporting System" (VAERS). Your doctor might file this report, or you can do it yourself through the VAERS web site at www.vaers.LAgents.nohhs.gov, or by calling 1-423-527-8531. VAERS is only for reporting reactions. They do not give medical advice.  6. The National Vaccine Injury Compensation Program The [employer] Vaccine Injury Compensation Program (VICP) is a federal program that was created to compensate people who may have been injured by certain vaccines. Persons who believe they may have been injured by a vaccine can learn about the program and about filing a claim by calling 1-787-189-0490 or visiting the VICP website at SpiritualWord.atwww.hrsa.gov/vaccinecompensation. 7. How can I learn more?  Ask your doctor.  Call your  local or state health department.  Contact the Centers for Disease Control and Prevention (CDC):  Call 514-660-70581-680-124-2293 or visit CDC's website at PicCapture.uywww.cdc.gov/vaccines. CDC Tdap Vaccine VIS (12/02/11) Document Released: 01/11/2012 Document Revised: 11/26/2013 Document Reviewed: 10/24/2013 ExitCare Patient Information 2015 [employer]. This information is not intended to replace advice given to you by your health care provider. Make sure you discuss any questions you have with your health care provider.    Please call [facility] and schedule your mammogram- ph # [phone].   Check out this book by Dr. Keturah BarreHymen - "EAT FAT, GET THIN".

## 2014-11-15 LAB — COMPLETE METABOLIC PANEL WITH GFR
ALT: 31 U/L (ref 0–35)
AST: 18 U/L (ref 0–37)
Albumin: 4.3 g/dL (ref 3.5–5.2)
Alkaline Phosphatase: 22 U/L — ABNORMAL LOW (ref 39–117)
BILIRUBIN TOTAL: 0.4 mg/dL (ref 0.2–1.2)
BUN: 10 mg/dL (ref 6–23)
CO2: 25 mEq/L (ref 19–32)
CREATININE: 0.49 mg/dL — AB (ref 0.50–1.10)
Calcium: 9.3 mg/dL (ref 8.4–10.5)
Chloride: 99 mEq/L (ref 96–112)
GFR, Est Non African American: >89 mL/min
GLUCOSE: 108 mg/dL — AB (ref 70–99)
Potassium: 4.3 mEq/L (ref 3.5–5.3)
Sodium: 136 mEq/L (ref 135–145)
TOTAL PROTEIN: 7.3 g/dL (ref 6.0–8.3)

## 2014-11-15 LAB — LIPID PANEL
CHOLESTEROL: 156 mg/dL (ref 0–200)
HDL: 24 mg/dL — ABNORMAL LOW (ref >=46)
LDL Cholesterol: 102 mg/dL — ABNORMAL HIGH (ref 0–99)
TRIGLYCERIDES: 148 mg/dL (ref <150)
Total CHOL/HDL Ratio: 6.5 Ratio
VLDL: 30 mg/dL (ref 0–40)

## 2014-11-15 LAB — MICROALBUMIN, URINE: Microalb, Ur: 1.6 mg/dL (ref <2.0)

## 2014-11-15 LAB — VITAMIN D 25 HYDROXY (VIT D DEFICIENCY, FRACTURES): VIT D 25 HYDROXY: 32 ng/mL (ref 30–100)

## 2014-11-16 ENCOUNTER — Encounter: Payer: Self-pay | Admitting: Family Medicine

## 2014-11-17 NOTE — Progress Notes (Signed)
Quick Note:  Please advise pt regarding following labs...  Labs are stable. Lipid panel shows low HDL ("good") cholesterol is very low. Consistent exercise and consuming "good fats" (as per our discussion about the book "Eat Fat, Get Thin") can help improve your numbers. Continue current medication; ask Dr. Elvera LennoxGherghe if she recommends you take a different medication for lipid disorder when you see her in June.  Vitamin D needs to be increased. Get an over-the-counter Vitamin D3 supplement 2000 units and take 1 capsule daily. Some sun exposure for 10 minutes most days of the week is important also.  Copy to pt. ______

## 2014-11-18 LAB — PAP IG (IMAGE GUIDED)

## 2014-11-20 NOTE — Progress Notes (Signed)
Quick Note:  Notify pt of Normal results. ______ 

## 2014-12-26 ENCOUNTER — Ambulatory Visit: Payer: BLUE CROSS/BLUE SHIELD | Admitting: Internal Medicine

## 2015-01-03 ENCOUNTER — Encounter: Payer: Self-pay | Admitting: Internal Medicine

## 2015-01-03 ENCOUNTER — Ambulatory Visit (INDEPENDENT_AMBULATORY_CARE_PROVIDER_SITE_OTHER): Payer: BLUE CROSS/BLUE SHIELD | Admitting: Internal Medicine

## 2015-01-03 VITALS — BP 114/62 | HR 93 | Temp 98.8°F | Resp 12 | Wt 215.0 lb

## 2015-01-03 DIAGNOSIS — E781 Pure hyperglyceridemia: Secondary | ICD-10-CM | POA: Diagnosis not present

## 2015-01-03 DIAGNOSIS — E1165 Type 2 diabetes mellitus with hyperglycemia: Secondary | ICD-10-CM | POA: Diagnosis not present

## 2015-01-03 DIAGNOSIS — IMO0002 Reserved for concepts with insufficient information to code with codable children: Secondary | ICD-10-CM

## 2015-01-03 LAB — HEMOGLOBIN A1C: HEMOGLOBIN A1C: 5.7 % (ref 4.6–6.5)

## 2015-01-03 NOTE — Progress Notes (Signed)
Patient ID: Carolyn Butler, female   DOB: 09/09/1970, 44 y.o.   MRN: [id number]  HPI: Carolyn Butler is a 44 y.o.-year-old female, returning for f/u for DM2, dx 2005, insulin-dependent since 01/2013, controlled, without complications. Last visit was 3 mo ago.   She gained 5 lbs since last visit.  Last hemoglobin A1c was: Lab Results  Component Value Date   HGBA1C 5.9* 09/25/2014   HGBA1C 6.2 06/03/2014   HGBA1C 9.3* 09/21/2013  Previous A1c was 8.4% in 06/2012.  She has a h/o med noncompliance per PCPs notes.    Pt is on a regimen of: - Metformin 1000 mg po bid - Lantus 35 units at night - NovoLog:  - 10-13 units for a small meal  - 13-16 units for a larger meal  Pt ws on Amaryl in the past and gained 15 lbs >> stopped it and lost the weight We stopped Januvia 100 mg in 01/2013 b/c increased TG and high risk of Pancreatitis.   Pt checks her sugars 3x a day and they are still good: - am: 275-330 >> 264-301 >> 130-170 (187) >> 139-170 >> 123-155 >> 104-120s >> 110-131 - 2h after breakfast: n/c >> 206, 240 >> n/c - before lunch: 250s >> n/c >> 145-190 >> 148-185 >> 77 x1 >> 120s >> 88, 99 - 2h after lunch: 282 >> 260, 154, 294 >> 147-199 >> 120-136 >> 120-150, 163 >> 100-128 - before dinner:321, 388 (in 01/2014) >> n/c >> 135-206 >> 114-164 >> n/c - bedtime: 198 >> 158-259 >> 200 x1 >> 160s, 179 >> 104, 141 No lows. Lowest sugar was 87; she has hypoglycemia awareness at 80. Highest sugar was 240 >> 200 >> 179 >> 140s.  Meals (will go back to the mediterranean diet):  - b'fast: 1 egg + toast - lunch: salad + meat or fish - dinner: spaghetti, adds veggies or salads but not always Smaller portions.   Pt does not have chronic kidney disease, last BUN/creatinine was:  Lab Results  Component Value Date   BUN 10 11/14/2014   CREATININE 0.49* 11/14/2014  On Losartan.  Last set of lipids - nonfasting: Lab Results  Component Value Date   CHOL 156 11/14/2014   HDL 24* 11/14/2014    LDLCALC 102* 11/14/2014   LDLDIRECT 86.8 02/16/2013   TRIG 148 11/14/2014   CHOLHDL 6.5 11/14/2014   Previously: Lab Results  Component Value Date   CHOL 170 05/09/2013   HDL 28* 05/09/2013   LDLCALC Comment:   Not calculated due to Triglyceride >400.  05/09/2013   LDLDIRECT 86.8 02/16/2013   TRIG 790* 05/09/2013   CHOLHDL 6.1 05/09/2013  On omega 3 FA - 1 g 2x /day - she is taking it now. She is also on 600 mg bid >> TG decreased from 4500 to 790 to 170!  Pt's last eye exam was in 03/2014. No DR.  Denies numbness and tingling in her legs.   I reviewed her chart and she also has a history of HTN, HL, obesity class 2.   ROS: Constitutional: + weight gain, + increased appetite, no fatigue, + subjective hyperthermia, + poor sleep Eyes: no blurry vision, no xerophthalmia ENT: no sore throat, no nodules palpated in throat, no dysphagia/no odynophagia, no hoarseness Cardiovascular: no CP/SOB/palpitations/+ leg swelling Respiratory: no cough/SOB Gastrointestinal: no N/V/D/C/GERD Musculoskeletal: no muscle/joint aches Skin: no rashes, + hair loss Neurological: no tremors/numbness/tingling/dizziness, + HA  I reviewed pt's medications, allergies, PMH, social hx, family hx, and changes  were documented in the history of present illness. Otherwise, unchanged from my initial visit note.  PE: BP 114/62 mmHg  Pulse 93  Temp(Src) 98.8 F (37.1 C) (Oral)  Resp 12  Wt 215 lb (97.523 kg)  SpO2 97% Body mass index is 35.22 kg/(m^2). Wt Readings from Last 3 Encounters:  01/03/15 215 lb (97.523 kg)  11/14/14 214 lb 6.4 oz (97.251 kg)  09/25/14 210 lb (95.255 kg)   Constitutional: overweight, in NAD Eyes: PERRLA, EOMI, no exophthalmos ENT: moist mucous membranes, no thyromegaly, no cervical lymphadenopathy Cardiovascular: RRR, No MRG Respiratory: CTA B Gastrointestinal: abdomen soft, NT, ND, BS+ Musculoskeletal: no deformities, strength intact in all 4 Skin: moist, warm, no rashes.   Neurological: no tremor with outstretched hands, DTR normal in all 4  ASSESSMENT: 1. DM2, insulin-dependent, controlled, without complications  2. HTG  PLAN:  1.  Patient with improving diabetes control in last months. Her sugars were doing great on lower doses of insulin. Will not change regimen. - I advised her to continue insulin as follows:  Patient Instructions  Please stop at the lab.  Please continue: - Metformin 1000 mg 2x a day - Lantus 35 units at night - NovoLog:  - 10-13 units for a small meal  - 13-16 units for a larger meal   Please come back for a follow-up appointment in 3 months   - up to date with eye exams - check HbA1c today - RTC in 3 mo  2. Hypertriglyceridemia - on Lopid 600 mg twice a day, and  omega-3 fatty acids 1g 2x a day - TG much improved per last check - we reviewed this together  Office Visit on 01/03/2015  Component Date Value Ref Range Status  . Hgb A1c MFr Bld 01/03/2015 5.7  4.6 - 6.5 % Final   Glycemic Control Guidelines for People with Diabetes:Non Diabetic:  <6%Goal of Therapy: <7%Additional Action Suggested:  >8%    HbA1c improved further.

## 2015-01-03 NOTE — Patient Instructions (Signed)
Please stop at the lab.  Please continue: - Metformin 1000 mg 2x a day - Lantus 35 units at night - NovoLog:  - 10-13 units for a small meal  - 13-16 units for a larger meal   Please come back for a follow-up appointment in 3 months

## 2015-01-17 ENCOUNTER — Other Ambulatory Visit: Payer: Self-pay | Admitting: Internal Medicine

## 2015-01-21 ENCOUNTER — Other Ambulatory Visit: Payer: Self-pay | Admitting: *Deleted

## 2015-01-21 NOTE — Telephone Encounter (Signed)
Opened encounter in order.

## 2015-02-03 ENCOUNTER — Telehealth: Payer: Self-pay | Admitting: Internal Medicine

## 2015-02-03 NOTE — Telephone Encounter (Signed)
Can you call pt back she is calling to follow up to previous conversation

## 2015-02-04 ENCOUNTER — Other Ambulatory Visit: Payer: Self-pay | Admitting: *Deleted

## 2015-02-04 MED ORDER — INSULIN GLARGINE 100 UNIT/ML SOLOSTAR PEN: 45.0000 [IU] | PEN_INJECTOR | Freq: Every day | SUBCUTANEOUS | Status: DC

## 2015-02-04 NOTE — Telephone Encounter (Signed)
Called pt and she said that she spoke with her ins co. She said she was requesting her insulin too soon, per ins and they would not cover it. Pt's dosage has changed. We made the change in the dosage and sent a new rx to her pharmacy.

## 2015-03-14 ENCOUNTER — Other Ambulatory Visit: Payer: Self-pay | Admitting: Internal Medicine

## 2015-03-21 ENCOUNTER — Other Ambulatory Visit: Payer: Self-pay | Admitting: *Deleted

## 2015-03-21 MED ORDER — INSULIN GLARGINE 100 UNIT/ML SOLOSTAR PEN: PEN_INJECTOR | SUBCUTANEOUS | Status: DC

## 2015-03-27 ENCOUNTER — Ambulatory Visit: Payer: BLUE CROSS/BLUE SHIELD | Admitting: Internal Medicine

## 2015-04-18 ENCOUNTER — Other Ambulatory Visit: Payer: Self-pay | Admitting: *Deleted

## 2015-04-18 MED ORDER — INSULIN PEN NEEDLE 32G X 4 MM MISC: Status: DC

## 2015-05-02 ENCOUNTER — Other Ambulatory Visit: Payer: Self-pay | Admitting: Internal Medicine

## 2015-05-31 ENCOUNTER — Other Ambulatory Visit: Payer: Self-pay | Admitting: Internal Medicine

## 2015-07-07 ENCOUNTER — Other Ambulatory Visit: Payer: Self-pay | Admitting: *Deleted

## 2015-07-07 MED ORDER — INSULIN PEN NEEDLE 32G X 4 MM MISC: Status: DC

## 2015-08-06 ENCOUNTER — Telehealth: Payer: Self-pay | Admitting: Internal Medicine

## 2015-08-06 MED ORDER — METFORMIN HCL 1000 MG PO TABS: 1000.0000 mg | ORAL_TABLET | Freq: Two times a day (BID) | ORAL | Status: DC

## 2015-08-06 MED ORDER — LOSARTAN POTASSIUM 50 MG PO TABS: 50.0000 mg | ORAL_TABLET | Freq: Every day | ORAL | Status: DC

## 2015-08-06 MED ORDER — GEMFIBROZIL 600 MG PO TABS: 600.0000 mg | ORAL_TABLET | Freq: Two times a day (BID) | ORAL | Status: DC

## 2015-08-06 NOTE — Telephone Encounter (Signed)
[facility] [address] - [place], [place] - [id number] [phone] (Phone) [phone] (Fax)        is sending over a fax request for Metformin, Gemfibrozil, losartan

## 2015-08-14 ENCOUNTER — Encounter: Payer: Self-pay | Admitting: Internal Medicine

## 2015-08-14 ENCOUNTER — Other Ambulatory Visit (INDEPENDENT_AMBULATORY_CARE_PROVIDER_SITE_OTHER): Payer: BLUE CROSS/BLUE SHIELD | Admitting: *Deleted

## 2015-08-14 ENCOUNTER — Ambulatory Visit (INDEPENDENT_AMBULATORY_CARE_PROVIDER_SITE_OTHER): Payer: BLUE CROSS/BLUE SHIELD | Admitting: Internal Medicine

## 2015-08-14 VITALS — BP 114/80 | HR 87 | Temp 98.2°F | Resp 12 | Wt 216.6 lb

## 2015-08-14 DIAGNOSIS — IMO0001 Reserved for inherently not codable concepts without codable children: Secondary | ICD-10-CM

## 2015-08-14 DIAGNOSIS — Z794 Long term (current) use of insulin: Secondary | ICD-10-CM | POA: Diagnosis not present

## 2015-08-14 DIAGNOSIS — E119 Type 2 diabetes mellitus without complications: Secondary | ICD-10-CM | POA: Insufficient documentation

## 2015-08-14 DIAGNOSIS — E1165 Type 2 diabetes mellitus with hyperglycemia: Secondary | ICD-10-CM | POA: Diagnosis not present

## 2015-08-14 LAB — POCT GLYCOSYLATED HEMOGLOBIN (HGB A1C): Hemoglobin A1C: 5.6

## 2015-08-14 MED ORDER — METFORMIN HCL 1000 MG PO TABS: 1000.0000 mg | ORAL_TABLET | Freq: Two times a day (BID) | ORAL | Status: DC

## 2015-08-14 MED ORDER — INSULIN GLARGINE 100 UNIT/ML SOLOSTAR PEN: PEN_INJECTOR | SUBCUTANEOUS | Status: DC

## 2015-08-14 NOTE — Patient Instructions (Signed)
Please decrease: - Metformin 1000 mg 2x a day - Lantus 35 >> 30 units at night - NovoLog 10-16 units before a meal  Please start Farxiga 10 mg in am.   Please return in 3 months with your sugar log.

## 2015-08-14 NOTE — Progress Notes (Signed)
Patient ID: Carolyn Butler, female   DOB: [birth year], 45 y.o.   MRN: [id number]  HPI: [name] is a 45 y.o.-year-old female, returning for f/u for DM2, dx 2005, insulin-dependent since 01/2013, controlled, without complications. Last visit was 7 mo ago.   Last hemoglobin A1c was: Lab Results  Component Value Date   HGBA1C 5.7 01/03/2015   HGBA1C 5.9* 09/25/2014   HGBA1C 6.2 06/03/2014  Previous A1c was 8.4% in 06/2012.  She has a h/o med noncompliance per PCPs notes.    Pt is on a regimen of: - Metformin 1000 mg po bid - Lantus 35 units at night - NovoLog:  - 10-13 units for a small meal  - 13-16 units for a larger meal  Pt ws on Amaryl in the past and gained 15 lbs >> stopped it and lost the weight We stopped Januvia 100 mg in 01/2013 b/c increased TG and high risk of Pancreatitis.   Pt checks her sugars- only after coming back from [place]: ave 140s, but time is wrong on her meter. Reviewed sugars from last visit: - am: 275-330 >> 264-301 >> 130-170 (187) >> 139-170 >> 123-155 >> 104-120s >> 110-131 - 2h after breakfast: n/c >> 206, 240 >> n/c - before lunch: 250s >> n/c >> 145-190 >> 148-185 >> 77 x1 >> 120s >> 88, 99 - 2h after lunch: 282 >> 260, 154, 294 >> 147-199 >> 120-136 >> 120-150, 163 >> 100-128 - before dinner:321, 388 (in 01/2014) >> n/c >> 135-206 >> 114-164 >> n/c - bedtime: 198 >> 158-259 >> 200 x1 >> 160s, 179 >> 104, 141 No lows. Lowest sugar was 87 >> 104; she has hypoglycemia awareness at 80. Highest sugar was 240 >> 200 >> 179 >> 140s.  Meals (will go back to the mediterranean diet):  - b'fast: 1 egg + toast - lunch: salad + meat or fish - dinner: spaghetti, adds veggies or salads but not always Smaller portions.   Pt does not have chronic kidney disease, last BUN/creatinine was:  Lab Results  Component Value Date   BUN 10 11/14/2014   CREATININE 0.49* 11/14/2014  On Losartan.  Last set of lipids - nonfasting: Lab Results  Component Value  Date   CHOL 156 11/14/2014   HDL 24* 11/14/2014   LDLCALC 102* 11/14/2014   LDLDIRECT 86.8 02/16/2013   TRIG 148 11/14/2014   CHOLHDL 6.5 11/14/2014   Previously: Lab Results  Component Value Date   CHOL 170 05/09/2013   HDL 28* 05/09/2013   LDLCALC Comment:   Not calculated due to Triglyceride >400.  05/09/2013   LDLDIRECT 86.8 02/16/2013   TRIG 790* 05/09/2013   CHOLHDL 6.1 05/09/2013  On omega 3 FA - 1 g 2x /day , Gemfibrozil 600 mg bid >> TG decreased from 4500 to 790 to 170!  Pt's last eye exam was in 03/2014. No DR.  Denies numbness and tingling in her legs.   I reviewed her chart and she also has a history of HTN, HL, obesity class 2.   ROS: Constitutional: + weight loss, then gain, + increased appetite, no fatigue, + subjective hyperthermia Eyes: no blurry vision, no xerophthalmia ENT: no sore throat, no nodules palpated in throat, no dysphagia/no odynophagia, no hoarseness Cardiovascular: no CP/SOB/palpitations/+ leg swelling Respiratory: no cough/SOB Gastrointestinal: no N/V/D/C/+heartburn Musculoskeletal: no muscle/joint aches Skin: no rashes, + hair loss Neurological: no tremors/numbness/tingling/dizziness, + HA  I reviewed pt's medications, allergies, PMH, social hx, family hx, and changes were documented in  the history of present illness. Otherwise, unchanged from my initial visit note.  PE: BP 114/80 mmHg  Pulse 87  Temp(Src) 98.2 F (36.8 C) (Oral)  Resp 12  Wt 216 lb 9.6 oz (98.249 kg)  SpO2 96% Body mass index is 35.48 kg/(m^2). Wt Readings from Last 3 Encounters:  08/14/15 216 lb 9.6 oz (98.249 kg)  01/03/15 215 lb (97.523 kg)  11/14/14 214 lb 6.4 oz (97.251 kg)   Constitutional: overweight, in NAD Eyes: PERRLA, EOMI, no exophthalmos ENT: moist mucous membranes, no thyromegaly, no cervical lymphadenopathy Cardiovascular: RRR, No MRG Respiratory: CTA B Gastrointestinal: abdomen soft, NT, ND, BS+ Musculoskeletal: no deformities, strength intact  in all 4 Skin: moist, warm, no rashes.  Neurological: no tremor with outstretched hands, DTR normal in all 4  ASSESSMENT: 1. DM2, insulin-dependent, controlled, without complications  2. HTG  PLAN:  1.  Patient with improving diabetes at last visit, returning after a long absence. Her sugars were doing great on Metformin + basal-bolus insulin regimen in 12/2014 >> we did not change her regimen. At this visit, she is doing well but she is asking about Januvia >> advised that Januvia would not be my first choice because she is already on mealtime insulin and also because of the risk of pancreatitis which is already high due to her elevated triglycerides (although they improved in the last year). However, we cannot and SGLT2 inhibitor and back off Lantus and NovoLog doses. - I advised her to:  Patient Instructions  Please decrease: - Metformin 1000 mg 2x a day - Lantus 35 >> 30 units at night - NovoLog 10-16 units before a meal  Please start Farxiga 10 mg in am.   Please return in 3 months with your sugar log.   - we discussed about SEs of Farxiga, which are: dizziness (advised to be careful when stands from sitting position), decreased BP - usually not < normal (BP today is not low), and fungal UTIs (advised to let me know if develops one).  - Needs a new eye exam - check HbA1c today >> 5.5% (great!) - RTC in 3 mo  2. Hypertriglyceridemia - on Lopid 600 mg twice a day, and  omega-3 fatty acids 1g 2x a day - TG much improved per last check

## 2015-09-17 ENCOUNTER — Other Ambulatory Visit: Payer: Self-pay | Admitting: *Deleted

## 2015-09-17 MED ORDER — INSULIN ASPART 100 UNIT/ML FLEXPEN: PEN_INJECTOR | SUBCUTANEOUS | Status: DC

## 2015-10-03 ENCOUNTER — Other Ambulatory Visit: Payer: Self-pay | Admitting: Internal Medicine

## 2015-10-28 ENCOUNTER — Encounter: Payer: Self-pay | Admitting: Physician Assistant

## 2015-10-28 ENCOUNTER — Ambulatory Visit (INDEPENDENT_AMBULATORY_CARE_PROVIDER_SITE_OTHER): Payer: BLUE CROSS/BLUE SHIELD | Admitting: Physician Assistant

## 2015-10-28 VITALS — BP 113/72 | HR 78 | Temp 98.3°F | Resp 16 | Ht 66.0 in | Wt 214.6 lb

## 2015-10-28 DIAGNOSIS — E781 Pure hyperglyceridemia: Secondary | ICD-10-CM

## 2015-10-28 DIAGNOSIS — I10 Essential (primary) hypertension: Secondary | ICD-10-CM | POA: Diagnosis not present

## 2015-10-28 DIAGNOSIS — Z794 Long term (current) use of insulin: Secondary | ICD-10-CM

## 2015-10-28 DIAGNOSIS — E1165 Type 2 diabetes mellitus with hyperglycemia: Secondary | ICD-10-CM

## 2015-10-28 LAB — CBC WITH DIFFERENTIAL/PLATELET
BASOS ABS: 0 {cells}/uL (ref 0–200)
Basophils Relative: 0 %
EOS PCT: 2 %
Eosinophils Absolute: 176 cells/uL (ref 15–500)
HCT: 35.1 % (ref 35.0–45.0)
Hemoglobin: 11.7 g/dL (ref 11.7–15.5)
LYMPHS ABS: 3520 {cells}/uL (ref 850–3900)
LYMPHS PCT: 40 %
MCH: 27.6 pg (ref 27.0–33.0)
MCHC: 33.3 g/dL (ref 32.0–36.0)
MCV: 82.8 fL (ref 80.0–100.0)
MONOS PCT: 4 %
MPV: 10.2 fL (ref 7.5–12.5)
Monocytes Absolute: 352 cells/uL (ref 200–950)
NEUTROS PCT: 54 %
Neutro Abs: 4752 cells/uL (ref 1500–7800)
PLATELETS: 468 10*3/uL — AB (ref 140–400)
RBC: 4.24 MIL/uL (ref 3.80–5.10)
RDW: 14.9 % (ref 11.0–15.0)
WBC: 8.8 10*3/uL (ref 3.8–10.8)

## 2015-10-28 MED ORDER — LOSARTAN POTASSIUM 50 MG PO TABS: 50.0000 mg | ORAL_TABLET | Freq: Every day | ORAL | Status: DC

## 2015-10-28 MED ORDER — GEMFIBROZIL 600 MG PO TABS: 600.0000 mg | ORAL_TABLET | Freq: Two times a day (BID) | ORAL | Status: DC

## 2015-10-28 NOTE — Progress Notes (Signed)
Patient ID: Carolyn Butler, female    DOB: 05/20/1971, 45 y.o.   MRN: [id number]  PCP: Dow AdolphMCPHERSON,BARBARA, MD  Subjective:   Chief Complaint  Patient presents with  . Follow-up  . Hyperlipidemia  . Hypertension  . Medication Refill    HPI Presents for evaluation of HTN and hyperlipidemia.  Previously followed by Dr. Audria NineMcPherson, who has retired. Diabetes care with endocrinology (Dr. Elvera LennoxGherghe). A1C 07/2015 5.6%. Next visit in 11/2015. Last eye exam with London SheerPeter Dunn, OD about 1 year ago, no retinopathy. Last tetanus booster >10 years ago. Declines today.  Feels good.  No CP, SOB, HA, Dizziness, GI/GU symptoms.    Review of Systems As above.    Patient Active Problem List   Diagnosis Date Noted  . Controlled diabetes mellitus with long-term current use of insulin (HCC) 08/14/2015  . Pseudohyponatremia 12/14/2012  . Hypertriglyceridemia 12/14/2012  . Benign essential HTN 11/20/2011  . Obesity, Class II, BMI 35-39.9, with comorbidity (HCC) 11/20/2011  . Sleep disturbance 11/20/2011     Prior to Admission medications   Medication Sig Start Date End Date Taking? Authorizing Provider  aspirin 81 MG tablet Take 81 mg by mouth daily.   Yes Historical Provider, MD  cyclobenzaprine (FLEXERIL) 10 MG tablet TAKE ONE TABLET BY MOUTH AT BEDTIME AS NEEDED FOR MUSCLE SPASM 08/01/14  Yes Maurice MarchBarbara B McPherson, MD  gemfibrozil (LOPID) 600 MG tablet TAKE ONE TABLET BY MOUTH TWICE DAILY WITH MEALS 10/06/15  Yes Reather LittlerAjay Kumar, MD  glucose blood (ONE TOUCH ULTRA TEST) test strip Use to test blood sugar 3 times daily as instructed. 11/26/13  Yes Carlus Pavlovristina Gherghe, MD  insulin aspart (NOVOLOG FLEXPEN) 100 UNIT/ML FlexPen Inject 13-16 units three times daily. 09/17/15  Yes Carlus Pavlovristina Gherghe, MD  Insulin Glargine (LANTUS SOLOSTAR) 100 UNIT/ML Solostar Pen INJECT 30 UNITS INTO THE SKIN EACH NIGHT AT BEDTIME 08/14/15  Yes Carlus Pavlovristina Gherghe, MD  Insulin Pen Needle 32G X 4 MM MISC Use to inject insulin 4 times  daily. 07/07/15  Yes Carlus Pavlovristina Gherghe, MD  losartan (COZAAR) 50 MG tablet TAKE ONE TABLET BY MOUTH ONCE DAILY 10/06/15  Yes Reather LittlerAjay Kumar, MD  metFORMIN (GLUCOPHAGE) 1000 MG tablet Take 1 tablet (1,000 mg total) by mouth 2 (two) times daily. 08/14/15  Yes Carlus Pavlovristina Gherghe, MD  Multiple Vitamin (MULTIVITAMIN) tablet Take 1 tablet by mouth daily.   Yes Historical Provider, MD  Omega-3 Fatty Acids (OMEGA 3 PO) Take by mouth 2 (two) times daily with a meal.   Yes Historical Provider, MD  VITAMIN E PO Take by mouth daily.   Yes Historical Provider, MD     Allergies  Allergen Reactions  . Doxycycline        Objective:  Physical Exam  Constitutional: She is oriented to person, place, and time. She appears well-developed and well-nourished. No distress.  BP 113/72 mmHg  Pulse 78  Temp(Src) 98.3 F (36.8 C) (Oral)  Resp 16  Ht 5\' 6"  (1.676 m)  Wt 214 lb 9.6 oz (97.342 kg)  BMI 34.65 kg/m2  SpO2 98%  LMP 10/16/2015   Eyes: Conjunctivae are normal. No scleral icterus.  Neck: No thyromegaly present.  Cardiovascular: Normal rate, regular rhythm, normal heart sounds and intact distal pulses.   Pulmonary/Chest: Effort normal and breath sounds normal.  Lymphadenopathy:    She has no cervical adenopathy.  Neurological: She is alert and oriented to person, place, and time. She has normal strength. No sensory deficit.  Skin: Skin is warm and dry.  Psychiatric: She  has a normal mood and affect. Her speech is normal and behavior is normal.           Assessment & Plan:   1. Benign essential HTN Controlled. Continue current treatment. - Comprehensive metabolic panel - CBC with Differential/Platelet  2. Hypertriglyceridemia Await lab results. - Comprehensive metabolic panel - Lipid panel  3. Controlled type 2 diabetes mellitus with hyperglycemia, with long-term current use of insulin (HCC) Managed by endocrinology, but needed updated foot exam and urine microalbumin today. - HM Diabetes  Foot Exam - Comprehensive metabolic panel - Microalbumin, urine    Return in about 6 months (around 04/28/2016).    Fernande Bras, PA-C Physician Assistant-Certified [facility]

## 2015-10-28 NOTE — Patient Instructions (Signed)
     IF you received an x-ray today, you will receive an invoice from [facility]. Please contact [facility] at [phone] with questions or concerns regarding your invoice.   IF you received labwork today, you will receive an invoice from [facility]. Please contact [facility] at [phone] with questions or concerns regarding your invoice.   Our billing staff will not be able to assist you with questions regarding bills from these companies.  You will be contacted with the lab results as soon as they are available. The fastest way to get your results is to activate your My Chart account. Instructions are located on the last page of this paperwork. If you have not heard from us regarding the results in 2 weeks, please contact this office.      

## 2015-10-29 LAB — MICROALBUMIN, URINE: Microalb, Ur: 2.4 mg/dL

## 2015-10-29 LAB — COMPREHENSIVE METABOLIC PANEL
ALT: 22 U/L (ref 6–29)
AST: 17 U/L (ref 10–35)
Albumin: 4.6 g/dL (ref 3.6–5.1)
Alkaline Phosphatase: 25 U/L — ABNORMAL LOW (ref 33–115)
BILIRUBIN TOTAL: 0.4 mg/dL (ref 0.2–1.2)
BUN: 9 mg/dL (ref 7–25)
CALCIUM: 9.4 mg/dL (ref 8.6–10.2)
CHLORIDE: 98 mmol/L (ref 98–110)
CO2: 24 mmol/L (ref 20–31)
Creat: 0.41 mg/dL — ABNORMAL LOW (ref 0.50–1.10)
GLUCOSE: 106 mg/dL — AB (ref 65–99)
Potassium: 4.5 mmol/L (ref 3.5–5.3)
Sodium: 138 mmol/L (ref 135–146)
Total Protein: 7.3 g/dL (ref 6.1–8.1)

## 2015-10-29 LAB — LIPID PANEL
CHOL/HDL RATIO: 6.2 ratio — AB (ref <=5.0)
Cholesterol: 155 mg/dL (ref 125–200)
HDL: 25 mg/dL — AB (ref >=46)
LDL CALC: 99 mg/dL (ref <130)
TRIGLYCERIDES: 153 mg/dL — AB (ref <150)
VLDL: 31 mg/dL — AB (ref <30)

## 2015-10-31 ENCOUNTER — Other Ambulatory Visit: Payer: Self-pay | Admitting: Internal Medicine

## 2015-11-13 ENCOUNTER — Ambulatory Visit: Payer: BLUE CROSS/BLUE SHIELD | Admitting: Internal Medicine

## 2016-01-06 ENCOUNTER — Other Ambulatory Visit: Payer: Self-pay | Admitting: Internal Medicine

## 2016-03-05 ENCOUNTER — Other Ambulatory Visit: Payer: Self-pay | Admitting: Family Medicine

## 2016-03-05 NOTE — Telephone Encounter (Signed)
Appears to be a Dr Elvera LennoxGherghe patient please forward

## 2016-03-05 NOTE — Telephone Encounter (Signed)
Forward request to patients Endo.

## 2016-03-15 ENCOUNTER — Other Ambulatory Visit: Payer: Self-pay | Admitting: Internal Medicine

## 2016-04-02 ENCOUNTER — Encounter: Payer: Self-pay | Admitting: Internal Medicine

## 2016-04-02 ENCOUNTER — Ambulatory Visit (INDEPENDENT_AMBULATORY_CARE_PROVIDER_SITE_OTHER): Payer: BLUE CROSS/BLUE SHIELD | Admitting: Internal Medicine

## 2016-04-02 VITALS — BP 124/82 | HR 88 | Ht 65.5 in | Wt 204.0 lb

## 2016-04-02 DIAGNOSIS — Z794 Long term (current) use of insulin: Secondary | ICD-10-CM

## 2016-04-02 DIAGNOSIS — E781 Pure hyperglyceridemia: Secondary | ICD-10-CM | POA: Diagnosis not present

## 2016-04-02 DIAGNOSIS — E1165 Type 2 diabetes mellitus with hyperglycemia: Secondary | ICD-10-CM | POA: Diagnosis not present

## 2016-04-02 LAB — POCT GLYCOSYLATED HEMOGLOBIN (HGB A1C): HEMOGLOBIN A1C: 5.9

## 2016-04-02 MED ORDER — INSULIN ASPART 100 UNIT/ML FLEXPEN
PEN_INJECTOR | SUBCUTANEOUS | 5 refills | Status: DC
Start: 2016-04-02 — End: 2017-02-16

## 2016-04-02 MED ORDER — INSULIN PEN NEEDLE 32G X 4 MM MISC: 3 refills | Status: DC

## 2016-04-02 MED ORDER — INSULIN GLARGINE 100 UNIT/ML SOLOSTAR PEN: PEN_INJECTOR | SUBCUTANEOUS | 5 refills | Status: DC

## 2016-04-02 NOTE — Progress Notes (Signed)
Patient ID: Carolyn Butler, female   DOB: Mar 09, 1971, 45 y.o.   MRN: [id number]  HPI: Carolyn BaltimoreLina G Butler is a 45 y.o.-year-old female, returning for f/u for DM2, dx 2005, insulin-dependent since 01/2013, controlled, without complications. Last visit was 8 mo ago.   Last hemoglobin A1c was: Lab Results  Component Value Date   HGBA1C 5.6 08/14/2015   HGBA1C 5.7 01/03/2015   HGBA1C 5.9 (H) 09/25/2014  Previous A1c was 8.4% in 06/2012.  She has a h/o med noncompliance per PCPs notes.    Pt was on a regimen of: - Metformin 1000 mg po bid - Lantus 30 units at night - NovoLog:  - 13 units in am - no NovoLog for lunch (lighter lunch) - 16 units with dinner Pt ws on Amaryl in the past and gained 15 lbs >> stopped it and lost the weight We stopped Januvia 100 mg in 01/2013 b/c increased TG and high risk of Pancreatitis.   Now on: - Metformin 1000 mg 2x a day - Lantus 30 units at night - NovoLog 10-16 units before a meal  Pt checks her sugars 1x a day: - am: 275-330 >> 264-301 >> 130-170 (187) >> 139-170 >> 123-155 >> 104-120s >> 110-131 >> 120-124, 140 - 2h after breakfast: n/c >> 206, 240 >> n/c - before lunch: 250s >> n/c >> 145-190 >> 148-185 >> 77 x1 >> 120s >> 88, 99 >> n/c - 2h after lunch: 282 >> 260, 154, 294 >> 147-199 >> 120-136 >> 120-150, 163 >> 100-128 >> 140 - before dinner:321, 388 (in 01/2014) >> n/c >> 135-206 >> 114-164 >> n/c - bedtime: 198 >> 158-259 >> 200 x1 >> 160s, 179 >> 104, 141 >> 140s No lows. Lowest sugar was 87 >> 104 >> 100; she has hypoglycemia awareness at 80.  Highest sugar was 240 >> 200 >> 179 >> 140s.  Meals >> will start Doylene BodeJenny Craig: - b'fast: 1 egg + toast - lunch: salad + meat or fish - dinner: spaghetti, adds veggies or salads but not always Smaller portions.   Pt does not have chronic kidney disease, last BUN/creatinine was:  Lab Results  Component Value Date   BUN 9 10/28/2015   CREATININE 0.41 (L) 10/28/2015  On Losartan.  Last set of  lipids: Lab Results  Component Value Date   CHOL 155 10/28/2015   HDL 25 (L) 10/28/2015   LDLCALC 99 10/28/2015   LDLDIRECT 86.8 02/16/2013   TRIG 153 (H) 10/28/2015   CHOLHDL 6.2 (H) 10/28/2015   Previously: Lab Results  Component Value Date   CHOL 170 05/09/2013   HDL 28* 05/09/2013   LDLCALC Comment:   Not calculated due to Triglyceride >400.  05/09/2013   LDLDIRECT 86.8 02/16/2013   TRIG 790* 05/09/2013   CHOLHDL 6.1 05/09/2013  On omega 3 FA - 1 g 2x /day , Gemfibrozil 600 mg bid >> TG decreased from 4500 to 790 to 170!  Pt's last eye exam was in 03/2014. No DR.  Denies numbness and tingling in her legs.   I reviewed her chart and she also has a history of HTN, HL, obesity class 2.   ROS: Constitutional: + weight loss, + increased appetite, no fatigue Eyes: no blurry vision, no xerophthalmia ENT: no sore throat, no nodules palpated in throat, no dysphagia/no odynophagia, no hoarseness Cardiovascular: no CP/SOB/palpitations/+ leg swelling Respiratory: no cough/SOB Gastrointestinal: no N/V/D/C/heartburn Musculoskeletal: no muscle/joint aches Skin: no rashes, no hair loss Neurological: no tremors/numbness/tingling/dizziness, + HA  I  reviewed pt's medications, allergies, PMH, social hx, family hx, and changes were documented in the history of present illness. Otherwise, unchanged from my initial visit note.  PE: BP 124/82 (BP Location: Left Arm, Patient Position: Sitting)   Pulse 88   Ht 5' 5.5" (1.664 m)   Wt 204 lb (92.5 kg)   SpO2 98%   BMI 33.43 kg/m  Body mass index is 33.43 kg/m. Wt Readings from Last 3 Encounters:  04/02/16 204 lb (92.5 kg)  10/28/15 214 lb 9.6 oz (97.3 kg)  08/14/15 216 lb 9.6 oz (98.2 kg)   Constitutional: overweight, in NAD Eyes: PERRLA, EOMI, no exophthalmos ENT: moist mucous membranes, no thyromegaly, no cervical lymphadenopathy Cardiovascular: RRR, No MRG Respiratory: CTA B Gastrointestinal: abdomen soft, NT, ND,  BS+ Musculoskeletal: no deformities, strength intact in all 4 Skin: moist, warm, no rashes.  Neurological: no tremor with outstretched hands, DTR normal in all 4  ASSESSMENT: 1. DM2, insulin-dependent, controlled, without complications  2. HTG  PLAN:  1.  Patient with good diabetes control. Sugars are a little higher as she lost weight and reduced the insulin, however, still at goal.  - I advised her to:  Patient Instructions   Please continue: - Metformin 1000 mg po bid - Lantus 30 units at night - NovoLog:  - 13 units in am - no NovoLog for lunch - 16 units with dinner  Please return in 3-4 months with your sugar log.   - Needs a new eye exam >> advised to schedule - will refill meds - check HbA1c today >> 5.9% (great!) - RTC in 3-4 mo  2. Hypertriglyceridemia - on Lopid 600 mg twice a day, and  omega-3 fatty acids 1g 2x a day - TG much improved per last check in 10/2015  Carolyn Pavlov, MD PhD [facility]

## 2016-04-02 NOTE — Patient Instructions (Addendum)
  Please continue: - Metformin 1000 mg po bid - Lantus 30 units at night - NovoLog:  - 13 units in am - no NovoLog for lunch - 16 units with dinner  Please return in 3-4 months with your sugar log.

## 2016-04-02 NOTE — Addendum Note (Signed)
Addended by: Darene LamerHOMPSON, Raider Valbuena T on: 04/02/2016 11:42 AM   Modules accepted: Orders

## 2016-04-06 ENCOUNTER — Other Ambulatory Visit: Payer: Self-pay | Admitting: Internal Medicine

## 2016-05-03 ENCOUNTER — Other Ambulatory Visit: Payer: Self-pay | Admitting: Internal Medicine

## 2016-05-04 ENCOUNTER — Ambulatory Visit: Payer: BLUE CROSS/BLUE SHIELD | Admitting: Physician Assistant

## 2016-05-06 ENCOUNTER — Telehealth: Payer: Self-pay | Admitting: Internal Medicine

## 2016-05-06 ENCOUNTER — Other Ambulatory Visit: Payer: Self-pay

## 2016-05-06 MED ORDER — METFORMIN HCL 1000 MG PO TABS
1000.0000 mg | ORAL_TABLET | Freq: Two times a day (BID) | ORAL | 0 refills | Status: DC
Start: 2016-05-06 — End: 2016-06-04

## 2016-05-06 NOTE — Telephone Encounter (Signed)
Pt called and needs [facility] to [facility] [address] [place]

## 2016-06-04 ENCOUNTER — Other Ambulatory Visit: Payer: Self-pay | Admitting: Internal Medicine

## 2016-07-06 ENCOUNTER — Other Ambulatory Visit: Payer: Self-pay | Admitting: Internal Medicine

## 2016-08-04 ENCOUNTER — Other Ambulatory Visit: Payer: Self-pay | Admitting: Internal Medicine

## 2016-08-04 ENCOUNTER — Ambulatory Visit: Payer: BLUE CROSS/BLUE SHIELD | Admitting: Endocrinology

## 2016-08-04 ENCOUNTER — Ambulatory Visit: Payer: BLUE CROSS/BLUE SHIELD | Admitting: Internal Medicine

## 2016-08-05 ENCOUNTER — Ambulatory Visit (INDEPENDENT_AMBULATORY_CARE_PROVIDER_SITE_OTHER): Payer: BLUE CROSS/BLUE SHIELD | Admitting: Internal Medicine

## 2016-08-05 ENCOUNTER — Encounter: Payer: Self-pay | Admitting: Internal Medicine

## 2016-08-05 ENCOUNTER — Ambulatory Visit: Payer: BLUE CROSS/BLUE SHIELD | Admitting: Internal Medicine

## 2016-08-05 VITALS — BP 128/82 | HR 86 | Ht 65.0 in | Wt 215.0 lb

## 2016-08-05 DIAGNOSIS — E1165 Type 2 diabetes mellitus with hyperglycemia: Secondary | ICD-10-CM | POA: Diagnosis not present

## 2016-08-05 DIAGNOSIS — Z794 Long term (current) use of insulin: Secondary | ICD-10-CM

## 2016-08-05 DIAGNOSIS — E781 Pure hyperglyceridemia: Secondary | ICD-10-CM | POA: Diagnosis not present

## 2016-08-05 LAB — POCT GLYCOSYLATED HEMOGLOBIN (HGB A1C): Hemoglobin A1C: 6.2

## 2016-08-05 MED ORDER — METFORMIN HCL 1000 MG PO TABS: 1000.0000 mg | ORAL_TABLET | Freq: Two times a day (BID) | ORAL | 3 refills | Status: DC

## 2016-08-05 MED ORDER — INSULIN GLARGINE 100 UNIT/ML SOLOSTAR PEN: PEN_INJECTOR | SUBCUTANEOUS | 5 refills | Status: DC

## 2016-08-05 NOTE — Addendum Note (Signed)
Addended by: Darene LamerHOMPSON, Kaelea Gathright T on: 08/05/2016 03:40 PM   Modules accepted: Orders

## 2016-08-05 NOTE — Patient Instructions (Addendum)
Please continue: - Metformin 1000 mg 2x a day - Lantus 30-35 units at night - NovoLog 10-16 units before meals.  Please return in 3-4 months with your sugar log.

## 2016-08-05 NOTE — Progress Notes (Signed)
Patient ID: Carolyn Butler, female   DOB: [birth year], 46 y.o.   MRN: [id number]  HPI: Carolyn Butler is a 46 y.o.-year-old female, returning for f/u for DM2, dx 2005, insulin-dependent since 01/2013, controlled, without complications. Last visit was 8 mo ago.   Last hemoglobin A1c was: Lab Results  Component Value Date   HGBA1C 5.9 04/02/2016   HGBA1C 5.6 08/14/2015   HGBA1C 5.7 01/03/2015  Previous A1c was 8.4% in 06/2012.  She has a h/o med noncompliance per PCPs notes.    Pt was on a regimen of: - Metformin 1000 mg po bid - Lantus 30 units at night - NovoLog:  - 13 units in am - no NovoLog for lunch (lighter lunch) - 16 units with dinner Pt ws on Amaryl in the past and gained 15 lbs >> stopped it and lost the weight We stopped Januvia 100 mg in 01/2013 b/c increased TG and high risk of Pancreatitis.   Now on: - Metformin 1000 mg 2x a day - Lantus 30-35 units at night - NovoLog 10-16 units before a meal (reintroduced the lunchtime insulin)  Pt checks her sugars 1x a day: - am: 264-301 >> 130-170 (187) >> 139-170 >> 123-155 >> 104-120s >> 110-131 >> 120-124, 140 >> 120-130s - 2h after breakfast: n/c >> 206, 240 >> n/c - before lunch: 250s >> n/c >> 145-190 >> 148-185 >> 77 x1 >> 120s >> 88, 99 >> n/c - 2h after lunch: 120-136 >> 120-150, 163 >> 100-128 >> 140 >> 200s (before starting lunchtime NovoLog), 150s with Novolog - before dinner:321, 388 (in 01/2014) >> n/c >> 135-206 >> 114-164 >> n/c - bedtime: 198 >> 158-259 >> 200 x1 >> 160s, 179 >> 104, 141 >> 140s >> n/c No lows. Lowest sugar was 87 >> 104 >> 100 >> 109; she has hypoglycemia awareness at 80.  Highest sugar was 240 >> 200 >> 179 >> 140s >> 200s.  Meals: - b'fast: 1 egg + toast - lunch: salad + meat or fish - dinner: spaghetti, adds veggies or salads but not always Smaller portions.   Pt does not have chronic kidney disease, last BUN/creatinine was:  Lab Results  Component Value Date   BUN 9 10/28/2015    CREATININE 0.41 (L) 10/28/2015  On Losartan.  Last set of lipids: Lab Results  Component Value Date   CHOL 155 10/28/2015   HDL 25 (L) 10/28/2015   LDLCALC 99 10/28/2015   LDLDIRECT 86.8 02/16/2013   TRIG 153 (H) 10/28/2015   CHOLHDL 6.2 (H) 10/28/2015   Previously: Lab Results  Component Value Date   CHOL 155 10/28/2015   HDL 25 (L) 10/28/2015   LDLCALC 99 10/28/2015   LDLDIRECT 86.8 02/16/2013   TRIG 153 (H) 10/28/2015   CHOLHDL 6.2 (H) 10/28/2015   Prev: Lab Results  Component Value Date   CHOL 170 05/09/2013   HDL 28* 05/09/2013   LDLCALC Comment:   Not calculated due to Triglyceride >400.  05/09/2013   LDLDIRECT 86.8 02/16/2013   TRIG 790* 05/09/2013   CHOLHDL 6.1 05/09/2013  On omega 3 FA - 1 g 2x /day , Gemfibrozil 600 mg bid >> TG decreased from 4500 to 790 to 170 >> 153!  Pt's last eye exam was in 03/2014 (!). No DR.  Denies numbness and tingling in her legs.   I reviewed her chart and she also has a history of HTN, HL, obesity class 2.   ROS: Constitutional: + weight gain, no fatigue Eyes:  no blurry vision, no xerophthalmia ENT: no sore throat, no nodules palpated in throat, no dysphagia/no odynophagia, no hoarseness Cardiovascular: no CP/SOB/palpitations/ leg swelling Respiratory: no cough/SOB Gastrointestinal: no N/V/D/C/heartburn Musculoskeletal: no muscle/joint aches Skin: no rashes, no hair loss Neurological: no tremors/numbness/tingling/dizziness, + HA  I reviewed pt's medications, allergies, PMH, social hx, family hx, and changes were documented in the history of present illness. Otherwise, unchanged from my initial visit note.  PE: BP 128/82 (BP Location: Left Arm, Patient Position: Sitting)   Pulse 86   Ht 5\' 5"  (1.651 m)   Wt 215 lb (97.5 kg)   SpO2 96%   BMI 35.78 kg/m  Body mass index is 35.78 kg/m. Wt Readings from Last 3 Encounters:  08/05/16 215 lb (97.5 kg)  04/02/16 204 lb (92.5 kg)  10/28/15 214 lb 9.6 oz (97.3 kg)    Constitutional: overweight, in NAD Eyes: PERRLA, EOMI, no exophthalmos ENT: moist mucous membranes, no thyromegaly, no cervical lymphadenopathy Cardiovascular: RRR, No MRG Respiratory: CTA B Gastrointestinal: abdomen soft, NT, ND, BS+ Musculoskeletal: no deformities, strength intact in all 4 Skin: moist, warm, no rashes.  Neurological: no tremor with outstretched hands, DTR normal in all 4  ASSESSMENT: 1. DM2, insulin-dependent, controlled, without complications  2. HTG  PLAN:  1.  Patient with good diabetes control. Sugars are slightly higher over the Holidays, but she already started to change her diet and started exercise (stationary bicycle). Will not change regimen, will continue the lunchtime Novolog also, which appears to make a large difference in her evening sugars. - I advised her to:  Patient Instructions  Please continue: - Metformin 1000 mg 2x a day - Lantus 30-35 units at night - NovoLog 10-16 units before meals.  Please return in 3-4 months with your sugar log.  - Needs a new eye exam >> again advised to schedule! - will refill Metformin - check HbA1c today >> 6.2% (slightly higher) - RTC in 3-4 mo  2. Hypertriglyceridemia - on Lopid 600 mg twice a day, and  omega-3 fatty acids 1g 2x a day >> continue - TG much improved per last check in 10/2015  Carlus Pavlovristina Jaleeya Mcnelly, MD PhD [facility]

## 2016-11-03 ENCOUNTER — Other Ambulatory Visit: Payer: Self-pay | Admitting: Physician Assistant

## 2016-11-03 DIAGNOSIS — E781 Pure hyperglyceridemia: Secondary | ICD-10-CM

## 2016-11-03 DIAGNOSIS — I10 Essential (primary) hypertension: Secondary | ICD-10-CM

## 2016-12-02 ENCOUNTER — Encounter: Payer: Self-pay | Admitting: Internal Medicine

## 2016-12-02 ENCOUNTER — Ambulatory Visit (INDEPENDENT_AMBULATORY_CARE_PROVIDER_SITE_OTHER): Payer: BLUE CROSS/BLUE SHIELD | Admitting: Internal Medicine

## 2016-12-02 VITALS — BP 130/84 | HR 89 | Ht 66.0 in | Wt 220.0 lb

## 2016-12-02 DIAGNOSIS — Z794 Long term (current) use of insulin: Secondary | ICD-10-CM

## 2016-12-02 DIAGNOSIS — E781 Pure hyperglyceridemia: Secondary | ICD-10-CM | POA: Diagnosis not present

## 2016-12-02 DIAGNOSIS — E1165 Type 2 diabetes mellitus with hyperglycemia: Secondary | ICD-10-CM | POA: Diagnosis not present

## 2016-12-02 LAB — POCT GLYCOSYLATED HEMOGLOBIN (HGB A1C): HEMOGLOBIN A1C: 6.3

## 2016-12-02 NOTE — Patient Instructions (Addendum)
Please continue: - Metformin 1000 mg 2x a day - Lantus 30-35 units at night - NovoLog 10-16 units before meals.  Please return in 4 months with your sugar log.

## 2016-12-02 NOTE — Progress Notes (Signed)
Patient ID: Carolyn Butler, female   DOB: [birth year], 46 y.o.   MRN: [id number]  HPI: Carolyn Butler is a 46 y.o.-year-old female, returning for f/u for DM2, dx 2005, insulin-dependent since 01/2013, controlled, without complications. Last visit was 4 months ago.  She started to walk more.  Last hemoglobin A1c was: Lab Results  Component Value Date   HGBA1C 6.2 08/05/2016   HGBA1C 5.9 04/02/2016   HGBA1C 5.6 08/14/2015  Previous A1c was 8.4% in 06/2012.  She has a h/o med noncompliance per PCPs notes.    Pt was on a regimen of: - Metformin 1000 mg po bid - Lantus 30 units at night - NovoLog:  - 13 units in am - no NovoLog for lunch (lighter lunch) - 16 units with dinner Pt ws on Amaryl in the past and gained 15 lbs >> stopped it and lost the weight We stopped Januvia 100 mg in 01/2013 b/c increased TG and high risk of Pancreatitis.   Now on: - Metformin 1000 mg 2x a day - Lantus 30-35 units at night - NovoLog 10-16 units before meals.  Pt checks her sugars 3x a day (no log, no meter) - am: 104-120s >> 110-131 >> 120-124, 140 >> 120-130s >> 103, 114-134 - 2h after breakfast: n/c >> 206, 240 >> n/c - before lunch: 148-185 >> 77 x1 >> 120s >> 88, 99 >> n/c - 2h after lunch: 140 >> 200s (before starting lunchtime NovoLog), 150s with Novolog >> 170-180 - before dinner:32n/c >> 135-206 >> 114-164 >> n/c - bedtime: 158-259 >> 200 x1 >> 160s, 179 >> 104, 141 >> 140s >> n/c >> 140-180 No lows. Lowest sugar was 87 >> 104 >> 100 >> 109 >> 103; she has hypoglycemia awareness at 80.  Highest sugar was 240 >> 200 >> 179 >> 140s >> 200s >> 180.  No CKD, last BUN/creatinine was:  Lab Results  Component Value Date   BUN 9 10/28/2015   CREATININE 0.41 (L) 10/28/2015  On Losartan.  Last set of lipids: Lab Results  Component Value Date   CHOL 155 10/28/2015   HDL 25 (L) 10/28/2015   LDLCALC 99 10/28/2015   LDLDIRECT 86.8 02/16/2013   TRIG 153 (H) 10/28/2015   CHOLHDL 6.2 (H)  10/28/2015   Prev: Lab Results  Component Value Date   CHOL 170 05/09/2013   HDL 28* 05/09/2013   LDLCALC Comment:   Not calculated due to Triglyceride >400.  05/09/2013   LDLDIRECT 86.8 02/16/2013   TRIG 790* 05/09/2013   CHOLHDL 6.1 05/09/2013  On omega 3 FA - 1 g 2x /day , Gemfibrozil 600 mg bid >> TG decreased from 4500 to 790 to 170 >> 153!  Pt's last eye exam was in 03/2014 (!). No DR.  Denies numbness and tingling in her legs.   I reviewed her chart and she also has a history of HTN, HL, obesity class 2.   ROS: Constitutional: + weight gain, no fatigue, no subjective hyperthermia, no subjective hypothermia Eyes: no blurry vision, no xerophthalmia ENT: no sore throat, no nodules palpated in throat, no dysphagia, no odynophagia, no hoarseness Cardiovascular: no CP/no SOB/no palpitations/no leg swelling Respiratory: no cough/no SOB/no wheezing Gastrointestinal: no N/no V/no D/no C/no acid reflux Musculoskeletal: no muscle aches/no joint aches Skin: no rashes, no hair loss Neurological: no tremors/no numbness/no tingling/no dizziness, + HA  I reviewed pt's medications, allergies, PMH, social hx, family hx, and changes were documented in the history of present illness. Otherwise, unchanged  from my initial visit note  PE: BP 130/84 (BP Location: Left Arm, Patient Position: Sitting)   Pulse 89   Ht 5\' 6"  (1.676 m)   Wt 220 lb (99.8 kg)   SpO2 97%   BMI 35.51 kg/m  Body mass index is 35.51 kg/m. Wt Readings from Last 3 Encounters:  12/02/16 220 lb (99.8 kg)  08/05/16 215 lb (97.5 kg)  04/02/16 204 lb (92.5 kg)   Constitutional: overweight, in NAD Eyes: PERRLA, EOMI, no exophthalmos ENT: moist mucous membranes, no thyromegaly, no cervical lymphadenopathy Cardiovascular: RRR, No MRG Respiratory: CTA B Gastrointestinal: abdomen soft, NT, ND, BS+ Musculoskeletal: no deformities, strength intact in all 4 Skin: moist, warm, no rashes Neurological: no tremor with  outstretched hands, DTR normal in all 4  ASSESSMENT: 1. DM2, insulin-dependent, controlled, without complications  2. HTG  PLAN:  1.  Patient with good diabetes control. Sugars are slightly higher and she also gained weight. She started to walk and also wants to start exercising more intensely and improve her diet,. Therefore, will not change regimen for now. - I advised her to:  Please continue: - Metformin 1000 mg 2x a day - Lantus 30-35 units at night - NovoLog 10-16 units before meals.  Please return in 4 months with your sugar log.  - today, HbA1c is 6.3% (stable) - continue checking sugars at different times of the day - check 1x a day, rotating checks - advised for yearly eye exams >> she did not schedule this despite multiple promptings at every visit - Return to clinic in 4 mo with sugar log   2. Hypertriglyceridemia - Improved her last check in 10/2015 - Continues on Lopid 600 mg twice a day and omega-3 fatty acids 1 g twice a day - will have APE soon  Carolyn Pavlovristina Teriann Livingood, MD PhD [facility]

## 2016-12-02 NOTE — Addendum Note (Signed)
Addended by: Darene LamerHOMPSON, Jeneva Schweizer T on: 12/02/2016 11:36 AM   Modules accepted: Orders

## 2017-01-28 ENCOUNTER — Other Ambulatory Visit: Payer: Self-pay | Admitting: Physician Assistant

## 2017-01-28 ENCOUNTER — Telehealth: Payer: Self-pay

## 2017-01-28 ENCOUNTER — Other Ambulatory Visit: Payer: Self-pay | Admitting: Internal Medicine

## 2017-01-28 DIAGNOSIS — I10 Essential (primary) hypertension: Secondary | ICD-10-CM

## 2017-01-28 DIAGNOSIS — E781 Pure hyperglyceridemia: Secondary | ICD-10-CM

## 2017-01-28 NOTE — Telephone Encounter (Signed)
myhcart message sent to pt about making an appt

## 2017-01-28 NOTE — Telephone Encounter (Signed)
I contacted the patient and advised the Lantus has been submitted for refill but the other two prescriptions will need to come from PCP.  

## 2017-01-28 NOTE — Telephone Encounter (Signed)
**  Remind patient they can make refill requests via MyChart**  Medication refill request (Name & Dosage): Insulin Glargine (LANTUS SOLOSTAR) 100 UNIT/ML Solostar Pen losartan (COZAAR) 50 MG tablet gemfibrozil (LOPID) 600 MG tablet  Preferred pharmacy (Name & Address):  [facility] [address] - [place], [place] - [address] [phone] (Phone) [phone] (Fax)   Other comments (if applicable):  Notify patient once rx has been placed.

## 2017-01-28 NOTE — Telephone Encounter (Signed)
I contacted the patient and advised the Lantus has been submitted for refill but the other two prescriptions will need to come from PCP.

## 2017-02-16 ENCOUNTER — Other Ambulatory Visit: Payer: Self-pay | Admitting: Internal Medicine

## 2017-02-17 ENCOUNTER — Telehealth: Payer: Self-pay | Admitting: Internal Medicine

## 2017-02-17 NOTE — Telephone Encounter (Signed)
Patient called in reference to Rx for  Insulin Pen Needle 32G X 4 MM MISC. Patient stated she receives 100 needles with the Rx and needs 150 needles.   Please call patient and advise when filled. OK to leave message.

## 2017-02-18 ENCOUNTER — Other Ambulatory Visit: Payer: Self-pay

## 2017-02-18 MED ORDER — INSULIN PEN NEEDLE 32G X 4 MM MISC: 5 refills | Status: DC

## 2017-02-18 NOTE — Telephone Encounter (Signed)
Submitted and called patient.

## 2017-02-28 ENCOUNTER — Other Ambulatory Visit: Payer: Self-pay | Admitting: Physician Assistant

## 2017-02-28 DIAGNOSIS — I10 Essential (primary) hypertension: Secondary | ICD-10-CM

## 2017-02-28 DIAGNOSIS — E781 Pure hyperglyceridemia: Secondary | ICD-10-CM

## 2017-03-02 NOTE — Telephone Encounter (Signed)
MyChart message sent to pt about making an appt for more refills °

## 2017-03-12 ENCOUNTER — Ambulatory Visit (INDEPENDENT_AMBULATORY_CARE_PROVIDER_SITE_OTHER): Payer: BLUE CROSS/BLUE SHIELD | Admitting: Physician Assistant

## 2017-03-12 ENCOUNTER — Encounter: Payer: Self-pay | Admitting: Physician Assistant

## 2017-03-12 VITALS — BP 130/82 | HR 79 | Temp 97.9°F | Resp 16 | Ht 66.0 in | Wt 218.0 lb

## 2017-03-12 DIAGNOSIS — E669 Obesity, unspecified: Secondary | ICD-10-CM | POA: Diagnosis not present

## 2017-03-12 DIAGNOSIS — R5383 Other fatigue: Secondary | ICD-10-CM

## 2017-03-12 DIAGNOSIS — E559 Vitamin D deficiency, unspecified: Secondary | ICD-10-CM

## 2017-03-12 DIAGNOSIS — M62838 Other muscle spasm: Secondary | ICD-10-CM

## 2017-03-12 DIAGNOSIS — E1165 Type 2 diabetes mellitus with hyperglycemia: Secondary | ICD-10-CM

## 2017-03-12 DIAGNOSIS — Z794 Long term (current) use of insulin: Secondary | ICD-10-CM

## 2017-03-12 DIAGNOSIS — E781 Pure hyperglyceridemia: Secondary | ICD-10-CM

## 2017-03-12 DIAGNOSIS — Z23 Encounter for immunization: Secondary | ICD-10-CM

## 2017-03-12 DIAGNOSIS — I10 Essential (primary) hypertension: Secondary | ICD-10-CM

## 2017-03-12 DIAGNOSIS — IMO0001 Reserved for inherently not codable concepts without codable children: Secondary | ICD-10-CM

## 2017-03-12 DIAGNOSIS — R7989 Other specified abnormal findings of blood chemistry: Secondary | ICD-10-CM

## 2017-03-12 MED ORDER — GEMFIBROZIL 600 MG PO TABS: ORAL_TABLET | ORAL | 3 refills | Status: DC

## 2017-03-12 MED ORDER — CYCLOBENZAPRINE HCL 10 MG PO TABS: ORAL_TABLET | ORAL | 1 refills | Status: DC

## 2017-03-12 MED ORDER — LOSARTAN POTASSIUM 50 MG PO TABS: ORAL_TABLET | ORAL | 3 refills | Status: DC

## 2017-03-12 NOTE — Patient Instructions (Addendum)
[facility]  [address], [place], [place] [address]  Phone: [phone]  [facility] [address], [place], [place] [address]  Phone: [phone]  [facility] [address], [place], [place] [address]  Phone: [phone]      IF you received an x-ray today, you will receive an invoice from [facility]. Please contact [facility] at [phone] with questions or concerns regarding your invoice.   IF you received labwork today, you will receive an invoice from [facility]. Please contact [facility] at [phone] with questions or concerns regarding your invoice.   Our billing staff will not be able to assist you with questions regarding bills from these companies.  You will be contacted with the lab results as soon as they are available. The fastest way to get your results is to activate your My Chart account. Instructions are located on the last page of this paperwork. If you have not heard from us regarding the results in 2 weeks, please contact this office.      

## 2017-03-12 NOTE — Progress Notes (Signed)
Patient ID: Carolyn Butler, female    DOB: May 10, 1971, 46 y.o.   MRN: [id number]  PCP: Porfirio Oar, PA-C  Chief Complaint  Patient presents with  . Follow-up    Diabetes  . Medication Refill    Gemfibrozil, Losartan, Cyclobenzaprine    Subjective:   Presents for evaluation of diabetes, lipids, blood pressure and for medication refills. Is fasting today.  RIGHT back spasms. Uses cyclobenzaprine PRN. Tries to avoid use due to somnolence.  Diabetes is managed by Dr. Elvera Lennox. Last visit 11/2016, A1C 6.3%. Sees Dr. Elvera Lennox again next month. Last Urine Microalbumin 10/2015, at which time LDL was 99, HDL 25.  DUE FOR: Eye exam Tdap vaccine Pneumococcal vaccine  Feels fatigued, "not myself, like I'm getting dark circles under my eyes." Started back taking OTC vitamin D and getting some sun exposure. Tries to eat a varied diet with lots of leafy greens. Drinks a lot of water. Eliminated sugar from her drinks. Working to reduce carbohydrates. Very light sleeper, and her husband snores," I'm up all hours of the night." Not enough exercise recently, and has gained weight back.  Tingling in the RIGHT great and 2nd toe. Hardwood floors and sandals are worst.   Review of Systems As above. Denies chest pain, shortness of breath, HA, dizziness, vision change, nausea, vomiting, diarrhea, constipation, melena, hematochezia, dysuria, increased urinary urgency or frequency, increased hunger or thirst, unintentional weight change, unexplained myalgias or arthralgias, rash.     Patient Active Problem List   Diagnosis Date Noted  . Controlled diabetes mellitus with long-term current use of insulin (HCC) 08/14/2015  . Pseudohyponatremia 12/14/2012  . Hypertriglyceridemia 12/14/2012  . Benign essential HTN 11/20/2011  . Obesity, Class II, BMI 35-39.9, with comorbidity 11/20/2011  . Sleep disturbance 11/20/2011     Prior to Admission medications   Medication Sig Start Date  End Date Taking? Authorizing Provider  aspirin 81 MG tablet Take 81 mg by mouth daily.   Yes [provider]  cyclobenzaprine (FLEXERIL) 10 MG tablet TAKE ONE TABLET BY MOUTH AT BEDTIME AS NEEDED FOR MUSCLE SPASM 08/01/14  Yes Maurice March, MD  gemfibrozil (LOPID) 600 MG tablet TAKE 1 TABLET BY MOUTH TWICE DAILY WITH A MEAL PATIENT  NEEDS  OFFICE  VISIT  FOR  MORE  REFILLS 02/28/17  Yes Artrice Kraker, PA-C  glucose blood (ONE TOUCH ULTRA TEST) test strip Use to test blood sugar 3 times daily as instructed. 11/26/13  Yes Carlus Pavlov, MD  Insulin Glargine (LANTUS SOLOSTAR) 100 UNIT/ML Solostar Pen INJECT 30-35 UNITS INTO THE SKIN EACH NIGHT AT BEDTIME 08/05/16  Yes Carlus Pavlov, MD  Insulin Pen Needle 32G X 4 MM MISC Use to inject insulin 4 times daily. 02/18/17  Yes Carlus Pavlov, MD  LANTUS SOLOSTAR 100 UNIT/ML Solostar Pen INJECT 30 UNITS SUBCUTANEOUSLY  AT BEDTIME 01/28/17  Yes Carlus Pavlov, MD  losartan (COZAAR) 50 MG tablet TAKE 1 TABLET BY MOUTH ONCE DAILY PATIENT  NEEDS  OFFICE  VISIT  FOR  MORE  REFILLS 02/28/17  Yes Kadajah Kjos, PA-C  metFORMIN (GLUCOPHAGE) 1000 MG tablet Take 1 tablet (1,000 mg total) by mouth 2 (two) times daily. 08/05/16  Yes Carlus Pavlov, MD  Multiple Vitamin (MULTIVITAMIN) tablet Take 1 tablet by mouth daily.   Yes [provider]  NOVOLOG FLEXPEN 100 UNIT/ML FlexPen INJECT 13-16 UNITS SUBCUTANEOUSLY  THREE TIMES DAILY 02/17/17  Yes Carlus Pavlov, MD  Omega-3 Fatty Acids (OMEGA 3 PO) Take by mouth 2 (two)  times daily with a meal.   Yes [provider]  VITAMIN E PO Take by mouth daily.   Yes [provider]     Allergies  Allergen Reactions  . Doxycycline        Objective:  Physical Exam  Constitutional: She is oriented to person, place, and time. She appears well-developed and well-nourished. She is active and cooperative. No distress.  BP 130/82   Pulse 79   Temp 97.9 F (36.6 C) (Oral)    Resp 16   Ht 5\' 6"  (1.676 m)   Wt 218 lb (98.9 kg)   SpO2 99%   BMI 35.19 kg/m   HENT:  Head: Normocephalic and atraumatic.  Right Ear: Hearing normal.  Left Ear: Hearing normal.  Eyes: Conjunctivae are normal. No scleral icterus.  Neck: Normal range of motion. Neck supple. No thyromegaly present.  Cardiovascular: Normal rate, regular rhythm and normal heart sounds.   Pulses:      Radial pulses are 2+ on the right side, and 2+ on the left side.  Pulmonary/Chest: Effort normal and breath sounds normal.  Lymphadenopathy:       Head (right side): No tonsillar, no preauricular, no posterior auricular and no occipital adenopathy present.       Head (left side): No tonsillar, no preauricular, no posterior auricular and no occipital adenopathy present.    She has no cervical adenopathy.       Right: No supraclavicular adenopathy present.       Left: No supraclavicular adenopathy present.  Neurological: She is alert and oriented to person, place, and time. No sensory deficit.  Skin: Skin is warm, dry and intact. No rash noted. No cyanosis or erythema. Nails show no clubbing.  Psychiatric: She has a normal mood and affect. Her speech is normal and behavior is normal.       Wt Readings from Last 3 Encounters:  03/12/17 218 lb (98.9 kg)  12/02/16 220 lb (99.8 kg)  08/05/16 215 lb (97.5 kg)       Assessment & Plan:   Problem List Items Addressed This Visit    Hypertriglyceridemia (Chronic)    Does not need repeat labs today. Continue current treatment.      Relevant Medications   gemfibrozil (LOPID) 600 MG tablet   losartan (COZAAR) 50 MG tablet   Controlled diabetes mellitus with long-term current use of insulin (HCC) (Chronic)    Good control. Managed by endocrinology. Continue per Dr. Charlean Sanfilippo recommendations.      Relevant Medications   losartan (COZAAR) 50 MG tablet   Other Relevant Orders   HM DIABETES EYE EXAM (Completed)   Hemoglobin A1c (Completed)   Benign  essential HTN - Primary    Controlled. Continue current treatment.      Relevant Medications   gemfibrozil (LOPID) 600 MG tablet   losartan (COZAAR) 50 MG tablet   Other Relevant Orders   TSH (Completed)   CBC with Differential/Platelet (Completed)   Obesity, Class II, BMI 35-39.9, with comorbidity    Desires help in weight loss. Refer to [facility].      Relevant Orders   Amb Ref to Medical Weight Management    Other Visit Diagnoses    Need for Tdap vaccination       declined.   Need for pneumococcal vaccination       declined.   Muscle spasm       Relevant Medications   cyclobenzaprine (FLEXERIL) 10 MG tablet   Low  vitamin D level       Relevant Orders   VITAMIN D 25 Hydroxy (Vit-D Deficiency, Fractures) (Completed)   Other fatigue       check labs. Encouraged improved sleep-go to another room? ear plugs?   Relevant Orders   TSH (Completed)   CBC with Differential/Platelet (Completed)       Return in about 6 months (around 09/12/2017) for re-evaluation of blood pressure, cholesterol, etc.   Fernande Bras, PA-C [facility] at [facility]

## 2017-03-14 LAB — CBC WITH DIFFERENTIAL/PLATELET
BASOS ABS: 0 10*3/uL (ref 0.0–0.2)
Basos: 0 %
EOS (ABSOLUTE): 0.2 10*3/uL (ref 0.0–0.4)
Eos: 2 %
HEMOGLOBIN: 11.7 g/dL (ref 11.1–15.9)
Hematocrit: 34.1 % (ref 34.0–46.6)
IMMATURE GRANS (ABS): 0 10*3/uL (ref 0.0–0.1)
IMMATURE GRANULOCYTES: 0 %
LYMPHS ABS: 4.1 10*3/uL — AB (ref 0.7–3.1)
LYMPHS: 52 %
MCH: 28.4 pg (ref 26.6–33.0)
MCHC: 34.3 g/dL (ref 31.5–35.7)
MCV: 83 fL (ref 79–97)
MONOCYTES: 4 %
Monocytes Absolute: 0.4 10*3/uL (ref 0.1–0.9)
NEUTROS PCT: 42 %
Neutrophils Absolute: 3.3 10*3/uL (ref 1.4–7.0)
Platelets: 411 10*3/uL — ABNORMAL HIGH (ref 150–379)
RBC: 4.12 x10E6/uL (ref 3.77–5.28)
RDW: 14.1 % (ref 12.3–15.4)
WBC: 7.9 10*3/uL (ref 3.4–10.8)

## 2017-03-14 LAB — HEMOGLOBIN A1C
ESTIMATED AVERAGE GLUCOSE: 143 mg/dL
HEMOGLOBIN A1C: 6.6 % — AB (ref 4.8–5.6)

## 2017-03-14 LAB — TSH: TSH: 2.86 u[IU]/mL (ref 0.450–4.500)

## 2017-03-14 LAB — VITAMIN D 25 HYDROXY (VIT D DEFICIENCY, FRACTURES): VIT D 25 HYDROXY: 33.4 ng/mL (ref 30.0–100.0)

## 2017-03-30 NOTE — Assessment & Plan Note (Signed)
Good control. Managed by endocrinology. Continue per Dr. Charlean SanfilippoGherghe's recommendations.

## 2017-03-30 NOTE — Assessment & Plan Note (Signed)
Desires help in weight loss. Refer to [facility].

## 2017-03-30 NOTE — Assessment & Plan Note (Signed)
Controlled. Continue current treatment. 

## 2017-03-30 NOTE — Assessment & Plan Note (Signed)
Does not need repeat labs today. Continue current treatment.

## 2017-03-31 ENCOUNTER — Telehealth: Payer: Self-pay | Admitting: Family Medicine

## 2017-06-14 ENCOUNTER — Encounter: Payer: Self-pay | Admitting: Internal Medicine

## 2017-06-14 ENCOUNTER — Ambulatory Visit (INDEPENDENT_AMBULATORY_CARE_PROVIDER_SITE_OTHER): Payer: BLUE CROSS/BLUE SHIELD | Admitting: Internal Medicine

## 2017-06-14 VITALS — BP 122/78 | HR 88 | Wt 221.0 lb

## 2017-06-14 DIAGNOSIS — E1165 Type 2 diabetes mellitus with hyperglycemia: Secondary | ICD-10-CM

## 2017-06-14 DIAGNOSIS — E781 Pure hyperglyceridemia: Secondary | ICD-10-CM

## 2017-06-14 DIAGNOSIS — Z794 Long term (current) use of insulin: Secondary | ICD-10-CM

## 2017-06-14 LAB — POCT GLYCOSYLATED HEMOGLOBIN (HGB A1C): Hemoglobin A1C: 6.5

## 2017-06-14 NOTE — Patient Instructions (Addendum)
Please continue: - Metformin 1000 mg 2x a day - Lantus 35 units at night - NovoLog 13-16 units before b'fast and lunch  Please increase: - NovoLog 16-19 units before dinner  Please come back fasting for labs.  Please return in 4 months with your sugar log.

## 2017-06-14 NOTE — Progress Notes (Signed)
Patient ID: Carolyn Butler, female   DOB: Nov 20, 1970, 46 y.o.   MRN: [id number]  HPI: Carolyn Butler is a 46 y.o.-year-old female, returning for f/u for DM2, dx 2005, insulin-dependent since 01/2013, controlled, without complications. Last visit was 6 mo ago.  Last hemoglobin A1c was higher: Lab Results  Component Value Date   HGBA1C 6.6 (H) 03/12/2017   HGBA1C 6.3 12/02/2016   HGBA1C 6.2 08/05/2016  Previous A1c was 8.4% in 06/2012.  She has a h/o med noncompliance per PCPs notes.    She is on: - Metformin 1000 mg 2x a day - Lantus 35 units at night - NovoLog 13-16 units before meals. Pt was on Amaryl in the past and gained 15 lbs >> stopped it and lost the weight We stopped Januvia 100 mg in 01/2013 b/c increased TG and high risk of Pancreatitis.   Pt checks her sugars 3x a day: - am: 120-130s >> 103, 114-134  >> 140s, 171 - 2h after breakfast:206, 240 >> n/c - before lunch:120s >> 88, 99 >> n/c - 2h after lunch:200s (w/o NovoLog), 150s (w/ Novolog) >> 170-180 >> 163 - before dinner: 135-206 >> 114-164 >> n/c >> 115-149 - bedtime:  140s >> n/c >> 140-180 >> n/c >> 182, 189 Lowest sugar was 103 >> 115; she has hypoglycemia awareness at 80s.  Highest sugar was 180 >> 243 in 02/2017.  No CKD, last BUN/creatinine was:  Lab Results  Component Value Date   BUN 9 10/28/2015   CREATININE 0.41 (L) 10/28/2015  On Losartan.  + HL; Last set of lipids: Lab Results  Component Value Date   CHOL 155 10/28/2015   HDL 25 (L) 10/28/2015   LDLCALC 99 10/28/2015   LDLDIRECT 86.8 02/16/2013   TRIG 153 (H) 10/28/2015   CHOLHDL 6.2 (H) 10/28/2015   Prev: Lab Results  Component Value Date   CHOL 170 05/09/2013   HDL 28* 05/09/2013   LDLCALC Comment:   Not calculated due to Triglyceride >400.  05/09/2013   LDLDIRECT 86.8 02/16/2013   TRIG 790* 05/09/2013   CHOLHDL 6.1 05/09/2013  On omega 3 FA - 1000 mg 2x a day , Gemfibrozil 600 mg 2x a day >> TG decreased from 4500 to 790 to 170 >>  153.  Pt's last eye exam was in 02/2017 >> No DR. No numbness and tingling in her legs.   She also has a history of HTN, obesity class 2.  ROS: Constitutional: + weight gain/no weight loss, no fatigue, no subjective hyperthermia, no subjective hypothermia Eyes: no blurry vision, no xerophthalmia ENT: no sore throat, no nodules palpated in throat, no dysphagia, no odynophagia, no hoarseness Cardiovascular: no CP/no SOB/no palpitations/no leg swelling Respiratory: no cough/no SOB/no wheezing Gastrointestinal: no N/no V/no D/no C/no acid reflux Musculoskeletal: no muscle aches/no joint aches Skin: no rashes, no hair loss Neurological: no tremors/no numbness/no tingling/no dizziness  I reviewed pt's medications, allergies, PMH, social hx, family hx, and changes were documented in the history of present illness. Otherwise, unchanged from my initial visit note.  PE: BP 122/78 (BP Location: Left Arm, Patient Position: Sitting)   Pulse 88   Wt 221 lb (100.2 kg)   SpO2 97%   BMI 35.67 kg/m  Body mass index is 35.67 kg/m. Wt Readings from Last 3 Encounters:  06/14/17 221 lb (100.2 kg)  03/12/17 218 lb (98.9 kg)  12/02/16 220 lb (99.8 kg)   Constitutional: overweight, in NAD Eyes: PERRLA, EOMI, no exophthalmos ENT: moist mucous membranes,  no thyromegaly, no cervical lymphadenopathy Cardiovascular: RRR, No MRG Respiratory: CTA B Gastrointestinal: abdomen soft, NT, ND, BS+ Musculoskeletal: no deformities, strength intact in all 4 Skin: moist, warm, no rashes Neurological: no tremor with outstretched hands, DTR normal in all 4  ASSESSMENT: 1. DM2, insulin-dependent, controlled, without long term complications  2. HTG  PLAN:  1.  Patient with history of good diabetes control, but with worsening diabetes control in the last few months.  Her sugars are higher in the morning and they stay high throughout the day.  She is eating a high fat diet and we discussed about improving this not  only to improve her diabetes but also her triglyceride levels. - Sugars after dinner are the highest and we discussed about increasing NovoLog before dinner. - I advised her to:  Patient Instructions  Please continue: - Metformin 1000 mg 2x a day - Lantus 35 units at night - NovoLog 13-16 units before b'fast and lunch  Please increase: - NovoLog 16-19 units before dinner  Please come back fasting for labs.  Please return in 4 months with your sugar log.   - today, HbA1c is 6.5% (slightly better than at last check, but worse since last visit) - continue checking sugars at different times of the day - check 3x a day, rotating checks - advised for yearly eye exams >> she is UTD - She will come back for CMP and lipid panel, as she just had a high fat breakfast this morning - Return to clinic in 4 mo with sugar log    2. Hypertriglyceridemia - continues on Lopid 600 mg 2x day and omega-3 fatty acids 1 g 2x a day - reviewed last lipid panel >> improved TG - will check a new Lipid panel soon - strongly advised her about cutting out saturated fat from diet (b'fast this am: eggs + bacon)  Carlus Pavlovristina Sparkles Mcneely, MD PhD [facility]

## 2017-06-14 NOTE — Addendum Note (Signed)
Addended by: Darene LamerHOMPSON, Shaleen Talamantez T on: 06/14/2017 11:15 AM   Modules accepted: Orders

## 2017-08-14 ENCOUNTER — Other Ambulatory Visit: Payer: Self-pay | Admitting: Internal Medicine

## 2017-08-14 DIAGNOSIS — Z794 Long term (current) use of insulin: Principal | ICD-10-CM

## 2017-08-14 DIAGNOSIS — E1165 Type 2 diabetes mellitus with hyperglycemia: Secondary | ICD-10-CM

## 2017-09-07 ENCOUNTER — Telehealth: Payer: Self-pay | Admitting: Physician Assistant

## 2017-09-07 NOTE — Telephone Encounter (Signed)
I would not suggest to wean off, this will increase her sugars tremendously and she will need MUCH higher doses afterwards to bring them down. We can give her few samples to cover her until next appt in 1 month and few days and will discuss to use NPH and regular insulin [facility]

## 2017-09-07 NOTE — Telephone Encounter (Signed)
Please advise on below and weaning the pt off her insulin.

## 2017-09-07 NOTE — Telephone Encounter (Signed)
The patient called asking if she could whee off of her insulin. It too expensive and she is not working at the time. She would like some samples in the mean time. The pharmacy now breaks the boxes open and only give her three at a time so she has to pay $45 copay more often than once a month.

## 2017-09-08 ENCOUNTER — Other Ambulatory Visit: Payer: Self-pay

## 2017-09-08 MED ORDER — INSULIN ASPART 100 UNIT/ML FLEXPEN: PEN_INJECTOR | SUBCUTANEOUS | 5 refills | Status: DC

## 2017-09-08 MED ORDER — INSULIN GLARGINE 100 UNIT/ML SOLOSTAR PEN: PEN_INJECTOR | SUBCUTANEOUS | 5 refills | Status: DC

## 2017-09-08 NOTE — Telephone Encounter (Signed)
Ok to give her Toujeo instead and dial up to the same dose that she was using for Lantus before.  Also okay to send 40 units of NovoLog daily.

## 2017-09-08 NOTE — Telephone Encounter (Signed)
Pt stated she is out of her Lantus, we have no samples but we have Basaglar and Toujeo. She did not take her Lantus last night and her morning sugar is 134. She is still taking her mealtime insulin of Novolog 16 units with breakfast, 18-20 units for dinner, and no lunchtime. She was taking 36 units at night but her RX was 30 units. She stated that if you needed to send it in she would like 40 units sent in for her after her appointment. Please advise.

## 2017-09-08 NOTE — Telephone Encounter (Signed)
Samples of Toujeo in fridge and pt is aware

## 2017-09-15 ENCOUNTER — Other Ambulatory Visit: Payer: Self-pay | Admitting: Physician Assistant

## 2017-09-15 DIAGNOSIS — E781 Pure hyperglyceridemia: Secondary | ICD-10-CM

## 2017-10-12 ENCOUNTER — Ambulatory Visit: Payer: BLUE CROSS/BLUE SHIELD | Admitting: Internal Medicine

## 2017-10-18 ENCOUNTER — Other Ambulatory Visit: Payer: Self-pay | Admitting: Physician Assistant

## 2017-10-18 DIAGNOSIS — E781 Pure hyperglyceridemia: Secondary | ICD-10-CM

## 2017-10-18 NOTE — Telephone Encounter (Signed)
Pt has appt with Dr. Clelia CroftShaw tomorrow at 1100. Lipid panel is due, pt aware, will be fasting. States she will f/u on refill tomorrow.

## 2017-10-19 ENCOUNTER — Other Ambulatory Visit: Payer: Self-pay | Admitting: Physician Assistant

## 2017-10-19 ENCOUNTER — Encounter: Payer: Self-pay | Admitting: Physician Assistant

## 2017-10-19 ENCOUNTER — Ambulatory Visit (INDEPENDENT_AMBULATORY_CARE_PROVIDER_SITE_OTHER): Payer: BLUE CROSS/BLUE SHIELD | Admitting: Physician Assistant

## 2017-10-19 ENCOUNTER — Other Ambulatory Visit: Payer: Self-pay

## 2017-10-19 VITALS — BP 134/88 | HR 90 | Temp 98.1°F | Resp 16 | Ht 66.0 in | Wt 208.8 lb

## 2017-10-19 DIAGNOSIS — E669 Obesity, unspecified: Secondary | ICD-10-CM

## 2017-10-19 DIAGNOSIS — E781 Pure hyperglyceridemia: Secondary | ICD-10-CM

## 2017-10-19 DIAGNOSIS — N6320 Unspecified lump in the left breast, unspecified quadrant: Secondary | ICD-10-CM

## 2017-10-19 DIAGNOSIS — I10 Essential (primary) hypertension: Secondary | ICD-10-CM | POA: Diagnosis not present

## 2017-10-19 DIAGNOSIS — Z794 Long term (current) use of insulin: Secondary | ICD-10-CM

## 2017-10-19 DIAGNOSIS — Z23 Encounter for immunization: Secondary | ICD-10-CM | POA: Diagnosis not present

## 2017-10-19 DIAGNOSIS — Z0000 Encounter for general adult medical examination without abnormal findings: Secondary | ICD-10-CM

## 2017-10-19 DIAGNOSIS — E1165 Type 2 diabetes mellitus with hyperglycemia: Secondary | ICD-10-CM

## 2017-10-19 DIAGNOSIS — Z1231 Encounter for screening mammogram for malignant neoplasm of breast: Secondary | ICD-10-CM | POA: Diagnosis not present

## 2017-10-19 DIAGNOSIS — Z124 Encounter for screening for malignant neoplasm of cervix: Secondary | ICD-10-CM | POA: Diagnosis not present

## 2017-10-19 NOTE — Progress Notes (Signed)
Patient ID: Carolyn Butler, female    DOB: Jan 14, 1971, 47 y.o.   MRN: [id number]  PCP: Porfirio Oar, PA-C  Chief Complaint  Patient presents with  . Annual Exam    Subjective:   Presents for Annual Exam.  Diabetes managed by Dr. Elvera Lennox. Next visit is next month. Last labs 06/14/2017. A1C 6.5% Notes that her fasting glucose is higher on the Trujeo than when she was on Lantus.  Cervical Cancer Screening: 11/14/2014, normal cytology; due today. No previous abnormal pap test Breast Cancer Screening: no previous mammogram.  Colorectal Cancer Screening: not yet a candidate Bone Density Testing: not yet a candidate HIV Screening: very low risk, declines STI Screening: very low risk, declines Seasonal Influenza Vaccination: declines Td/Tdap Vaccination: declines Pneumococcal Vaccination: due for series due to diabetes, declines today Zoster Vaccination: not yet a candidate Frequency of Dental evaluation: Q6 months Frequency of Eye evaluation: not in a while   Review of Systems  Constitutional: Negative.   HENT: Negative.   Eyes: Negative.   Respiratory: Negative.   Cardiovascular: Negative.   Gastrointestinal: Negative.   Endocrine: Negative.   Genitourinary: Negative.        LEFT breast lump, tenderness  Musculoskeletal: Negative.   Skin: Negative.   Allergic/Immunologic: Negative.   Neurological: Negative.   Hematological: Negative.   Psychiatric/Behavioral: Negative.        Patient Active Problem List   Diagnosis Date Noted  . Controlled diabetes mellitus with long-term current use of insulin (HCC) 08/14/2015  . Pseudohyponatremia 12/14/2012  . Hypertriglyceridemia 12/14/2012  . Benign essential HTN 11/20/2011  . Obesity, Class II, BMI 35-39.9, with comorbidity 11/20/2011  . Sleep disturbance 11/20/2011   Past Medical History:  Diagnosis Date  . Diabetes mellitus without complication (HCC)   . Hypertension    Past Surgical History:  Procedure  Laterality Date  . surgery on right knee      Prior to Admission medications   Medication Sig Start Date End Date Taking? Authorizing Provider  aspirin 81 MG tablet Take 81 mg by mouth daily.   Yes [provider]  cyclobenzaprine (FLEXERIL) 10 MG tablet TAKE ONE TABLET BY MOUTH AT BEDTIME AS NEEDED FOR MUSCLE SPASM 03/12/17  Yes Orelia Brandstetter, PA-C  gemfibrozil (LOPID) 600 MG tablet TAKE 1 TABLET BY MOUTH TWICE DAILY WITH MEALS 09/19/17  Yes Laasia Arcos, PA-C  glucose blood (ONE TOUCH ULTRA TEST) test strip Use to test blood sugar 3 times daily as instructed. 11/26/13  Yes Carlus Pavlov, MD  insulin aspart (NOVOLOG FLEXPEN) 100 UNIT/ML FlexPen INJECT 13-16 UNITS SUBCUTANEOUSLY  THREE TIMES DAILY (PT IS TO GET 5 PENS AS PRESCRIBED) 09/08/17  Yes Carlus Pavlov, MD  Insulin Glargine (LANTUS SOLOSTAR) 100 UNIT/ML Solostar Pen INJECT 30-35 UNITS INTO THE SKIN EACH NIGHT AT BEDTIME (PT IS TO GET 5 PENS AS PRESCRIBED) 09/08/17  Yes Carlus Pavlov, MD  Insulin Pen Needle 32G X 4 MM MISC Use to inject insulin 4 times daily. 02/18/17  Yes Carlus Pavlov, MD  losartan (COZAAR) 50 MG tablet TAKE 1 TABLET BY MOUTH ONCE DAILY 03/12/17  Yes Ramaj Frangos, PA-C  metFORMIN (GLUCOPHAGE) 1000 MG tablet TAKE ONE TABLET BY MOUTH TWICE DAILY 08/15/17  Yes Carlus Pavlov, MD  Multiple Vitamin (MULTIVITAMIN) tablet Take 1 tablet by mouth daily.   Yes [provider]  Omega-3 Fatty Acids (OMEGA 3 PO) Take by mouth 2 (two) times daily with a meal.   Yes [provider]  VITAMIN E PO  Take by mouth daily.   Yes [provider]     Allergies  Allergen Reactions  . Doxycycline    Family History  Problem Relation Age of Onset  . Diabetes Father   . Heart disease Father   . Hyperlipidemia Father   . Diabetes Paternal Grandmother   . Heart disease Paternal Grandmother   . Diabetes Brother    Social History   Socioeconomic History  . Marital status: Married     Spouse name: Luz BrazenVincent Hefty  . Number of children: 0  . Years of education: 12+  . Highest education level: Not on file  Occupational History  . Occupation: unemployed    Comment: Warden/rangerbanker  Social Needs  . Financial resource strain: Not on file  . Food insecurity:    Worry: Not on file    Inability: Not on file  . Transportation needs:    Medical: Not on file    Non-medical: Not on file  Tobacco Use  . Smoking status: Never Smoker  . Smokeless tobacco: Never Used  Substance and Sexual Activity  . Alcohol use: No    Comment: twice a year mixed drink or red wine  . Drug use: No  . Sexual activity: Yes  Lifestyle  . Physical activity:    Days per week: Not on file    Minutes per session: Not on file  . Stress: Not on file  Relationships  . Social connections:    Talks on phone: Not on file    Gets together: Not on file    Attends religious service: Not on file    Active member of club or organization: Not on file    Attends meetings of clubs or organizations: Not on file    Relationship status: Not on file  . Intimate partner violence:    Fear of current or ex partner: Not on file    Emotionally abused: Not on file    Physically abused: Not on file    Forced sexual activity: Not on file  Other Topics Concern  . Not on file  Social History Narrative   Lives with her husband.   Laid-off from job at [employer] (2014), and hasn't returned to work.       Objective:  Physical Exam  Constitutional: She is oriented to person, place, and time. Vital signs are normal. She appears well-developed and well-nourished. She is active and cooperative. No distress.  BP 134/88   Pulse 90   Temp 98.1 F (36.7 C)   Resp 16   Ht 5\' 6"  (1.676 m)   Wt 208 lb 12.8 oz (94.7 kg)   SpO2 99%   BMI 33.70 kg/m    HENT:  Head: Normocephalic and atraumatic.  Right Ear: Hearing, tympanic membrane, external ear and ear canal normal. No foreign bodies.  Left Ear: Hearing, tympanic  membrane, external ear and ear canal normal. No foreign bodies.  Nose: Nose normal.  Mouth/Throat: Uvula is midline, oropharynx is clear and moist and mucous membranes are normal. No oral lesions. Normal dentition. No dental abscesses or uvula swelling. No oropharyngeal exudate.  Eyes: Pupils are equal, round, and reactive to light. Conjunctivae, EOM and lids are normal. Right eye exhibits no discharge. Left eye exhibits no discharge. No scleral icterus.  Fundoscopic exam:      The right eye shows no arteriolar narrowing, no AV nicking, no exudate, no hemorrhage and no papilledema.       The left eye shows no arteriolar  narrowing, no AV nicking, no exudate, no hemorrhage and no papilledema.  Neck: Trachea normal, normal range of motion and full passive range of motion without pain. Neck supple. No spinous process tenderness and no muscular tenderness present. No thyroid mass and no thyromegaly present.  Cardiovascular: Normal rate, regular rhythm, normal heart sounds, intact distal pulses and normal pulses.  Pulmonary/Chest: Effort normal and breath sounds normal. She exhibits no tenderness and no retraction. Right breast exhibits no inverted nipple, no mass, no nipple discharge, no skin change and no tenderness. Left breast exhibits no inverted nipple, no mass, no nipple discharge, no skin change and no tenderness. No breast swelling, tenderness, discharge or bleeding. Breasts are symmetrical.    Abdominal: Soft. Normal appearance and bowel sounds are normal. She exhibits no distension and no mass. There is no hepatosplenomegaly. There is no tenderness. There is no rigidity, no rebound, no guarding, no CVA tenderness, no tenderness at McBurney's point and negative Murphy's sign. No hernia. Hernia confirmed negative in the right inguinal area and confirmed negative in the left inguinal area.  Genitourinary: Rectum normal, vagina normal and uterus normal. Rectal exam shows no external hemorrhoid and no  fissure. No breast swelling, tenderness, discharge or bleeding. Pelvic exam was performed with patient supine. No labial fusion. There is no rash, tenderness, lesion or injury on the right labia. There is no rash, tenderness, lesion or injury on the left labia. Cervix exhibits no motion tenderness, no discharge and no friability. Right adnexum displays no mass, no tenderness and no fullness. Left adnexum displays no mass, no tenderness and no fullness. No erythema, tenderness or bleeding in the vagina. No foreign body in the vagina. No signs of injury around the vagina. No vaginal discharge found.  Musculoskeletal: She exhibits no edema or tenderness.       Cervical back: Normal.       Thoracic back: Normal.       Lumbar back: Normal.  Lymphadenopathy:       Head (right side): No tonsillar, no preauricular, no posterior auricular and no occipital adenopathy present.       Head (left side): No tonsillar, no preauricular, no posterior auricular and no occipital adenopathy present.    She has no cervical adenopathy.    She has no axillary adenopathy.       Right: No inguinal and no supraclavicular adenopathy present.       Left: No inguinal and no supraclavicular adenopathy present.  Neurological: She is alert and oriented to person, place, and time. She has normal strength and normal reflexes. No cranial nerve deficit. She exhibits normal muscle tone. Coordination and gait normal.  Skin: Skin is warm, dry and intact. No rash noted. She is not diaphoretic. No cyanosis or erythema. Nails show no clubbing.  Psychiatric: She has a normal mood and affect. Her speech is normal and behavior is normal. Judgment and thought content normal.    Visual Acuity Screening   Right eye Left eye Both eyes  Without correction: 20/20 20/20 20/20   With correction:       Wt Readings from Last 3 Encounters:  10/19/17 208 lb 12.8 oz (94.7 kg)  06/14/17 221 lb (100.2 kg)  03/12/17 218 lb (98.9 kg)       Assessment  & Plan:   Problem List Items Addressed This Visit    Hypertriglyceridemia (Chronic)    Currently on gemfibrozil. Due to diabetes, statin recommended. Await lab results, and consider change to or addition of statin.  Relevant Orders   Lipid panel (Completed)   Comprehensive metabolic panel (Completed)   Controlled diabetes mellitus with long-term current use of insulin (HCC) (Chronic)    UPdate labs today with health maintenance labs. Will forward to endocrinology for her visit next month.      Relevant Orders   Hemoglobin A1c (Completed)   Comprehensive metabolic panel (Completed)   Microalbumin / creatinine urine ratio   Benign essential HTN    Controlled. Continue losartan 50 mg daily. Encouraged healthy lifestyle changes.      Relevant Orders   CBC with Differential/Platelet (Completed)   Comprehensive metabolic panel (Completed)   TSH (Completed)   Urinalysis, dipstick only   Obesity, Class II, BMI 35-39.9    Counseled on healthy eating and regular exercise.       Other Visit Diagnoses    Annual physical exam    -  Primary   Age appropriate health guidance provided.   Screening for cervical cancer       If cytology and HPV both negative, repeat co-testing in 5 years.   Relevant Orders   Pap IG and HPV (high risk) DNA detection   Encounter for screening mammogram for breast cancer       Needs screening mammogram on the RIGHT, diagnostic on the LEFT, and [place] on the LEFT.   Relevant Orders   MM Digital Screening Unilat R   Need for Tdap vaccination       declines today, "not prepared."   Need for pneumococcal vaccination       declines today, "not prepared."   Need for influenza vaccination       declines today.   Left breast mass       [place] and diagnostic mammogram   Relevant Orders   MM DIAG BREAST TOMO UNI LEFT   US BREAST LTD UNI LEFT INC AXILLA       Return in about 6 months (around 04/21/2018) for re-evalaution  of blood pressure, choelsterol,  etc.   Fernande Bras, PA-C Primary Care at [facility]

## 2017-10-19 NOTE — Patient Instructions (Addendum)
IF you received an x-ray today, you will receive an invoice from [facility]. Please contact [facility] at [phone] with questions or concerns regarding your invoice.   IF you received labwork today, you will receive an invoice from [facility]. Please contact [facility] at [phone] with questions or concerns regarding your invoice.   Our billing staff will not be able to assist you with questions regarding bills from these companies.  You will be contacted with the lab results as soon as they are available. The fastest way to get your results is to activate your My Chart account. Instructions are located on the last page of this paperwork. If you have not heard from [place] regarding the results in 2 weeks, please contact this office.      Preventive Care 40-64 Years, Female Preventive care refers to lifestyle choices and visits with your health care provider that can promote health and wellness. What does preventive care include?  A yearly physical exam. This is also called an annual well check.  Dental exams once or twice a year.  Routine eye exams. Ask your health care provider how often you should have your eyes checked.  Personal lifestyle choices, including: ? Daily care of your teeth and gums. ? Regular physical activity. ? Eating a healthy diet. ? Avoiding tobacco and drug use. ? Limiting alcohol use. ? Practicing safe sex. ? Taking low-dose aspirin daily starting at age 34. ? Taking vitamin and mineral supplements as recommended by your health care provider. What happens during an annual well check? The services and screenings done by your health care provider during your annual well check will depend on your age, overall health, lifestyle risk factors, and family history of disease. Counseling Your health care provider may ask you questions about your:  Alcohol use.  Tobacco use.  Drug use.  Emotional well-being.  Home and relationship  well-being.  Sexual activity.  Eating habits.  Work and work Statistician.  Method of birth control.  Menstrual cycle.  Pregnancy history.  Screening You may have the following tests or measurements:  Height, weight, and BMI.  Blood pressure.  Lipid and cholesterol levels. These may be checked every 5 years, or more frequently if you are over 65 years old.  Skin check.  Lung cancer screening. You may have this screening every year starting at age 102 if you have a 30-pack-year history of smoking and currently smoke or have quit within the past 15 years.  Fecal occult blood test (FOBT) of the stool. You may have this test every year starting at age 33.  Flexible sigmoidoscopy or colonoscopy. You may have a sigmoidoscopy every 5 years or a colonoscopy every 10 years starting at age 23.  Hepatitis C blood test.  Hepatitis B blood test.  Sexually transmitted disease (STD) testing.  Diabetes screening. This is done by checking your blood sugar (glucose) after you have not eaten for a while (fasting). You may have this done every 1-3 years.  Mammogram. This may be done every 1-2 years. Talk to your health care provider about when you should start having regular mammograms. This may depend on whether you have a family history of breast cancer.  BRCA-related cancer screening. This may be done if you have a family history of breast, ovarian, tubal, or peritoneal cancers.  Pelvic exam and Pap test. This may be done every 3 years starting at age 3. Starting at age 55, this may be done every 5 years if  you have a Pap test in combination with an HPV test.  Bone density scan. This is done to screen for osteoporosis. You may have this scan if you are at high risk for osteoporosis.  Discuss your test results, treatment options, and if necessary, the need for more tests with your health care provider. Vaccines Your health care provider may recommend certain vaccines, such  as:  Influenza vaccine. This is recommended every year.  Tetanus, diphtheria, and acellular pertussis (Tdap, Td) vaccine. You may need a Td booster every 10 years.  Varicella vaccine. You may need this if you have not been vaccinated.  Zoster vaccine. You may need this after age 77.  Measles, mumps, and rubella (MMR) vaccine. You may need at least one dose of MMR if you were born in 1957 or later. You may also need a second dose.  Pneumococcal 13-valent conjugate (PCV13) vaccine. You may need this if you have certain conditions and were not previously vaccinated.  Pneumococcal polysaccharide (PPSV23) vaccine. You may need one or two doses if you smoke cigarettes or if you have certain conditions.  Meningococcal vaccine. You may need this if you have certain conditions.  Hepatitis A vaccine. You may need this if you have certain conditions or if you travel or work in places where you may be exposed to hepatitis A.  Hepatitis B vaccine. You may need this if you have certain conditions or if you travel or work in places where you may be exposed to hepatitis B.  Haemophilus influenzae type b (Hib) vaccine. You may need this if you have certain conditions.  Talk to your health care provider about which screenings and vaccines you need and how often you need them. This information is not intended to replace advice given to you by your health care provider. Make sure you discuss any questions you have with your health care provider. Document Released: 08/08/2015 Document Revised: 03/31/2016 Document Reviewed: 05/13/2015 Elsevier Interactive Patient Education  [employer].

## 2017-10-20 ENCOUNTER — Telehealth: Payer: Self-pay | Admitting: Physician Assistant

## 2017-10-20 ENCOUNTER — Other Ambulatory Visit: Payer: Self-pay | Admitting: *Deleted

## 2017-10-20 DIAGNOSIS — I10 Essential (primary) hypertension: Secondary | ICD-10-CM

## 2017-10-20 DIAGNOSIS — E781 Pure hyperglyceridemia: Secondary | ICD-10-CM

## 2017-10-20 LAB — COMPREHENSIVE METABOLIC PANEL
A/G RATIO: 1.6 (ref 1.2–2.2)
ALT: 20 IU/L (ref 0–32)
AST: 18 IU/L (ref 0–40)
Albumin: 4.9 g/dL (ref 3.5–5.5)
Alkaline Phosphatase: 31 IU/L — ABNORMAL LOW (ref 39–117)
BILIRUBIN TOTAL: 0.3 mg/dL (ref 0.0–1.2)
BUN/Creatinine Ratio: 16 (ref 9–23)
BUN: 10 mg/dL (ref 6–24)
CHLORIDE: 97 mmol/L (ref 96–106)
CO2: 21 mmol/L (ref 20–29)
Calcium: 10 mg/dL (ref 8.7–10.2)
Creatinine, Ser: 0.61 mg/dL (ref 0.57–1.00)
GFR calc non Af Amer: 108 mL/min/{1.73_m2} (ref >59)
GFR, EST AFRICAN AMERICAN: 125 mL/min/{1.73_m2} (ref >59)
GLUCOSE: 116 mg/dL — AB (ref 65–99)
Globulin, Total: 3 g/dL (ref 1.5–4.5)
POTASSIUM: 4.5 mmol/L (ref 3.5–5.2)
Sodium: 139 mmol/L (ref 134–144)
Total Protein: 7.9 g/dL (ref 6.0–8.5)

## 2017-10-20 LAB — LIPID PANEL
CHOL/HDL RATIO: 5.4 ratio — AB (ref 0.0–4.4)
Cholesterol, Total: 166 mg/dL (ref 100–199)
HDL: 31 mg/dL — AB (ref >39)
LDL Calculated: 101 mg/dL — ABNORMAL HIGH (ref 0–99)
TRIGLYCERIDES: 168 mg/dL — AB (ref 0–149)
VLDL CHOLESTEROL CAL: 34 mg/dL (ref 5–40)

## 2017-10-20 LAB — CBC WITH DIFFERENTIAL/PLATELET
BASOS: 0 %
Basophils Absolute: 0 10*3/uL (ref 0.0–0.2)
EOS (ABSOLUTE): 0.1 10*3/uL (ref 0.0–0.4)
Eos: 1 %
HEMOGLOBIN: 12 g/dL (ref 11.1–15.9)
Hematocrit: 34.7 % (ref 34.0–46.6)
IMMATURE GRANS (ABS): 0 10*3/uL (ref 0.0–0.1)
Immature Granulocytes: 0 %
LYMPHS ABS: 3.8 10*3/uL — AB (ref 0.7–3.1)
LYMPHS: 37 %
MCH: 28.6 pg (ref 26.6–33.0)
MCHC: 34.6 g/dL (ref 31.5–35.7)
MCV: 83 fL (ref 79–97)
MONOCYTES: 4 %
Monocytes Absolute: 0.4 10*3/uL (ref 0.1–0.9)
NEUTROS ABS: 5.8 10*3/uL (ref 1.4–7.0)
Neutrophils: 58 %
Platelets: 568 10*3/uL — ABNORMAL HIGH (ref 150–379)
RBC: 4.19 x10E6/uL (ref 3.77–5.28)
RDW: 14.5 % (ref 12.3–15.4)
WBC: 10.1 10*3/uL (ref 3.4–10.8)

## 2017-10-20 LAB — URINALYSIS, DIPSTICK ONLY
Bilirubin, UA: NEGATIVE
Glucose, UA: NEGATIVE
KETONES UA: NEGATIVE
LEUKOCYTES UA: NEGATIVE
NITRITE UA: NEGATIVE
PH UA: 5 (ref 5.0–7.5)
Protein, UA: NEGATIVE
RBC UA: NEGATIVE
Specific Gravity, UA: 1.02 (ref 1.005–1.030)
UUROB: 0.2 mg/dL (ref 0.2–1.0)

## 2017-10-20 LAB — MICROALBUMIN / CREATININE URINE RATIO
Creatinine, Urine: 111.4 mg/dL
MICROALB/CREAT RATIO: 16.2 mg/g{creat} (ref 0.0–30.0)
MICROALBUM., U, RANDOM: 18.1 ug/mL

## 2017-10-20 LAB — HEMOGLOBIN A1C
Est. average glucose Bld gHb Est-mCnc: 128 mg/dL
Hgb A1c MFr Bld: 6.1 % — ABNORMAL HIGH (ref 4.8–5.6)

## 2017-10-20 LAB — TSH: TSH: 3.06 u[IU]/mL (ref 0.450–4.500)

## 2017-10-20 MED ORDER — LOSARTAN POTASSIUM 50 MG PO TABS: ORAL_TABLET | ORAL | 3 refills | Status: DC

## 2017-10-20 MED ORDER — GEMFIBROZIL 600 MG PO TABS: ORAL_TABLET | ORAL | 5 refills | Status: DC

## 2017-10-20 NOTE — Assessment & Plan Note (Signed)
Controlled. Continue losartan 50 mg daily. Encouraged healthy lifestyle changes.

## 2017-10-20 NOTE — Telephone Encounter (Signed)
Copied from CRM [id number]. Topic: Quick Communication - Rx Refill/Question >> Oct 20, 2017  1:13 PM Alexander BergeronBarksdale, Carolyn B wrote: Medication: losartan (COZAAR) 50 MG tablet [[id number]][[id number]] , gemfibrozil (LOPID) 600 MG tablet [[id number]][[id number]]  Has the patient contacted their pharmacy? Yes.   (Agent: If no, request that the patient contact the pharmacy for the refill.) Preferred Pharmacy (with phone number or street name): [facility] [place] Agent: Please be advised that RX refills may take up to 3 business days. We ask that you follow-up with your pharmacy.

## 2017-10-20 NOTE — Assessment & Plan Note (Signed)
Currently on gemfibrozil. Due to diabetes, statin recommended. Await lab results, and consider change to or addition of statin.

## 2017-10-20 NOTE — Assessment & Plan Note (Signed)
UPdate labs today with health maintenance labs. Will forward to endocrinology for her visit next month.

## 2017-10-20 NOTE — Telephone Encounter (Signed)
Rx refill per protocol: LOV: 10/19/17

## 2017-10-20 NOTE — Assessment & Plan Note (Signed)
Counseled on healthy eating and regular exercise.

## 2017-10-21 LAB — PAP IG AND HPV HIGH-RISK
HPV, HIGH-RISK: NEGATIVE
PAP Smear Comment: 0

## 2017-10-27 ENCOUNTER — Encounter: Payer: Self-pay | Admitting: Physician Assistant

## 2017-11-03 ENCOUNTER — Telehealth: Payer: Self-pay | Admitting: Internal Medicine

## 2017-11-03 NOTE — Telephone Encounter (Signed)
Patient stated that Lantus prescription was prescribed for 30 units,  Patient said she takes 36 to 40 units of Lantus. Please advise   [facility]acy[facility]  [facility] [id number] - [place], [place] - [address] DEA #:  [id number]

## 2017-11-04 ENCOUNTER — Telehealth: Payer: Self-pay | Admitting: Internal Medicine

## 2017-11-04 ENCOUNTER — Telehealth: Payer: Self-pay | Admitting: Physician Assistant

## 2017-11-04 NOTE — Telephone Encounter (Signed)
Patient called stated she is having refill issues, wanting a call back. Did not state pharmacy or medications   She called the [facility].

## 2017-11-04 NOTE — Telephone Encounter (Signed)
Copied from CRM [id number]. Topic: Quick Communication - See Telephone Encounter >> Nov 04, 2017  4:11 PM Arlyss Gandyichardson, Kristyna Bradstreet N, NT wrote: CRM for notification. See Telephone encounter for: 11/04/17. Pt would like a call to discuss her lab results.

## 2017-11-07 ENCOUNTER — Other Ambulatory Visit: Payer: Self-pay | Admitting: Internal Medicine

## 2017-11-07 ENCOUNTER — Telehealth: Payer: Self-pay | Admitting: Internal Medicine

## 2017-11-07 MED ORDER — INSULIN PEN NEEDLE 32G X 4 MM MISC: 5 refills | Status: DC

## 2017-11-07 NOTE — Telephone Encounter (Signed)
Spoke to patient. Noted on previous call.  Closing encounter.

## 2017-11-07 NOTE — Telephone Encounter (Signed)
Spoke to patient.  Advised showing refills on Lantus and Novolog and confirmed pharmacy. Pt stated she has not contacted pharmacy, but would do so today. Confirmed upcoming appt w/ Dr. Reece AgarG on 11/15/17.

## 2017-11-07 NOTE — Telephone Encounter (Signed)
Ok to call in Lantus.

## 2017-11-07 NOTE — Telephone Encounter (Signed)
Provider, please review patient labwork and advise.

## 2017-11-07 NOTE — Telephone Encounter (Signed)
Pt c/o Tujeo not working. States med is causing HA'(s).  She is requesting to go back to Lantus 36-40 units.  Requesting pen needle(s) refill also.  Sent refill for pen needle.

## 2017-11-07 NOTE — Telephone Encounter (Signed)
Patient is returning your call.  

## 2017-11-08 MED ORDER — INSULIN GLARGINE 100 UNIT/ML SOLOSTAR PEN: PEN_INJECTOR | SUBCUTANEOUS | 5 refills | Status: DC

## 2017-11-08 NOTE — Telephone Encounter (Signed)
Labs 10/19/2017:  CBC: platelets are up a little, possibly due to recent viral illness. Will recheck at next visit. A1C: down from last time of 6.5% to 6.1%. Keep up the great work! Lipids: stable, but HDL is lower and LDL higher than recommended. I recommend starting a statin, if she is agreeable, I will send it electronically to her pharmacy. Kidney and liver tests: normal  Thyroid, Urine and urine microalbumin are normal.

## 2017-11-08 NOTE — Telephone Encounter (Signed)
Done

## 2017-11-09 MED ORDER — ATORVASTATIN CALCIUM 10 MG PO TABS: 10.0000 mg | ORAL_TABLET | Freq: Every day | ORAL | 3 refills | Status: DC

## 2017-11-09 NOTE — Telephone Encounter (Signed)
Meds ordered this encounter  Medications  . atorvastatin (LIPITOR) 10 MG tablet    Sig: Take 1 tablet (10 mg total) by mouth daily.    Dispense:  90 tablet    Refill:  3    Order Specific Question:   Supervising Provider    Answer:   Clelia CroftSHAW, EVA N [[id number]]

## 2017-11-09 NOTE — Telephone Encounter (Signed)
Phone call to patient. Relayed message from [place], she is agreeable to starting statin. Preferred pharmacy updated.   Provider, please send prescription for statin to patient pharmacy.

## 2017-11-15 ENCOUNTER — Ambulatory Visit (INDEPENDENT_AMBULATORY_CARE_PROVIDER_SITE_OTHER): Payer: BLUE CROSS/BLUE SHIELD | Admitting: Internal Medicine

## 2017-11-15 ENCOUNTER — Encounter: Payer: Self-pay | Admitting: Internal Medicine

## 2017-11-15 VITALS — BP 118/68 | HR 84 | Ht 66.0 in | Wt 216.2 lb

## 2017-11-15 DIAGNOSIS — Z794 Long term (current) use of insulin: Secondary | ICD-10-CM | POA: Diagnosis not present

## 2017-11-15 DIAGNOSIS — E1165 Type 2 diabetes mellitus with hyperglycemia: Secondary | ICD-10-CM | POA: Diagnosis not present

## 2017-11-15 DIAGNOSIS — E781 Pure hyperglyceridemia: Secondary | ICD-10-CM | POA: Diagnosis not present

## 2017-11-15 MED ORDER — INSULIN GLARGINE 100 UNIT/ML SOLOSTAR PEN: PEN_INJECTOR | SUBCUTANEOUS | 5 refills | Status: DC

## 2017-11-15 MED ORDER — INSULIN ASPART 100 UNIT/ML FLEXPEN: PEN_INJECTOR | SUBCUTANEOUS | 3 refills | Status: DC

## 2017-11-15 NOTE — Progress Notes (Signed)
Patient ID: Carolyn Butler, female   DOB: April 23, 1971, 47 y.o.   MRN: [id number]  HPI: Carolyn Butler is a 47 y.o.-year-old female, returning for f/u for DM2, dx 2005, insulin-dependent since 01/2013, controlled, without complications. Last visit was 6 mo ago.  Last hemoglobin A1c was higher: Lab Results  Component Value Date   HGBA1C 6.1 (H) 10/19/2017   HGBA1C 6.5 06/14/2017   HGBA1C 6.6 (H) 03/12/2017  Previous A1c was 8.4% in 06/2012.  She has a h/o med noncompliance per PCPs notes.    She is on: - Metformin 1000 mg 2x a day - Lantus 36-40 units at night. Tried Toujeo >> HAs - NovoLog - now taking 16 units with all meals   Pt was on Amaryl in the past and gained 15 lbs >> stopped it and lost the weight We stopped Januvia 100 mg in 01/2013 b/c increased TG and high risk of Pancreatitis.   Pt checks her sugars 3x a day: - am: 103, 114-134  >> 140s, 171 >> 130-155, 166 - 2h after breakfast:206, 240 >> n/c - before lunch:120s >> 88, 99 >> n/c  - 2h after lunch: 170-180 >> 163 >> 151-161 - before dinner: 115-149 >> 180, 193, 197 (snack) - bedtime: 140-180 >> n/c >> 182, 189 >> 94, 173 Lowest sugar was 103 >> 115 >> 94; she has hypoglycemia awareness in the 80s. Highest sugar was 180 >> 243 >> 197.  No CKD, last BUN/creatinine was:  Lab Results  Component Value Date   BUN 10 10/19/2017   CREATININE 0.61 10/19/2017  On Losartan.  + HL; Last set of lipids: Lab Results  Component Value Date   CHOL 166 10/19/2017   HDL 31 (L) 10/19/2017   LDLCALC 101 (H) 10/19/2017   LDLDIRECT 86.8 02/16/2013   TRIG 168 (H) 10/19/2017   CHOLHDL 5.4 (H) 10/19/2017   Prev: Lab Results  Component Value Date   CHOL 170 05/09/2013   HDL 28* 05/09/2013   LDLCALC Comment:   Not calculated due to Triglyceride >400.  05/09/2013   LDLDIRECT 86.8 02/16/2013   TRIG 790* 05/09/2013   CHOLHDL 6.1 05/09/2013  On omega 3 FAs- 1000 mg 2x a day, Gemfibrozil 600 mg 2x a day >> TG decreased from 4500 to  790 to 170 >> 168  Pt's last eye exam was in 02/2017: No DR No numbness and tingling in her legs.   She also has a history of HTN, obesity class 2.  ROS: Constitutional: no weight gain/no weight loss, + fatigue, no subjective hyperthermia, no subjective hypothermia Eyes: no blurry vision, no xerophthalmia ENT: no sore throat, no nodules palpated in throat, no dysphagia, no odynophagia, no hoarseness Cardiovascular: no CP/no SOB/no palpitations/no leg swelling Respiratory: no cough/no SOB/no wheezing Gastrointestinal: no N/no V/no D/no C/no acid reflux Musculoskeletal: + muscle aches/+ joint aches Skin: no rashes, no hair loss Neurological: no tremors/no numbness/no tingling/no dizziness, + HA  I reviewed pt's medications, allergies, PMH, social hx, family hx, and changes were documented in the history of present illness. Otherwise, unchanged from my initial visit note.  PE: BP 118/68   Pulse 84   Ht 5\' 6"  (1.676 m)   Wt 216 lb 3.2 oz (98.1 kg)   SpO2 98%   BMI 34.90 kg/m  Body mass index is 34.9 kg/m. Wt Readings from Last 3 Encounters:  11/15/17 216 lb 3.2 oz (98.1 kg)  10/19/17 208 lb 12.8 oz (94.7 kg)  06/14/17 221 lb (100.2 kg)  Constitutional: overweight, in NAD Eyes: PERRLA, EOMI, no exophthalmos ENT: moist mucous membranes, no thyromegaly, no cervical lymphadenopathy Cardiovascular: RRR, No MRG Respiratory: CTA B Gastrointestinal: abdomen soft, NT, ND, BS+ Musculoskeletal: no deformities, strength intact in all 4 Skin: moist, warm, no rashes Neurological: no tremor with outstretched hands, DTR normal in all 4  ASSESSMENT: 1. DM2, insulin-dependent, controlled, without long term complications  2. HTG  PLAN:  1.  Patient with h/o good DM control, with still higher sugars at this visit despite a recent HbA1c at goal last month. - she continues on a basal bolus regimen, with a fixed dose of Novolog - does not vary the dose depending on the meal >> advised to do  so - no other changes are necessary for now - I advised her to:  Patient Instructions  Please continue: - Metformin 1000 mg 2x a day - Lantus 36-40 units at night  Please increase: - NovoLog 16-19 units before meals  Please return in 6 months with your sugar log.   - continue checking sugars at different times of the day - check 3x a day, rotating checks - advised for yearly eye exams >> she is UTD - Return to clinic in 6 mo with sugar log    2. Hypertriglyceridemia - continues on lopid 600 mg 2x day and omega-3 fatty acids 1 g 2x a day. Started Lipitor 10 mg daily - started yesterday. No SEs. Discussed that she can use CoQ10 if develops mm aches (per her Q'ing). - reviewed last lipid panel >> LDL slightly high >> started statin  Carlus Pavlov, MD PhD [facility] Endocrinology

## 2017-11-15 NOTE — Patient Instructions (Addendum)
Please continue: - Metformin 1000 mg 2x a day - Lantus 36-40 units at night  Please increase: - NovoLog 16-19 units before meals  Please return in 6 months with your sugar log.

## 2018-01-31 ENCOUNTER — Other Ambulatory Visit: Payer: BLUE CROSS/BLUE SHIELD

## 2018-02-07 ENCOUNTER — Other Ambulatory Visit: Payer: Self-pay | Admitting: Physician Assistant

## 2018-02-07 ENCOUNTER — Ambulatory Visit
Admission: RE | Admit: 2018-02-07 | Discharge: 2018-02-07 | Disposition: A | Discharge status: Home / Self Care | Payer: BLUE CROSS/BLUE SHIELD | Source: Ambulatory Visit | Attending: Physician Assistant | Admitting: Physician Assistant

## 2018-02-07 DIAGNOSIS — N6320 Unspecified lump in the left breast, unspecified quadrant: Secondary | ICD-10-CM

## 2018-02-07 DIAGNOSIS — R928 Other abnormal and inconclusive findings on diagnostic imaging of breast: Secondary | ICD-10-CM

## 2018-02-07 DIAGNOSIS — N6310 Unspecified lump in the right breast, unspecified quadrant: Secondary | ICD-10-CM

## 2018-02-08 ENCOUNTER — Encounter: Payer: Self-pay | Admitting: Family Medicine

## 2018-04-19 ENCOUNTER — Telehealth: Payer: Self-pay | Admitting: Family Medicine

## 2018-04-19 NOTE — Telephone Encounter (Signed)
Copied from CRM [id number]. Topic: Quick Communication - Rx Refill/Question >> Apr 19, 2018 10:46 AM Henry Russelawoud, Jessica L wrote: Medication: gemfibrozil (LOPID) 600 MG tablet [[id number]][[id number]]    Has the patient contacted their pharmacy? Yes Preferred Pharmacy (with phone number or street name): [facility] - [place], [place] - [address] [phone] (Phone) [phone] (Fax)   Agent: Please be advised that RX refills may take up to 3 business days. We ask that you follow-up with your pharmacy.

## 2018-04-20 ENCOUNTER — Other Ambulatory Visit: Payer: Self-pay | Admitting: *Deleted

## 2018-04-20 DIAGNOSIS — E781 Pure hyperglyceridemia: Secondary | ICD-10-CM

## 2018-04-20 MED ORDER — GEMFIBROZIL 600 MG PO TABS: ORAL_TABLET | ORAL | 1 refills | Status: DC

## 2018-04-20 NOTE — Telephone Encounter (Signed)
Spoke with patient gave her 2 months supply she will schedule with Dr, Leretha Pol in November.

## 2018-05-23 ENCOUNTER — Other Ambulatory Visit: Payer: Self-pay | Admitting: Internal Medicine

## 2018-05-30 ENCOUNTER — Ambulatory Visit (INDEPENDENT_AMBULATORY_CARE_PROVIDER_SITE_OTHER): Payer: BLUE CROSS/BLUE SHIELD | Admitting: Internal Medicine

## 2018-05-30 ENCOUNTER — Encounter: Payer: Self-pay | Admitting: Internal Medicine

## 2018-05-30 VITALS — BP 120/82 | HR 81 | Ht 66.0 in | Wt 222.0 lb

## 2018-05-30 DIAGNOSIS — E66812 Obesity, class 2: Secondary | ICD-10-CM

## 2018-05-30 DIAGNOSIS — E669 Obesity, unspecified: Secondary | ICD-10-CM

## 2018-05-30 DIAGNOSIS — E781 Pure hyperglyceridemia: Secondary | ICD-10-CM | POA: Diagnosis not present

## 2018-05-30 DIAGNOSIS — Z794 Long term (current) use of insulin: Secondary | ICD-10-CM

## 2018-05-30 DIAGNOSIS — E1165 Type 2 diabetes mellitus with hyperglycemia: Secondary | ICD-10-CM | POA: Diagnosis not present

## 2018-05-30 LAB — POCT GLYCOSYLATED HEMOGLOBIN (HGB A1C): HEMOGLOBIN A1C: 6.4 % — AB (ref 4.0–5.6)

## 2018-05-30 MED ORDER — INSULIN GLARGINE 100 UNIT/ML SOLOSTAR PEN: PEN_INJECTOR | SUBCUTANEOUS | 3 refills | Status: DC

## 2018-05-30 MED ORDER — SEMAGLUTIDE(0.25 OR 0.5MG/DOS) 2 MG/1.5ML ~~LOC~~ SOPN: 0.5000 mg | PEN_INJECTOR | SUBCUTANEOUS | 5 refills | Status: DC

## 2018-05-30 MED ORDER — INSULIN ASPART 100 UNIT/ML FLEXPEN: PEN_INJECTOR | SUBCUTANEOUS | 3 refills | Status: DC

## 2018-05-30 NOTE — Patient Instructions (Addendum)
Please continue: - Metformin 1000 mg 2x a day  Please decrease: - Lantus 40 units at night - NovoLog 15-16 units before meals  Please start Ozempic 0.25 mg weekly in a.m. (for example on Sunday morning) x 4 weeks, then increase to 0.5 mg weekly in a.m. if no nausea or hypoglycemia.  Please return in 4 months with your sugar log.

## 2018-05-30 NOTE — Progress Notes (Signed)
Patient ID: Carolyn Butler, female   DOB: 07-29-1970, 47 y.o.   MRN: [id number]  HPI: Carolyn Butler is a 47 y.o.-year-old female, returning for f/u for DM2, dx 2005, insulin-dependent since 01/2013, controlled, without long-term complications. Last visit was 6 months ago.  She noticed that sugars increased lately and she increased her insulin since last visit.  She also started to gain weight.  Last hemoglobin A1c was: Lab Results  Component Value Date   HGBA1C 6.1 (H) 10/19/2017   HGBA1C 6.5 06/14/2017   HGBA1C 6.6 (H) 03/12/2017  Previous A1c was 8.4% in 06/2012.  She has a h/o med noncompliance per PCPs notes.    She is on: - Metformin 1000 mg 2x a day - Lantus 36-40 >> 40-45 units at night - NovoLog 16-19 >> 18-20 units before meals She tried Toujeo in the past but developed headaches from it. Pt was on Amaryl in the past and gained 15 lbs >> stopped it and lost the weight We stopped Januvia 100 mg in 01/2013 b/c increased TG and high risk of Pancreatitis.   Pt checks her sugars 3X a day: - am:  140s, 171 >> 130-155, 166 >> 160s, 197 - 2h after breakfast:206, 240 >> n/c - before lunch:120s >> 88, 99 >> n/c >> 130-162 - 2h after lunch: 170-180 >> 163 >> 151-161 >> n/c - before dinner: 115-149 >> 180, 193, 197 (snack) >> n/c - bedtime: n/c >> 182, 189 >> 94, 173 >> n/c  Lowest sugar was 94 >> 130; she has hypoglycemia awareness in the 80s. Highest sugar was 197 >> 197.  No CKD, last BUN/creatinine was:  Lab Results  Component Value Date   BUN 10 10/19/2017   CREATININE 0.61 10/19/2017  On losartan.  + HL; Last set of lipids: Lab Results  Component Value Date   CHOL 166 10/19/2017   HDL 31 (L) 10/19/2017   LDLCALC 101 (H) 10/19/2017   LDLDIRECT 86.8 02/16/2013   TRIG 168 (H) 10/19/2017   CHOLHDL 5.4 (H) 10/19/2017   Prev: Lab Results  Component Value Date   CHOL 170 05/09/2013   HDL 28* 05/09/2013   LDLCALC Comment:   Not calculated due to Triglyceride >400.   05/09/2013   LDLDIRECT 86.8 02/16/2013   TRIG 790* 05/09/2013   CHOLHDL 6.1 05/09/2013  On omega-3 fatty acids 1000 mg 2X a day, gemfibrozil 600 mg 2X a day, Lipitor 10 mg daily started before last visit >> triglycerides decreased gradually from 40 500-168.  Pt's last eye exam was in 12/2017: No DR No numbness and tingling in her legs.   She also has a history of HTN.  She stopped exercising: running and walking >> stopped. Plans to restart.  ROS: Constitutional: + weight gain/no weight loss, no fatigue, no subjective hyperthermia, no subjective hypothermia Eyes: no blurry vision, no xerophthalmia ENT: no sore throat, no nodules palpated in neck, no dysphagia, no odynophagia, no hoarseness Cardiovascular: no CP/no SOB/no palpitations/no leg swelling Respiratory: no cough/no SOB/no wheezing Gastrointestinal: no N/no V/no D/no C/no acid reflux Musculoskeletal: no muscle aches/no joint aches Skin: no rashes, no hair loss Neurological: no tremors/no numbness/no tingling/no dizziness, + HA  I reviewed pt's medications, allergies, PMH, social hx, family hx, and changes were documented in the history of present illness. Otherwise, unchanged from my initial visit note.  Past Medical History:  Diagnosis Date  . Diabetes mellitus without complication (HCC)   . Hypertension    Past Surgical History:  Procedure Laterality Date  .  surgery on right knee     Social History   Socioeconomic History  . Marital status: Married    Spouse name: Carolyn Butler  . Number of children: 0  . Years of education: 12+  . Highest education level: Not on file  Occupational History  . Occupation: unemployed    Comment: Warden/ranger  . Financial resource strain: Not on file  . Food insecurity:    Worry: Not on file    Inability: Not on file  . Transportation needs:    Medical: Not on file    Non-medical: Not on file  Tobacco Use  . Smoking status: Never Smoker  . Smokeless tobacco:  Never Used  Substance and Sexual Activity  . Alcohol use: No    Comment: twice a year mixed drink or red wine  . Drug use: No  . Sexual activity: Yes  Lifestyle  . Physical activity:    Days per week: Not on file    Minutes per session: Not on file  . Stress: Not on file  Relationships  . Social connections:    Talks on phone: Not on file    Gets together: Not on file    Attends religious service: Not on file    Active member of club or organization: Not on file    Attends meetings of clubs or organizations: Not on file    Relationship status: Not on file  . Intimate partner violence:    Fear of current or ex partner: Not on file    Emotionally abused: Not on file    Physically abused: Not on file    Forced sexual activity: Not on file  Other Topics Concern  . Not on file  Social History Narrative   Lives with her husband.   Laid-off from job at [employer] (2014), and hasn't returned to work.   Current Outpatient Medications on File Prior to Visit  Medication Sig Dispense Refill  . aspirin 81 MG tablet Take 81 mg by mouth daily.    Marland Kitchen atorvastatin (LIPITOR) 10 MG tablet Take 1 tablet (10 mg total) by mouth daily. 90 tablet 3  . cyclobenzaprine (FLEXERIL) 10 MG tablet TAKE ONE TABLET BY MOUTH AT BEDTIME AS NEEDED FOR MUSCLE SPASM 60 tablet 1  . gemfibrozil (LOPID) 600 MG tablet TAKE 1 TABLET BY MOUTH TWICE DAILY WITH MEALS 60 tablet 1  . glucose blood (ONE TOUCH ULTRA TEST) test strip Use to test blood sugar 3 times daily as instructed. 100 each 11  . insulin aspart (NOVOLOG FLEXPEN) 100 UNIT/ML FlexPen INJECT 16-19 UNITS SUBCUTANEOUSLY  THREE TIMES DAILY (PT IS TO GET 5 PENS AS PRESCRIBED) 15 mL 3  . Insulin Glargine (LANTUS SOLOSTAR) 100 UNIT/ML Solostar Pen INJECT 36-40 UNITS INTO THE SKIN EACH NIGHT AT BEDTIME (PT IS TO GET 5 PENS AS PRESCRIBED) 15 mL 5  . Insulin Pen Needle 32G X 4 MM MISC Use to inject insulin 4 times daily. 150 each 5  . losartan (COZAAR) 50 MG tablet  TAKE 1 TABLET BY MOUTH ONCE DAILY 90 tablet 3  . metFORMIN (GLUCOPHAGE) 1000 MG tablet TAKE ONE TABLET BY MOUTH TWICE DAILY 180 tablet 3  . Multiple Vitamin (MULTIVITAMIN) tablet Take 1 tablet by mouth daily.    Marland Kitchen NOVOLOG FLEXPEN 100 UNIT/ML FlexPen INJECT 16 TO 19 UNITS SUBCUTANEOUSLY THREE TIMES DAILY 5 pen 0  . Omega-3 Fatty Acids (OMEGA 3 PO) Take by mouth 2 (two) times daily with a meal.    .  RELION PEN NEEDLES 32G X 4 MM MISC USE TO INJECT INSULIN 4 TIMES DAILY 150 each 5  . VITAMIN E PO Take by mouth daily.     No current facility-administered medications on file prior to visit.    Allergies  Allergen Reactions  . Doxycycline    Family History  Problem Relation Age of Onset  . Diabetes Father   . Heart disease Father   . Hyperlipidemia Father   . Diabetes Paternal Grandmother   . Heart disease Paternal Grandmother   . Diabetes Brother     PE: BP 120/82   Pulse 81   Ht 5\' 6"  (1.676 m) Comment: measured  Wt 222 lb (100.7 kg)   SpO2 96%   BMI 35.83 kg/m  Body mass index is 35.83 kg/m. Wt Readings from Last 3 Encounters:  05/30/18 222 lb (100.7 kg)  11/15/17 216 lb 3.2 oz (98.1 kg)  10/19/17 208 lb 12.8 oz (94.7 kg)   Constitutional: overweight, in NAD Eyes: PERRLA, EOMI, no exophthalmos ENT: moist mucous membranes, no thyromegaly, no cervical lymphadenopathy Cardiovascular: RRR, No MRG Respiratory: CTA B Gastrointestinal: abdomen soft, NT, ND, BS+ Musculoskeletal: no deformities, strength intact in all 4 Skin: moist, warm, no rashes Neurological: no tremor with outstretched hands, DTR normal in all 4  ASSESSMENT: 1. DM2, insulin-dependent, controlled, without long term complications  2. HTG  3.  Obesity  PLAN:  1.  Patient with history of good diabetes control, with good A1c levels, on metformin and basal-bolus insulin regimen.  At last visit, she was not varying the dose of NovoLog depending on the meal and I advised her to do so.  We did not change the  regimen otherwise.  However, since last visit, sugars worsen so she did increase her Lantus and NovoLog doses.  However, sugars are still not at goal.  She is planning to start exercising again and I think this will help, however, I do not feel that this is enough.  We discussed about adding a GLP-1 receptor agonist now that her triglycerides are close to goal and the risk of pancreatitis is lower.  We discussed about benefits and possible side effects.  I think this will help with both improvement of her sugars and also her weight gain.  Discussed about nausea after the first injection which usually gets better at subsequent injections.  We will started a low dose and advance as tolerated.  In the meantime, we will back off her insulin doses and I advised her that we may need to decrease them even more depending on how she does on the Ozempic. - I advised her to:  Patient Instructions  Please continue: - Metformin 1000 mg 2x a day  Please decrease: - Lantus 40 units at night - NovoLog 15-16 units before meals  Please start Ozempic 0.25 mg weekly in a.m. (for example on Sunday morning) x 4 weeks, then increase to 0.5 mg weekly in a.m. if no nausea or hypoglycemia.  Please return in 4 months with your sugar log.   - HbA1c today: 6.4% (higher) - continue checking sugars at different times of the day - check 3x a day, rotating checks - advised for yearly eye exams >> she is UTD - refuses flu shot today - Return to clinic in 6 mo with sugar log    2. Hypertriglyceridemia - Reviewed latest lipid panel from 09/2017: LDL slightly higher than target, triglycerides only slightly above goal, and HDL low. Lab Results  Component Value Date  CHOL 166 10/19/2017   HDL 31 (L) 10/19/2017   LDLCALC 101 (H) 10/19/2017   LDLDIRECT 86.8 02/16/2013   TRIG 168 (H) 10/19/2017   CHOLHDL 5.4 (H) 10/19/2017  - Continues Lopid 600 mg 2X a day and omega-3 fatty acids 1 g 2X a day.  Also, she is on Lipitor 10 mg  daily without side effects.  3.  Obesity class II -Weight continues to worsen: + 6 lbs since last visit, + 8 lbs before last visit -We will start GLP-1 receptor agonist and back off her insulin doses  Carolyn Pavlov, MD PhD [facility] Endocrinology

## 2018-05-31 ENCOUNTER — Telehealth: Payer: Self-pay | Admitting: Family Medicine

## 2018-05-31 NOTE — Telephone Encounter (Signed)
Called and spoke with pt regarding appt with [facility] on 11/7. Advised that [facility] is not accepting NEW pts. I was able to schedule her with Creta Levin on 11/12 at 11 AM. I advised of time, building and late policy. Pt acknowledged.

## 2018-06-01 ENCOUNTER — Ambulatory Visit: Payer: BLUE CROSS/BLUE SHIELD | Admitting: Physician Assistant

## 2018-06-01 ENCOUNTER — Ambulatory Visit: Payer: BLUE CROSS/BLUE SHIELD | Admitting: Emergency Medicine

## 2018-06-06 ENCOUNTER — Ambulatory Visit: Payer: BLUE CROSS/BLUE SHIELD | Admitting: Family Medicine

## 2018-06-16 ENCOUNTER — Other Ambulatory Visit: Payer: Self-pay | Admitting: Family Medicine

## 2018-06-16 DIAGNOSIS — E781 Pure hyperglyceridemia: Secondary | ICD-10-CM

## 2018-06-16 NOTE — Telephone Encounter (Signed)
Courtesy refill  

## 2018-07-06 ENCOUNTER — Other Ambulatory Visit: Payer: Self-pay

## 2018-07-06 ENCOUNTER — Ambulatory Visit (INDEPENDENT_AMBULATORY_CARE_PROVIDER_SITE_OTHER): Payer: BLUE CROSS/BLUE SHIELD | Admitting: Family Medicine

## 2018-07-06 ENCOUNTER — Encounter: Payer: Self-pay | Admitting: Family Medicine

## 2018-07-06 VITALS — BP 132/83 | HR 86 | Temp 98.6°F | Resp 16 | Ht 66.0 in | Wt 219.2 lb

## 2018-07-06 DIAGNOSIS — I10 Essential (primary) hypertension: Secondary | ICD-10-CM

## 2018-07-06 DIAGNOSIS — Z760 Encounter for issue of repeat prescription: Secondary | ICD-10-CM

## 2018-07-06 DIAGNOSIS — E669 Obesity, unspecified: Secondary | ICD-10-CM

## 2018-07-06 DIAGNOSIS — M62838 Other muscle spasm: Secondary | ICD-10-CM

## 2018-07-06 DIAGNOSIS — E1165 Type 2 diabetes mellitus with hyperglycemia: Secondary | ICD-10-CM | POA: Diagnosis not present

## 2018-07-06 DIAGNOSIS — E781 Pure hyperglyceridemia: Secondary | ICD-10-CM | POA: Diagnosis not present

## 2018-07-06 DIAGNOSIS — Z794 Long term (current) use of insulin: Secondary | ICD-10-CM

## 2018-07-06 DIAGNOSIS — G44209 Tension-type headache, unspecified, not intractable: Secondary | ICD-10-CM | POA: Insufficient documentation

## 2018-07-06 MED ORDER — CYCLOBENZAPRINE HCL 10 MG PO TABS: ORAL_TABLET | ORAL | 1 refills | Status: DC

## 2018-07-06 MED ORDER — GEMFIBROZIL 600 MG PO TABS: 600.0000 mg | ORAL_TABLET | Freq: Two times a day (BID) | ORAL | 0 refills | Status: DC

## 2018-07-06 MED ORDER — METFORMIN HCL 1000 MG PO TABS: 1000.0000 mg | ORAL_TABLET | Freq: Two times a day (BID) | ORAL | 3 refills | Status: DC

## 2018-07-06 MED ORDER — ATORVASTATIN CALCIUM 10 MG PO TABS: 10.0000 mg | ORAL_TABLET | Freq: Every day | ORAL | 3 refills | Status: DC

## 2018-07-06 MED ORDER — LOSARTAN POTASSIUM 50 MG PO TABS: ORAL_TABLET | ORAL | 3 refills | Status: DC

## 2018-07-06 NOTE — Assessment & Plan Note (Signed)
Advised  Muscle relaxer and hydration Also to eat smaller more frequent meals

## 2018-07-06 NOTE — Patient Instructions (Signed)
° ° ° °  If you have lab work done today you will be contacted with your lab results within the next 2 weeks.  If you have not heard from us then please contact us. The fastest way to get your results is to register for My Chart. ° ° °IF you received an x-ray today, you will receive an invoice from [facility]. Please contact [facility] at [phone] with questions or concerns regarding your invoice.  ° °IF you received labwork today, you will receive an invoice from [facility]. Please contact [facility] at [phone] with questions or concerns regarding your invoice.  ° °Our billing staff will not be able to assist you with questions regarding bills from these companies. ° °You will be contacted with the lab results as soon as they are available. The fastest way to get your results is to activate your My Chart account. Instructions are located on the last page of this paperwork. If you have not heard from us regarding the results in 2 weeks, please contact this office. °  ° ° ° °

## 2018-07-06 NOTE — Assessment & Plan Note (Signed)
Patient's blood pressure is at goal of 139/89 or less. Condition is stable. Continue current medications and treatment plan. I recommend that you exercise for 30-45 minutes 5 days a week. I also recommend a balanced diet with fruits and vegetables every day, lean meats, and little fried foods. The DASH diet (you can find this online) is a good example of this.  

## 2018-07-06 NOTE — Assessment & Plan Note (Signed)
Advised flexeril and rest

## 2018-07-06 NOTE — Assessment & Plan Note (Signed)
Controlled currently, advised to continue her meds as instructed by Endocrinology

## 2018-07-06 NOTE — Assessment & Plan Note (Signed)
Improved with ozempic and metformin

## 2018-07-06 NOTE — Progress Notes (Signed)
Established Patient Office Visit  Subjective:  Patient ID: Carolyn Butler, female    DOB: 01/27/1971  Age: 47 y.o. MRN: [id number]  CC:  Chief Complaint  Patient presents with  . Establish Care    former chelle pt  . Medication Refill    metformin, cyclobenzaprine, losartan, gemfibrozil.  Per pt having some side effects of ha's, food taste different, loss of weight and feeling down since taking the semaglutide    HPI Carolyn Butler presents for visit to establish care with a new provider   Muscle spasm She has thoracic back spasms on the right below the scapula She reports that she was taking some expired cyclobenzaprine which was helping    Headache She reports intermittent headches that are either at the back of the neck or at the temple and alternates sides She reports that she usually gets headaches but these are different Her typical headaches are usually menstrual and typically with her period cycles  She states that this headache has been every day but at random times during the day   Diabetes Mellitus: Patient presents for follow up of diabetes. Symptoms: decreased appetite.. Symptoms have stabilized. Patient denies hyperglycemia, hypoglycemia , paresthesia of the feet and visual disturbances.  Evaluation to date has been included: hemoglobin A1C.  Home sugars: BGs consistently in an acceptable range.  She is taking her insulin and Semaglutide (ozempic). She is not sure if it is not the cause of the headaches. Her BG ranges from 123-145  Lab Results  Component Value Date   HGBA1C 6.4 (A) 05/30/2018   Wt Readings from Last 3 Encounters:  07/06/18 219 lb 3.2 oz (99.4 kg)  05/30/18 222 lb (100.7 kg)  11/15/17 216 lb 3.2 oz (98.1 kg)   She states that ozempic changed her taste buds and she doe snot want to eat She is taking metformin as well 1078m twice a day  She is also taking insulin lantus 35 units novolog 16 qam and 16qpm and skipped her lunch dose since  she is not eating lunch.   Hypertension: Patient here for follow-up of elevated blood pressure. She is not exercising and is adherent to low salt diet.  Blood pressure is well controlled at home. Cardiac symptoms none. Patient denies claudication, exertional chest pressure/discomfort, fatigue, irregular heart beat, lower extremity edema, near-syncope, orthopnea and palpitations.  Cardiovascular risk factors: diabetes mellitus, hypertension, obesity (BMI >= 30 kg/m2) and sedentary lifestyle. Use of agents associated with hypertension: none. History of target organ damage: none. BP Readings from Last 3 Encounters:  07/06/18 132/83  05/30/18 120/82  11/15/17 118/68    Past Medical History:  Diagnosis Date  . Diabetes mellitus without complication ([place]   . Hypertension     Past Surgical History:  Procedure Laterality Date  . surgery on right knee      Family History  Problem Relation Age of Onset  . Diabetes Father   . Heart disease Father   . Hyperlipidemia Father   . Diabetes Paternal Grandmother   . Heart disease Paternal Grandmother   . Diabetes Brother     Social History   Socioeconomic History  . Marital status: Married    Spouse name: VToshia Larkin . Number of children: 0  . Years of education: 12+  . Highest education level: Not on file  Occupational History  . Occupation: unemployed    Comment: bVisual merchandiser . Financial resource strain: Not on file  . Food insecurity:  Worry: Not on file    Inability: Not on file  . Transportation needs:    Medical: Not on file    Non-medical: Not on file  Tobacco Use  . Smoking status: Never Smoker  . Smokeless tobacco: Never Used  Substance and Sexual Activity  . Alcohol use: No    Comment: twice a year mixed drink or red wine  . Drug use: No  . Sexual activity: Yes  Lifestyle  . Physical activity:    Days per week: Not on file    Minutes per session: Not on file  . Stress: Not on file  Relationships   . Social connections:    Talks on phone: Not on file    Gets together: Not on file    Attends religious service: Not on file    Active member of club or organization: Not on file    Attends meetings of clubs or organizations: Not on file    Relationship status: Not on file  . Intimate partner violence:    Fear of current or ex partner: Not on file    Emotionally abused: Not on file    Physically abused: Not on file    Forced sexual activity: Not on file  Other Topics Concern  . Not on file  Social History Narrative   Lives with her husband.   Laid-off from job at [employer] (2014), and hasn't returned to work.    Outpatient Medications Prior to Visit  Medication Sig Dispense Refill  . aspirin 81 MG tablet Take 81 mg by mouth daily.    Marland Kitchen glucose blood (ONE TOUCH ULTRA TEST) test strip Use to test blood sugar 3 times daily as instructed. 100 each 11  . Insulin Glargine (LANTUS SOLOSTAR) 100 UNIT/ML Solostar Pen INJECT 40 UNITS INTO THE SKIN EACH NIGHT AT BEDTIME 30 mL 3  . Insulin Pen Needle 32G X 4 MM MISC Use to inject insulin 4 times daily. 150 each 5  . Multiple Vitamin (MULTIVITAMIN) tablet Take 1 tablet by mouth daily.    . Omega-3 Fatty Acids (OMEGA 3 PO) Take by mouth 2 (two) times daily with a meal.    . RELION PEN NEEDLES 32G X 4 MM MISC USE TO INJECT INSULIN 4 TIMES DAILY 150 each 5  . Semaglutide,0.25 or 0.5MG/DOS, (OZEMPIC, 0.25 OR 0.5 MG/DOSE,) 2 MG/1.5ML SOPN Inject 0.5 mg into the skin once a week. 2 pen 5  . VITAMIN E PO Take by mouth daily.    Marland Kitchen atorvastatin (LIPITOR) 10 MG tablet Take 1 tablet (10 mg total) by mouth daily. 90 tablet 3  . cyclobenzaprine (FLEXERIL) 10 MG tablet TAKE ONE TABLET BY MOUTH AT BEDTIME AS NEEDED FOR MUSCLE SPASM 60 tablet 1  . gemfibrozil (LOPID) 600 MG tablet TAKE 1 TABLET BY MOUTH TWICE DAILY WITH MEALS 60 tablet 0  . insulin aspart (NOVOLOG FLEXPEN) 100 UNIT/ML FlexPen INJECT 15-16 UNITS SUBCUTANEOUSLY  THREE TIMES DAILY 30 mL 3  .  losartan (COZAAR) 50 MG tablet TAKE 1 TABLET BY MOUTH ONCE DAILY 90 tablet 3  . metFORMIN (GLUCOPHAGE) 1000 MG tablet TAKE ONE TABLET BY MOUTH TWICE DAILY 180 tablet 3  . NOVOLOG FLEXPEN 100 UNIT/ML FlexPen INJECT 16 TO 19 UNITS SUBCUTANEOUSLY THREE TIMES DAILY 5 pen 0   No facility-administered medications prior to visit.     Allergies  Allergen Reactions  . Doxycycline     ROS Review of Systems  Constitutional: Positive for appetite change. Negative for activity change,  chills, diaphoresis and fatigue.  HENT: Negative for ear pain, facial swelling and hearing loss.   Respiratory: Negative for apnea, cough, choking and chest tightness.   Cardiovascular: Negative for chest pain, palpitations and leg swelling.  Neurological: Positive for headaches. Negative for dizziness, seizures, syncope, facial asymmetry, speech difficulty, light-headedness and numbness.   See hpi   Objective:  BP 132/83 (BP Location: Right Arm, Patient Position: Sitting, Cuff Size: Large)   Pulse 86   Temp 98.6 F (37 C) (Oral)   Resp 16   Ht 5' 6"  (1.676 m)   Wt 219 lb 3.2 oz (99.4 kg)   LMP 06/25/2018   SpO2 98%   BMI 35.38 kg/m  Wt Readings from Last 3 Encounters:  07/06/18 219 lb 3.2 oz (99.4 kg)  05/30/18 222 lb (100.7 kg)  11/15/17 216 lb 3.2 oz (98.1 kg)    Physical Exam  Constitutional: She is oriented to person, place, and time. She appears well-developed and well-nourished.  HENT:  Head: Normocephalic and atraumatic.  Right Ear: External ear normal.  Left Ear: External ear normal.  Nose: Nose normal.  Mouth/Throat: Oropharynx is clear and moist.  Eyes: Pupils are equal, round, and reactive to light. Conjunctivae and EOM are normal.  Neck: Normal range of motion. Neck supple. No thyromegaly present.  Cardiovascular: Normal rate, regular rhythm and normal heart sounds.  No murmur heard. Pulmonary/Chest: Effort normal and breath sounds normal. No respiratory distress. She has no wheezes.  She has no rales.  Musculoskeletal: Normal range of motion.        General: No tenderness or edema.       Back:  Neurological: She is alert and oriented to person, place, and time. She has normal reflexes.  Skin: Skin is warm and dry.  Psychiatric: She has a normal mood and affect. Her behavior is normal. Judgment and thought content normal.      Health Maintenance Due  Topic Date Due  . FOOT EXAM  03/12/2018  . OPHTHALMOLOGY EXAM  03/12/2018    There are no preventive care reminders to display for this patient.  Lab Results  Component Value Date   TSH 3.060 10/19/2017   Lab Results  Component Value Date   WBC 10.1 10/19/2017   HGB 12.0 10/19/2017   HCT 34.7 10/19/2017   MCV 83 10/19/2017   PLT 568 (H) 10/19/2017   Lab Results  Component Value Date   NA 137 07/06/2018   K 4.3 07/06/2018   CO2 22 07/06/2018   GLUCOSE 112 (H) 07/06/2018   BUN 11 07/06/2018   CREATININE 0.48 (L) 07/06/2018   BILITOT 0.3 07/06/2018   ALKPHOS 33 (L) 07/06/2018   AST 19 07/06/2018   ALT 25 07/06/2018   PROT 7.9 07/06/2018   ALBUMIN 4.8 07/06/2018   CALCIUM 10.2 07/06/2018   GFR 143.30 02/16/2013   Lab Results  Component Value Date   CHOL 183 07/06/2018   Lab Results  Component Value Date   HDL 28 (L) 07/06/2018   Lab Results  Component Value Date   LDLCALC 111 (H) 07/06/2018   Lab Results  Component Value Date   TRIG 222 (H) 07/06/2018   Lab Results  Component Value Date   CHOLHDL 6.5 (H) 07/06/2018   Lab Results  Component Value Date   HGBA1C 6.4 (A) 05/30/2018      Assessment & Plan:   Problem List Items Addressed This Visit      Cardiovascular and Mediastinum   Benign essential HTN  Patient's blood pressure is at goal of 139/89 or less. Condition is stable. Continue current medications and treatment plan. I recommend that you exercise for 30-45 minutes 5 days a week. I also recommend a balanced diet with fruits and vegetables every day, lean meats, and  little fried foods. The DASH diet (you can find this online) is a good example of this.       Relevant Medications   losartan (COZAAR) 50 MG tablet   gemfibrozil (LOPID) 600 MG tablet   atorvastatin (LIPITOR) 10 MG tablet   Other Relevant Orders   Lipid panel (Completed)   CMP14+EGFR (Completed)     Endocrine   Controlled diabetes mellitus with long-term current use of insulin (HCC) (Chronic)    Controlled currently, advised to continue her meds as instructed by Endocrinology      Relevant Medications   metFORMIN (GLUCOPHAGE) 1000 MG tablet   losartan (COZAAR) 50 MG tablet   atorvastatin (LIPITOR) 10 MG tablet   Other Relevant Orders   Lipid panel (Completed)   CMP14+EGFR (Completed)     Other   Hypertriglyceridemia (Chronic)    Continue current meds and dietary changes      Relevant Medications   losartan (COZAAR) 50 MG tablet   gemfibrozil (LOPID) 600 MG tablet   atorvastatin (LIPITOR) 10 MG tablet   Other Relevant Orders   Lipid panel (Completed)   CMP14+EGFR (Completed)   Obesity, Class II, BMI 35-39.9    Improved with ozempic and metformin      Tension headache    Advised  Muscle relaxer and hydration Also to eat smaller more frequent meals      Relevant Medications   cyclobenzaprine (FLEXERIL) 10 MG tablet   Muscle spasm - Primary    Advised flexeril and rest       Relevant Medications   cyclobenzaprine (FLEXERIL) 10 MG tablet    Other Visit Diagnoses    Encounter for medication refill          Meds ordered this encounter  Medications  . cyclobenzaprine (FLEXERIL) 10 MG tablet    Sig: TAKE ONE TABLET BY MOUTH AT BEDTIME AS NEEDED FOR MUSCLE SPASM    Dispense:  60 tablet    Refill:  1  . metFORMIN (GLUCOPHAGE) 1000 MG tablet    Sig: Take 1 tablet (1,000 mg total) by mouth 2 (two) times daily.    Dispense:  180 tablet    Refill:  3  . losartan (COZAAR) 50 MG tablet    Sig: TAKE 1 TABLET BY MOUTH ONCE DAILY    Dispense:  90 tablet    Refill:   3    Please consider 90 day supplies to promote better adherence  . gemfibrozil (LOPID) 600 MG tablet    Sig: Take 1 tablet (600 mg total) by mouth 2 (two) times daily with a meal.    Dispense:  60 tablet    Refill:  0    Please consider 90 day supplies to promote better adherence  . atorvastatin (LIPITOR) 10 MG tablet    Sig: Take 1 tablet (10 mg total) by mouth daily.    Dispense:  90 tablet    Refill:  3    Follow-up: Return in about 3 months (around 10/05/2018) for diabetes and hypertension .    Forrest Moron, MD

## 2018-07-07 ENCOUNTER — Other Ambulatory Visit: Payer: Self-pay | Admitting: Internal Medicine

## 2018-07-07 LAB — CMP14+EGFR
ALT: 25 IU/L (ref 0–32)
AST: 19 IU/L (ref 0–40)
Albumin/Globulin Ratio: 1.5 (ref 1.2–2.2)
Albumin: 4.8 g/dL (ref 3.5–5.5)
Alkaline Phosphatase: 33 IU/L — ABNORMAL LOW (ref 39–117)
BUN/Creatinine Ratio: 23 (ref 9–23)
BUN: 11 mg/dL (ref 6–24)
Bilirubin Total: 0.3 mg/dL (ref 0.0–1.2)
CO2: 22 mmol/L (ref 20–29)
Calcium: 10.2 mg/dL (ref 8.7–10.2)
Chloride: 95 mmol/L — ABNORMAL LOW (ref 96–106)
Creatinine, Ser: 0.48 mg/dL — ABNORMAL LOW (ref 0.57–1.00)
GFR calc Af Amer: 134 mL/min/{1.73_m2} (ref >59)
GFR calc non Af Amer: 116 mL/min/{1.73_m2} (ref >59)
Globulin, Total: 3.1 g/dL (ref 1.5–4.5)
Glucose: 112 mg/dL — ABNORMAL HIGH (ref 65–99)
Potassium: 4.3 mmol/L (ref 3.5–5.2)
SODIUM: 137 mmol/L (ref 134–144)
Total Protein: 7.9 g/dL (ref 6.0–8.5)

## 2018-07-07 LAB — LIPID PANEL
Chol/HDL Ratio: 6.5 ratio — ABNORMAL HIGH (ref 0.0–4.4)
Cholesterol, Total: 183 mg/dL (ref 100–199)
HDL: 28 mg/dL — ABNORMAL LOW (ref >39)
LDL Calculated: 111 mg/dL — ABNORMAL HIGH (ref 0–99)
Triglycerides: 222 mg/dL — ABNORMAL HIGH (ref 0–149)
VLDL Cholesterol Cal: 44 mg/dL — ABNORMAL HIGH (ref 5–40)

## 2018-07-07 MED ORDER — INSULIN ASPART 100 UNIT/ML FLEXPEN: PEN_INJECTOR | SUBCUTANEOUS | 3 refills | Status: DC

## 2018-07-07 NOTE — Telephone Encounter (Signed)
MEDICATION: insulin aspart (NOVOLOG FLEXPEN) 100 UNIT/ML FlexPen  PHARMACY:  Contacted. [facility] [place]  IS THIS A 90 DAY SUPPLY :   IS PATIENT OUT OF MEDICATION: Yes  IF NOT; HOW MUCH IS LEFT:   LAST APPOINTMENT DATE: @12 /13/2019  NEXT APPOINTMENT DATE:@Visit  date not found  DO WE HAVE YOUR PERMISSION TO LEAVE A DETAILED MESSAGE:  OTHER COMMENTS:    **Let patient know to contact pharmacy at the end of the day to make sure medication is ready. **  ** Please notify patient to allow 48-72 hours to process**  **Encourage patient to contact the pharmacy for refills or they can [facility] refills [facility]**

## 2018-07-07 NOTE — Telephone Encounter (Signed)
RX sent

## 2018-07-08 NOTE — Assessment & Plan Note (Signed)
Continue current meds and dietary changes

## 2018-07-17 ENCOUNTER — Telehealth: Payer: Self-pay | Admitting: Internal Medicine

## 2018-07-17 ENCOUNTER — Other Ambulatory Visit: Payer: Self-pay

## 2018-07-17 MED ORDER — INSULIN GLARGINE 100 UNIT/ML SOLOSTAR PEN: PEN_INJECTOR | SUBCUTANEOUS | 3 refills | Status: DC

## 2018-07-17 NOTE — Telephone Encounter (Signed)
MEDICATION: Insulin Glargine (LANTUS SOLOSTAR) 100 UNIT/ML Solostar Pen  PHARMACY:  [facility] [address] [place]  IS THIS A 90 DAY SUPPLY : No  IS PATIENT OUT OF MEDICATION: No  IF NOT; HOW MUCH IS LEFT: almost out-4 days left  LAST APPOINTMENT DATE: @12 /13/2019  NEXT APPOINTMENT DATE:@Visit  date not found  DO WE HAVE YOUR PERMISSION TO LEAVE A DETAILED MESSAGE:  OTHER COMMENTS: Patient's Pharmacy told patient she is out of refills and needs a new Rx   **Let patient know to contact pharmacy at the end of the day to make sure medication is ready. **  ** Please notify patient to allow 48-72 hours to process**  **Encourage patient to contact the pharmacy for refills or they can [facility] ref[facility]**

## 2018-07-17 NOTE — Telephone Encounter (Signed)
rx sent

## 2018-08-09 ENCOUNTER — Other Ambulatory Visit: Payer: Self-pay | Admitting: Family Medicine

## 2018-08-09 DIAGNOSIS — E781 Pure hyperglyceridemia: Secondary | ICD-10-CM

## 2018-08-09 NOTE — Telephone Encounter (Signed)
PCP is aware of lab results and will follow up at next appointment in March Requested Prescriptions  Pending Prescriptions Disp Refills  . gemfibrozil (LOPID) 600 MG tablet [Pharmacy Med Name: Gemfibrozil 600 MG Oral Tablet] 60 tablet 1    Sig: TAKE 1 TABLET BY MOUTH TWICE DAILY WITH MEALS     Cardiovascular:  Antilipid - Fibric Acid Derivatives Failed - 08/09/2018  1:13 PM      Failed - LDL in normal range and within 360 days    LDL Calculated  Date Value Ref Range Status  07/06/2018 111 (H) 0 - 99 mg/dL Final         Failed - HDL in normal range and within 360 days    HDL  Date Value Ref Range Status  07/06/2018 28 (L) >39 mg/dL Final         Failed - Triglycerides in normal range and within 360 days    Triglycerides  Date Value Ref Range Status  07/06/2018 222 (H) 0 - 149 mg/dL Final         Failed - Cr in normal range and within 180 days    Creat  Date Value Ref Range Status  10/28/2015 0.41 (L) 0.50 - 1.10 mg/dL Final   Creatinine, Ser  Date Value Ref Range Status  07/06/2018 0.48 (L) 0.57 - 1.00 mg/dL Final         Passed - Total Cholesterol in normal range and within 360 days    Cholesterol, Total  Date Value Ref Range Status  07/06/2018 183 100 - 199 mg/dL Final         Passed - ALT in normal range and within 180 days    ALT  Date Value Ref Range Status  07/06/2018 25 0 - 32 IU/L Final         Passed - AST in normal range and within 180 days    AST  Date Value Ref Range Status  07/06/2018 19 0 - 40 IU/L Final         Passed - eGFR in normal range and within 180 days    GFR, Est African American  Date Value Ref Range Status  11/14/2014 >89 mL/min Final   GFR calc Af Amer  Date Value Ref Range Status  07/06/2018 134 >59 mL/min/1.73 Final   GFR, Est Non African American  Date Value Ref Range Status  11/14/2014 >89 mL/min Final    Comment:      The estimated GFR is a calculation valid for adults (>=80 years old) that uses the CKD-EPI algorithm  to adjust for age and sex. It is   not to be used for children, pregnant women, hospitalized patients,    patients on dialysis, or with rapidly changing kidney function. According to the NKDEP, eGFR >89 is normal, 60-89 shows mild impairment, 30-59 shows moderate impairment, 15-29 shows severe impairment and <15 is ESRD.      GFR calc non Af Amer  Date Value Ref Range Status  07/06/2018 116 >59 mL/min/1.73 Final   GFR  Date Value Ref Range Status  02/16/2013 143.30 >60.00 mL/min Final         Passed - Valid encounter within last 12 months    Recent Outpatient Visits          1 month ago Muscle spasm   [facility] at [place], [place]   9 months ago Annual physical exam   [facility] at [facility], [place], [place]  1 year ago Benign essential HTN   Primary Care at [facility], [place], [place]   2 years ago Benign essential HTN   Primary Care at [facility], [place], [place]   3 years ago Laboratory examination ordered as part of a routine general medical examination   [facility] at [place], [place]      Future Appointments            In 1 month Forrest Moron, MD Primary [facility] at [place], [facility]

## 2018-09-11 ENCOUNTER — Telehealth: Payer: Self-pay | Admitting: Family Medicine

## 2018-09-11 NOTE — Telephone Encounter (Signed)
LVM for pt to call office and reschedule. Due to Dr. Creta Levin being out of the office, pt will need to be reschedule. When pt calls back, please reschedule appt from 10/11/18 at their convenience. Thank you!

## 2018-10-05 ENCOUNTER — Ambulatory Visit: Payer: BLUE CROSS/BLUE SHIELD | Admitting: Family Medicine

## 2018-10-11 ENCOUNTER — Ambulatory Visit: Payer: BLUE CROSS/BLUE SHIELD | Admitting: Family Medicine

## 2018-10-18 ENCOUNTER — Other Ambulatory Visit: Payer: Self-pay | Admitting: Family Medicine

## 2018-10-18 DIAGNOSIS — E781 Pure hyperglyceridemia: Secondary | ICD-10-CM

## 2018-11-02 ENCOUNTER — Other Ambulatory Visit: Payer: Self-pay | Admitting: Internal Medicine

## 2018-11-09 ENCOUNTER — Other Ambulatory Visit: Payer: Self-pay

## 2018-11-09 ENCOUNTER — Encounter: Payer: Self-pay | Admitting: Family Medicine

## 2018-11-09 ENCOUNTER — Telehealth (INDEPENDENT_AMBULATORY_CARE_PROVIDER_SITE_OTHER): Payer: BLUE CROSS/BLUE SHIELD | Admitting: Family Medicine

## 2018-11-09 VITALS — Ht 66.0 in | Wt 198.0 lb

## 2018-11-09 DIAGNOSIS — E781 Pure hyperglyceridemia: Secondary | ICD-10-CM

## 2018-11-09 DIAGNOSIS — E6609 Other obesity due to excess calories: Secondary | ICD-10-CM

## 2018-11-09 DIAGNOSIS — Z794 Long term (current) use of insulin: Secondary | ICD-10-CM

## 2018-11-09 DIAGNOSIS — Z6831 Body mass index (BMI) 31.0-31.9, adult: Secondary | ICD-10-CM

## 2018-11-09 DIAGNOSIS — E1165 Type 2 diabetes mellitus with hyperglycemia: Secondary | ICD-10-CM | POA: Diagnosis not present

## 2018-11-09 DIAGNOSIS — K219 Gastro-esophageal reflux disease without esophagitis: Secondary | ICD-10-CM

## 2018-11-09 DIAGNOSIS — I10 Essential (primary) hypertension: Secondary | ICD-10-CM

## 2018-11-09 DIAGNOSIS — R5383 Other fatigue: Secondary | ICD-10-CM

## 2018-11-09 MED ORDER — INSULIN GLARGINE 100 UNIT/ML SOLOSTAR PEN: PEN_INJECTOR | SUBCUTANEOUS | 3 refills | Status: DC

## 2018-11-09 MED ORDER — INSULIN ASPART 100 UNIT/ML FLEXPEN: PEN_INJECTOR | SUBCUTANEOUS | 3 refills | Status: DC

## 2018-11-09 MED ORDER — GEMFIBROZIL 600 MG PO TABS: 600.0000 mg | ORAL_TABLET | Freq: Two times a day (BID) | ORAL | 1 refills | Status: DC

## 2018-11-09 MED ORDER — ONETOUCH ULTRASOFT LANCETS MISC: 12 refills | Status: AC

## 2018-11-09 MED ORDER — INSULIN PEN NEEDLE 32G X 4 MM MISC: 0 refills | Status: DC

## 2018-11-09 MED ORDER — METFORMIN HCL 1000 MG PO TABS: 1000.0000 mg | ORAL_TABLET | Freq: Two times a day (BID) | ORAL | 3 refills | Status: DC

## 2018-11-09 MED ORDER — OMEPRAZOLE 20 MG PO CPDR: 20.0000 mg | DELAYED_RELEASE_CAPSULE | Freq: Every day | ORAL | 3 refills | Status: DC

## 2018-11-09 MED ORDER — SEMAGLUTIDE(0.25 OR 0.5MG/DOS) 2 MG/1.5ML ~~LOC~~ SOPN: 0.5000 mg | PEN_INJECTOR | SUBCUTANEOUS | 5 refills | Status: DC

## 2018-11-09 MED ORDER — LOSARTAN POTASSIUM 50 MG PO TABS: ORAL_TABLET | ORAL | 3 refills | Status: DC

## 2018-11-09 NOTE — Patient Instructions (Signed)
° ° ° °  If you have lab work done today you will be contacted with your lab results within the next 2 weeks.  If you have not heard from us then please contact us. The fastest way to get your results is to register for My Chart. ° ° °IF you received an x-ray today, you will receive an invoice from [facility]. Please contact [facility] at [phone] with questions or concerns regarding your invoice.  ° °IF you received labwork today, you will receive an invoice from [facility]. Please contact [facility] at [phone] with questions or concerns regarding your invoice.  ° °Our billing staff will not be able to assist you with questions regarding bills from these companies. ° °You will be contacted with the lab results as soon as they are available. The fastest way to get your results is to activate your My Chart account. Instructions are located on the last page of this paperwork. If you have not heard from us regarding the results in 2 weeks, please contact this office. °  ° ° ° °

## 2018-11-09 NOTE — Progress Notes (Signed)
CC: 3 month f/u diabetes and hypertension.  Pt requesting refills on , atorvastatin, gemfibrozil, novolog flexpen, lantus solostar, losartan, pen needles, lancets, losartan, ozempic.  No travel outside [place] or [place] in the past 3 weeks.

## 2018-11-09 NOTE — Progress Notes (Signed)
Telemedicine Encounter- SOAP NOTE Established Patient  This telephone encounter was conducted with the patient's (or proxy's) verbal consent via audio telecommunications: yes/no: Yes Patient was instructed to have this encounter in a suitably private space; and to only have persons present to whom they give permission to participate. In addition, patient identity was confirmed by use of name plus two identifiers (DOB and address).  I discussed the limitations, risks, security and privacy concerns of performing an evaluation and management service by telephone and the availability of in person appointments. I also discussed with the patient that there may be a patient responsible charge related to this service. The patient expressed understanding and agreed to proceed.  I spent a total of TIME; 0 MIN TO 60 MIN: 25 minutes talking with the patient or their proxy.  CC: diabetes, weight loss Subjective   Carolyn Butler is a 48 y.o. established patient. Telephone visit today for  HPI Diabetes Type 2 on insulin  She is taking gemfibrozil for her triglycerides She is not taking atorvastatin She is losing weight and watching what she eats She is taking novolog 16 units BID She is doing lantus 30 units at bedtime Her glucose this morning was 126   Obesity Body mass index is 31.96 kg/m.  Wt Readings from Last 3 Encounters:  11/09/18 198 lb (89.8 kg)  07/06/18 219 lb 3.2 oz (99.4 kg)  05/30/18 222 lb (100.7 kg)   She is exercising by walking and is doing about 5000 steps a day She is also careful about what she eats  The ozempic is helping with her hunger, snacking and cravings.  GERD She has heart burn  She thinks it is because she stopped taking ranitidine She states that she saw a drug recall She has been taking tums as needed  Hypertension She does not own a bp cuff She is complaint with her bp meds She takes losartan She denies any medication side effects Lab Results   Component Value Date   CREATININE 0.48 (L) 07/06/2018   BP Readings from Last 3 Encounters:  07/06/18 132/83  05/30/18 120/82  11/15/17 118/68      Patient Active Problem List   Diagnosis Date Noted  . Tension headache 07/06/2018  . Muscle spasm 07/06/2018  . Controlled diabetes mellitus with long-term current use of insulin ([facility]) 08/14/2015  . Pseudohyponatremia 12/14/2012  . Hypertriglyceridemia 12/14/2012  . Benign essential HTN 11/20/2011  . Obesity, Class II, BMI 35-39.9 11/20/2011  . Sleep disturbance 11/20/2011    Past Medical History:  Diagnosis Date  . Diabetes mellitus without complication ([facility])   . Hypertension     Current Outpatient Medications  Medication Sig Dispense Refill  . aspirin 81 MG tablet Take 81 mg by mouth daily.    . cyclobenzaprine (FLEXERIL) 10 MG tablet TAKE ONE TABLET BY MOUTH AT BEDTIME AS NEEDED FOR MUSCLE SPASM 60 tablet 1  . gemfibrozil (LOPID) 600 MG tablet Take 1 tablet (600 mg total) by mouth 2 (two) times daily with a meal. 180 tablet 1  . glucose blood (ONE TOUCH ULTRA TEST) test strip Use to test blood sugar 3 times daily as instructed. 100 each 11  . insulin aspart (NOVOLOG FLEXPEN) 100 UNIT/ML FlexPen INJECT 15-16 UNITS SUBCUTANEOUSLY  THREE TIMES DAILY 30 mL 3  . Insulin Glargine (LANTUS SOLOSTAR) 100 UNIT/ML Solostar Pen INJECT 30 UNITS INTO THE SKIN EACH NIGHT AT BEDTIME 30 mL 3  . Insulin Pen Needle (BD PEN NEEDLE NANO U/F)  32G X 4 MM MISC USE 1 PEN NEEDLE TO INJECT INSULIN  4 TIMES DAILY 100 each 0  . losartan (COZAAR) 50 MG tablet TAKE 1 TABLET BY MOUTH ONCE DAILY 90 tablet 3  . metFORMIN (GLUCOPHAGE) 1000 MG tablet Take 1 tablet (1,000 mg total) by mouth 2 (two) times daily. 180 tablet 3  . Multiple Vitamin (MULTIVITAMIN) tablet Take 1 tablet by mouth daily.    Marland Kitchen RELION PEN NEEDLES 32G X 4 MM MISC USE TO INJECT INSULIN 4 TIMES DAILY 150 each 5  . Semaglutide,0.25 or 0.5MG/DOS, (OZEMPIC, 0.25 OR 0.5 MG/DOSE,) 2 MG/1.5ML SOPN  Inject 0.5 mg into the skin once a week. 2 pen 5  . Lancets (ONETOUCH ULTRASOFT) lancets Use as instructed. Test blood glucose bid. E11.9 100 each 12  . Omega-3 Fatty Acids (OMEGA 3 PO) Take by mouth 2 (two) times daily with a meal.    . omeprazole (PRILOSEC) 20 MG capsule Take 1 capsule (20 mg total) by mouth daily. 30 capsule 3  . VITAMIN E PO Take by mouth daily.     No current facility-administered medications for this visit.     Allergies  Allergen Reactions  . Doxycycline     Social History   Socioeconomic History  . Marital status: Married    Spouse name: Fahmida Jurich  . Number of children: 0  . Years of education: 12+  . Highest education level: Not on file  Occupational History  . Occupation: unemployed    Comment: Visual merchandiser  . Financial resource strain: Not on file  . Food insecurity:    Worry: Not on file    Inability: Not on file  . Transportation needs:    Medical: Not on file    Non-medical: Not on file  Tobacco Use  . Smoking status: Never Smoker  . Smokeless tobacco: Never Used  Substance and Sexual Activity  . Alcohol use: No    Comment: twice a year mixed drink or red wine  . Drug use: No  . Sexual activity: Yes  Lifestyle  . Physical activity:    Days per week: Not on file    Minutes per session: Not on file  . Stress: Not on file  Relationships  . Social connections:    Talks on phone: Not on file    Gets together: Not on file    Attends religious service: Not on file    Active member of club or organization: Not on file    Attends meetings of clubs or organizations: Not on file    Relationship status: Not on file  . Intimate partner violence:    Fear of current or ex partner: Not on file    Emotionally abused: Not on file    Physically abused: Not on file    Forced sexual activity: Not on file  Other Topics Concern  . Not on file  Social History Narrative   Lives with her husband.   Laid-off from job at [employer]  (2014), and hasn't returned to work.    ROS  Objective   Vitals as reported by the patient: Today's Vitals   11/09/18 1119  Weight: 198 lb (89.8 kg)  Height: 5' 6"  (1.676 m)    Diagnoses and all orders for this visit:  Controlled type 2 diabetes mellitus with hyperglycemia, with long-term current use of insulin ([facility]) -  Patient has a good understanding of her diabetes She declined statin which is standard of care but her  LDL is 111. She is on ARB  She is compliant with meds  -     CMP14+EGFR; Future -     Hemoglobin A1c; Future -     metFORMIN (GLUCOPHAGE) 1000 MG tablet; Take 1 tablet (1,000 mg total) by mouth 2 (two) times daily.  Hypertriglyceridemia- pt on fenofibrate She is losing weight  Expect to see improvement -     Lipid panel; Future -     gemfibrozil (LOPID) 600 MG tablet; Take 1 tablet (600 mg total) by mouth 2 (two) times daily with a meal.  Other fatigue- will assess -     CBC with Differential; Future  Gastroesophageal reflux disease without esophagitis- advised omeprazole since ranitidine recalled  Benign essential HTN- bp at goal  With weight loss anticipate good results -     losartan (COZAAR) 50 MG tablet; TAKE 1 TABLET BY MOUTH ONCE DAILY  Class 1 obesity due to excess calories with serious comorbidity and body mass index (BMI) of 31.0 to 31.9 in adult- improved   Other orders -     Insulin Glargine (LANTUS SOLOSTAR) 100 UNIT/ML Solostar Pen; INJECT 30 UNITS INTO THE SKIN EACH NIGHT AT BEDTIME -     insulin aspart (NOVOLOG FLEXPEN) 100 UNIT/ML FlexPen; INJECT 15-16 UNITS SUBCUTANEOUSLY  THREE TIMES DAILY -     Semaglutide,0.25 or 0.5MG/DOS, (OZEMPIC, 0.25 OR 0.5 MG/DOSE,) 2 MG/1.5ML SOPN; Inject 0.5 mg into the skin once a week. -     Insulin Pen Needle (BD PEN NEEDLE NANO U/F) 32G X 4 MM MISC; USE 1 PEN NEEDLE TO INJECT INSULIN  4 TIMES DAILY -     Lancets (ONETOUCH ULTRASOFT) lancets; Use as instructed. Test blood glucose bid. E11.9 -      omeprazole (PRILOSEC) 20 MG capsule; Take 1 capsule (20 mg total) by mouth daily.     I discussed the assessment and treatment plan with the patient. The patient was provided an opportunity to ask questions and all were answered. The patient agreed with the plan and demonstrated an understanding of the instructions.   The patient was advised to call back or seek an in-person evaluation if the symptoms worsen or if the condition fails to improve as anticipated.  I provided 25 minutes of non-face-to-face time during this encounter.  Forrest Moron, MD  Primary Care at [facility]

## 2019-01-22 ENCOUNTER — Telehealth: Payer: Self-pay

## 2019-01-22 ENCOUNTER — Telehealth (INDEPENDENT_AMBULATORY_CARE_PROVIDER_SITE_OTHER): Payer: BC Managed Care – PPO | Admitting: Family Medicine

## 2019-01-22 VITALS — BP 176/99 | HR 93 | Ht 66.0 in | Wt 260.0 lb

## 2019-01-22 DIAGNOSIS — R519 Headache, unspecified: Secondary | ICD-10-CM

## 2019-01-22 DIAGNOSIS — Z20822 Contact with and (suspected) exposure to covid-19: Secondary | ICD-10-CM

## 2019-01-22 DIAGNOSIS — E1165 Type 2 diabetes mellitus with hyperglycemia: Secondary | ICD-10-CM | POA: Diagnosis not present

## 2019-01-22 DIAGNOSIS — R05 Cough: Secondary | ICD-10-CM | POA: Diagnosis not present

## 2019-01-22 DIAGNOSIS — Z794 Long term (current) use of insulin: Secondary | ICD-10-CM

## 2019-01-22 DIAGNOSIS — I10 Essential (primary) hypertension: Secondary | ICD-10-CM

## 2019-01-22 DIAGNOSIS — R51 Headache: Secondary | ICD-10-CM

## 2019-01-22 DIAGNOSIS — R059 Cough, unspecified: Secondary | ICD-10-CM

## 2019-01-22 NOTE — Telephone Encounter (Signed)
Spoke with patient, scheduled her for tomorrow at 12:30 pm at [facility].  Testing protocol reviewed with patient.

## 2019-01-22 NOTE — Addendum Note (Signed)
Addended by: Denyce Robert on: 01/22/2019 04:03 PM   Modules accepted: Orders

## 2019-01-22 NOTE — Telephone Encounter (Signed)
Please schedule for COVID- 19 Screening due to cough and headache

## 2019-01-22 NOTE — Progress Notes (Signed)
CC: Patient had headaches and a cough last week. Those symptoms have when away, and patient thinks it may have been allergies. Her husband is concern and would like her to be tested. Also, she would like a refill on flexeril due to muscle spasms in her back. Patient also stated her glucose is 159.

## 2019-01-22 NOTE — Progress Notes (Signed)
Virtual Visit via Telephone Note  I connected with Carolyn BaltimoreLina G Heckendorn on 01/22/19 at 2:13 PM by telephone and verified that I am speaking with the correct person using two identifiers.   I discussed the limitations, risks, security and privacy concerns of performing an evaluation and management service by telephone and the availability of in person appointments. I also discussed with the patient that there may be a patient responsible charge related to this service. The patient expressed understanding and agreed to proceed, consent obtained  Chief complaint: Cough/headache last week.  History of Present Illness: Carolyn Butler is a 48 y.o. female  PCP:  Doristine BosworthStallings, Zoe A, MD   Presents today for:  Cough/headache/COVID test concern: Headaches for years, history of migraines. No recent migraine symptoms - more light HA - comes and goes. 1-2 times per week. HA about 1x/month around times of menses in past, more frequent on Ozempic since December.   Cough last week for one day - few hours. . Possible allergies or something she ate. Improved now. No dyspnea.  tmax 97, no fever.  No change in taste/smell.  No n/v/d.  No known sick contacts with individuals who have or being tested for Covid 19. Would like to be tested fo Covid 19 - husband still working, he is concerned that she may have been infected.    On insulin for DM2, no symptomatic lows. Reading 159 today after eating - slight HA at that time. Wondering if blood sugar up when has HA.  Managed by endocrinology, did have some lab work ordered at last visit with primary care provider.  Home BP 176/99 today.   BP Readings from Last 3 Encounters:  01/22/19 (!) 176/99  07/06/18 132/83  05/30/18 120/82  on losartan 50mg  qd for HTN - no missed doses.  No chest pains, no focal weakness, no slurred speech.  Recheck BP on home machine during call - 186/113. Wrist machine. Has not been using to check BP recently besides today.      Patient Active Problem List   Diagnosis Date Noted  . Tension headache 07/06/2018  . Muscle spasm 07/06/2018  . Controlled diabetes mellitus with long-term current use of insulin ([facility]) 08/14/2015  . Pseudohyponatremia 12/14/2012  . Hypertriglyceridemia 12/14/2012  . Benign essential HTN 11/20/2011  . Obesity, Class II, BMI 35-39.9 11/20/2011  . Sleep disturbance 11/20/2011   Past Medical History:  Diagnosis Date  . Diabetes mellitus without complication ([facility])   . Hypertension    Past Surgical History:  Procedure Laterality Date  . surgery on right knee     Allergies  Allergen Reactions  . Doxycycline    Prior to Admission medications   Medication Sig Start Date End Date Taking? Authorizing Provider  aspirin 81 MG tablet Take 81 mg by mouth daily.   Yes [provider]  cyclobenzaprine (FLEXERIL) 10 MG tablet TAKE ONE TABLET BY MOUTH AT BEDTIME AS NEEDED FOR MUSCLE SPASM 07/06/18  Yes Collie SiadStallings, Zoe A, MD  gemfibrozil (LOPID) 600 MG tablet Take 1 tablet (600 mg total) by mouth 2 (two) times daily with a meal. 11/09/18  Yes Stallings, Zoe A, MD  glucose blood (ONE TOUCH ULTRA TEST) test strip Use to test blood sugar 3 times daily as instructed. 11/26/13  Yes Carlus PavlovGherghe, Cristina, MD  insulin aspart (NOVOLOG FLEXPEN) 100 UNIT/ML FlexPen INJECT 15-16 UNITS SUBCUTANEOUSLY  THREE TIMES DAILY 11/09/18  Yes Collie SiadStallings, Zoe A, MD  Insulin Glargine (LANTUS SOLOSTAR) 100 UNIT/ML Solostar Pen INJECT 30  UNITS INTO THE SKIN EACH NIGHT AT BEDTIME 11/09/18  Yes Stallings, Zoe A, MD  Insulin Pen Needle (BD PEN NEEDLE NANO U/F) 32G X 4 MM MISC USE 1 PEN NEEDLE TO INJECT INSULIN  4 TIMES DAILY 11/09/18  Yes Delia Chimes A, MD  Lancets (ONETOUCH ULTRASOFT) lancets Use as instructed. Test blood glucose bid. E11.9 11/09/18  Yes Stallings, Zoe A, MD  losartan (COZAAR) 50 MG tablet TAKE 1 TABLET BY MOUTH ONCE DAILY 11/09/18  Yes Stallings, Zoe A, MD  metFORMIN (GLUCOPHAGE) 1000 MG tablet Take 1 tablet  (1,000 mg total) by mouth 2 (two) times daily. 11/09/18  Yes Forrest Moron, MD  Multiple Vitamin (MULTIVITAMIN) tablet Take 1 tablet by mouth daily.   Yes [provider]  Omega-3 Fatty Acids (OMEGA 3 PO) Take by mouth 2 (two) times daily with a meal.   Yes [provider]  omeprazole (PRILOSEC) 20 MG capsule Take 1 capsule (20 mg total) by mouth daily. 11/09/18  Yes Stallings, Zoe A, MD  RELION PEN NEEDLES 32G X 4 MM MISC USE TO INJECT INSULIN 4 TIMES DAILY 11/07/17  Yes Philemon Kingdom, MD  Semaglutide,0.25 or 0.5MG /DOS, (OZEMPIC, 0.25 OR 0.5 MG/DOSE,) 2 MG/1.5ML SOPN Inject 0.5 mg into the skin once a week. 11/09/18  Yes Delia Chimes A, MD  VITAMIN E PO Take by mouth daily.   Yes [provider]   Social History   Socioeconomic History  . Marital status: Married    Spouse name: Avalin Briley  . Number of children: 0  . Years of education: 12+  . Highest education level: Not on file  Occupational History  . Occupation: unemployed    Comment: Visual merchandiser  . Financial resource strain: Not on file  . Food insecurity    Worry: Not on file    Inability: Not on file  . Transportation needs    Medical: Not on file    Non-medical: Not on file  Tobacco Use  . Smoking status: Never Smoker  . Smokeless tobacco: Never Used  Substance and Sexual Activity  . Alcohol use: No    Comment: twice a year mixed drink or red wine  . Drug use: No  . Sexual activity: Yes  Lifestyle  . Physical activity    Days per week: Not on file    Minutes per session: Not on file  . Stress: Not on file  Relationships  . Social Herbalist on phone: Not on file    Gets together: Not on file    Attends religious service: Not on file    Active member of club or organization: Not on file    Attends meetings of clubs or organizations: Not on file    Relationship status: Not on file  . Intimate partner violence    Fear of current or ex partner: Not on file     Emotionally abused: Not on file    Physically abused: Not on file    Forced sexual activity: Not on file  Other Topics Concern  . Not on file  Social History Narrative   Lives with her husband.   Laid-off from job at [employer] (2014), and hasn't returned to work.    Observations/Objective: Vitals:   01/22/19 1357  BP: (!) 176/99  Pulse: 93  Weight: 260 lb (117.9 kg)  Height: 5\' 6"  (1.676 m)  home reported vitals.   No distress on phone, appropriate responses.  Understanding of plan expressed  and all questions answered.   Assessment and Plan: Cough - Plan:  Nonintractable episodic headache, unspecified headache type - Plan:  - cough only few hours last week, asx at present. Intermittent HA for some time. No known Covid exposure. No known change in HA recently. Did agree to Covid testing given continued intermittent HA, hist of IDDM, and prior cough, but unlikely.   - elevated BP at home. May be contributor to HA vs false elevated readings - will need to see in office for eval. Will schedule appointment for tomorrow with her primary care provider.    Essential hypertension - Plan:    - as above. No recent in office eval, scheduled 1 day follow up appt, with overnight ER precautions given.    Controlled type 2 diabetes mellitus with hyperglycemia, with long-term current use of insulin ([facility]) - Plan:   - no recent blood work(ordered in April, not yet done) last PCP eval in April, but is followed by endocrinology.lab order in place from April.    Follow Up Instructions: 1 day - Dr. Creta LevinStallings.    I discussed the assessment and treatment plan with the patient. The patient was provided an opportunity to ask questions and all were answered. The patient agreed with the plan and demonstrated an understanding of the instructions.   The patient was advised to call back or seek an in-person evaluation if the symptoms worsen or if the condition fails to improve as anticipated.  I  provided  18 minutes of non-face-to-face time during this encounter.  Signed,   Meredith StaggersJeffrey Jamelle Noy, MD Primary Care at [facility].  01/22/19

## 2019-01-23 ENCOUNTER — Encounter: Payer: Self-pay | Admitting: Family Medicine

## 2019-01-23 ENCOUNTER — Other Ambulatory Visit: Payer: Self-pay

## 2019-01-23 ENCOUNTER — Other Ambulatory Visit: Payer: BLUE CROSS/BLUE SHIELD

## 2019-01-23 ENCOUNTER — Ambulatory Visit (INDEPENDENT_AMBULATORY_CARE_PROVIDER_SITE_OTHER): Payer: BC Managed Care – PPO | Admitting: Family Medicine

## 2019-01-23 ENCOUNTER — Telehealth: Payer: Self-pay | Admitting: Family Medicine

## 2019-01-23 VITALS — BP 130/81 | HR 94 | Temp 98.4°F | Resp 16 | Ht 66.0 in | Wt 205.4 lb

## 2019-01-23 DIAGNOSIS — E1165 Type 2 diabetes mellitus with hyperglycemia: Secondary | ICD-10-CM

## 2019-01-23 DIAGNOSIS — E781 Pure hyperglyceridemia: Secondary | ICD-10-CM

## 2019-01-23 DIAGNOSIS — R5383 Other fatigue: Secondary | ICD-10-CM

## 2019-01-23 DIAGNOSIS — R519 Headache, unspecified: Secondary | ICD-10-CM

## 2019-01-23 DIAGNOSIS — R51 Headache: Secondary | ICD-10-CM | POA: Diagnosis not present

## 2019-01-23 DIAGNOSIS — Z794 Long term (current) use of insulin: Secondary | ICD-10-CM | POA: Diagnosis not present

## 2019-01-23 MED ORDER — KETOROLAC TROMETHAMINE 30 MG/ML IJ SOLN
30.0000 mg | Freq: Once | INTRAMUSCULAR | Status: AC
  Administered 2019-01-23: 30 mg via INTRAMUSCULAR

## 2019-01-23 NOTE — Progress Notes (Signed)
Established Patient Office Visit  Subjective:  Patient ID: Carolyn Butler, female    DOB: [birth year]  Age: 48 y.o. MRN: [id number]  CC:  Chief Complaint  Patient presents with  . headache and blood pressure    per Nicasio Barlowe    HPI Carolyn Butler presents for   Headache Pt reports that she has been having more headaches lately She reports that she did not eat today or drink her usual coffee She state that her pain is 5/10 over the left eye She states that normally she gets a menstrual headache but this headache is  Patient's last menstrual period was 12/28/2018.   No dizziness  No nausea No photophobia, no phonophobia No aura  Hypertension  She did not take her bp meds She denies chest pain, palpitations, or shortness of breath She did not take her insulin or bp meds as yet because she was fasting for labs BP Readings from Last 3 Encounters:  01/23/19 130/81  01/22/19 (!) 176/99  07/06/18 132/83   At home her bp readings are typically high with a bp cuff at the wrist  Diabetes She is doing lantus 30 units and novolog 16 units with meals tid She reports that yesterday her postprandial was 159 She denies hypoglycemia She is exercising by walking and yoga Wt Readings from Last 3 Encounters:  01/23/19 205 lb 6.4 oz (93.2 kg)  01/22/19 260 lb (117.9 kg)  11/09/18 198 lb (89.8 kg)   She reports that she was 206 # yesterday not 260 #  Past Medical History:  Diagnosis Date  . Diabetes mellitus without complication (HCC)   . Hypertension     Past Surgical History:  Procedure Laterality Date  . surgery on right knee      Family History  Problem Relation Age of Onset  . Diabetes Father   . Heart disease Father   . Hyperlipidemia Father   . Diabetes Paternal Grandmother   . Heart disease Paternal Grandmother   . Diabetes Brother     Social History   Socioeconomic History  . Marital status: Married    Spouse name: Luz BrazenVincent Breit  . Number of children:  0  . Years of education: 12+  . Highest education level: Not on file  Occupational History  . Occupation: unemployed    Comment: Warden/rangerbanker  Social Needs  . Financial resource strain: Not on file  . Food insecurity    Worry: Not on file    Inability: Not on file  . Transportation needs    Medical: Not on file    Non-medical: Not on file  Tobacco Use  . Smoking status: Never Smoker  . Smokeless tobacco: Never Used  Substance and Sexual Activity  . Alcohol use: No    Comment: twice a year mixed drink or red wine  . Drug use: No  . Sexual activity: Yes  Lifestyle  . Physical activity    Days per week: Not on file    Minutes per session: Not on file  . Stress: Not on file  Relationships  . Social Musicianconnections    Talks on phone: Not on file    Gets together: Not on file    Attends religious service: Not on file    Active member of club or organization: Not on file    Attends meetings of clubs or organizations: Not on file    Relationship status: Not on file  . Intimate partner violence    Fear of current  or ex partner: Not on file    Emotionally abused: Not on file    Physically abused: Not on file    Forced sexual activity: Not on file  Other Topics Concern  . Not on file  Social History Narrative   Lives with her husband.   Laid-off from job at [employer] (2014), and hasn't returned to work.    Outpatient Medications Prior to Visit  Medication Sig Dispense Refill  . aspirin 81 MG tablet Take 81 mg by mouth daily.    . cyclobenzaprine (FLEXERIL) 10 MG tablet TAKE ONE TABLET BY MOUTH AT BEDTIME AS NEEDED FOR MUSCLE SPASM 60 tablet 1  . gemfibrozil (LOPID) 600 MG tablet Take 1 tablet (600 mg total) by mouth 2 (two) times daily with a meal. 180 tablet 1  . glucose blood (ONE TOUCH ULTRA TEST) test strip Use to test blood sugar 3 times daily as instructed. 100 each 11  . insulin aspart (NOVOLOG FLEXPEN) 100 UNIT/ML FlexPen INJECT 15-16 UNITS SUBCUTANEOUSLY  THREE TIMES DAILY  30 mL 3  . Insulin Glargine (LANTUS SOLOSTAR) 100 UNIT/ML Solostar Pen INJECT 30 UNITS INTO THE SKIN EACH NIGHT AT BEDTIME 30 mL 3  . Insulin Pen Needle (BD PEN NEEDLE NANO U/F) 32G X 4 MM MISC USE 1 PEN NEEDLE TO INJECT INSULIN  4 TIMES DAILY 100 each 0  . Lancets (ONETOUCH ULTRASOFT) lancets Use as instructed. Test blood glucose bid. E11.9 100 each 12  . losartan (COZAAR) 50 MG tablet TAKE 1 TABLET BY MOUTH ONCE DAILY 90 tablet 3  . metFORMIN (GLUCOPHAGE) 1000 MG tablet Take 1 tablet (1,000 mg total) by mouth 2 (two) times daily. 180 tablet 3  . Multiple Vitamin (MULTIVITAMIN) tablet Take 1 tablet by mouth daily.    . Omega-3 Fatty Acids (OMEGA 3 PO) Take by mouth 2 (two) times daily with a meal.    . omeprazole (PRILOSEC) 20 MG capsule Take 1 capsule (20 mg total) by mouth daily. 30 capsule 3  . RELION PEN NEEDLES 32G X 4 MM MISC USE TO INJECT INSULIN 4 TIMES DAILY 150 each 5  . Semaglutide,0.25 or 0.5MG /DOS, (OZEMPIC, 0.25 OR 0.5 MG/DOSE,) 2 MG/1.5ML SOPN Inject 0.5 mg into the skin once a week. 2 pen 5  . VITAMIN E PO Take by mouth daily.     No facility-administered medications prior to visit.     Allergies  Allergen Reactions  . Doxycycline     ROS Review of Systems    Objective:    Physical Exam  BP 130/81 (BP Location: Right Arm, Patient Position: Sitting, Cuff Size: Normal)   Pulse 94   Temp 98.4 F (36.9 C) (Oral)   Resp 16   Ht 5\' 6"  (1.676 m)   Wt 205 lb 6.4 oz (93.2 kg)   LMP 12/28/2018   SpO2 98%   BMI 33.15 kg/m  Wt Readings from Last 3 Encounters:  01/23/19 205 lb 6.4 oz (93.2 kg)  01/22/19 260 lb (117.9 kg)  11/09/18 198 lb (89.8 kg)   CRANIAL NERVES: CN II: Visual fields are full to confrontation. Fundoscopic exam is normal with sharp discs and no vascular changes. Pupils are round equal and briskly reactive to light. CN III, IV, VI: extraocular movement are normal. No ptosis. CN V: Facial sensation is intact to pinprick in all 3 divisions  bilaterally. Corneal responses are intact.  CN VII: Face is symmetric with normal eye closure and smile. CN VIII: Hearing is normal to rubbing fingers  CN IX, X: Palate elevates symmetrically. Phonation is normal. CN XI: Head turning and shoulder shrug are intact CN XII: Tongue is midline with normal movements and no atrophy.  Physical Exam  Constitutional: Oriented to person, place, and time. Appears well-developed and well-nourished.  HENT:  Head: Normocephalic and atraumatic.  Eyes: Conjunctivae and EOM are normal.  Cardiovascular: Normal rate, regular rhythm, normal heart sounds and intact distal pulses.  No murmur heard. Pulmonary/Chest: Effort normal and breath sounds normal. No stridor. No respiratory distress. Has no wheezes.  Abdomen: nondistended, normoactive bs, nontender, no rebound or guarding Neurological: Is alert and oriented to person, place, and time.  Skin: Skin is warm. Capillary refill takes less than 2 seconds.  Psychiatric: Has a normal mood and affect. Behavior is normal. Judgment and thought content normal.    Health Maintenance Due  Topic Date Due  . FOOT EXAM  03/12/2018  . OPHTHALMOLOGY EXAM  03/12/2018  . HEMOGLOBIN A1C  11/28/2018    There are no preventive care reminders to display for this patient.  Lab Results  Component Value Date   TSH 3.060 10/19/2017   Lab Results  Component Value Date   WBC 10.1 10/19/2017   HGB 12.0 10/19/2017   HCT 34.7 10/19/2017   MCV 83 10/19/2017   PLT 568 (H) 10/19/2017   Lab Results  Component Value Date   NA 137 07/06/2018   K 4.3 07/06/2018   CO2 22 07/06/2018   GLUCOSE 112 (H) 07/06/2018   BUN 11 07/06/2018   CREATININE 0.48 (L) 07/06/2018   BILITOT 0.3 07/06/2018   ALKPHOS 33 (L) 07/06/2018   AST 19 07/06/2018   ALT 25 07/06/2018   PROT 7.9 07/06/2018   ALBUMIN 4.8 07/06/2018   CALCIUM 10.2 07/06/2018   GFR 143.30 02/16/2013   Lab Results  Component Value Date   CHOL 183 07/06/2018   Lab  Results  Component Value Date   HDL 28 (L) 07/06/2018   Lab Results  Component Value Date   LDLCALC 111 (H) 07/06/2018   Lab Results  Component Value Date   TRIG 222 (H) 07/06/2018   Lab Results  Component Value Date   CHOLHDL 6.5 (H) 07/06/2018   Lab Results  Component Value Date   HGBA1C 6.4 (A) 05/30/2018      Assessment & Plan:   Problem List Items Addressed This Visit      Endocrine   Controlled diabetes mellitus with long-term current use of insulin (HCC) (Chronic) Well controlled and not likely the cause of headache     Other   Hypertriglyceridemia (Chronic)  -  Will evaluate     Other Visit Diagnoses    Nonintractable episodic headache, unspecified headache type    -  Primary likely she is having a hunger headache with a menstrual headache and caffeine withdrawal   Relevant Medications   ketorolac (TORADOL) 30 MG/ML injection 30 mg   Other fatigue          Meds ordered this encounter  Medications  . ketorolac (TORADOL) 30 MG/ML injection 30 mg    Follow-up: No follow-ups on file.    Doristine BosworthZoe A Lavere Stork, MD

## 2019-01-23 NOTE — Patient Instructions (Signed)
° ° ° °  If you have lab work done today you will be contacted with your lab results within the next 2 weeks.  If you have not heard from us then please contact us. The fastest way to get your results is to register for My Chart. ° ° °IF you received an x-ray today, you will receive an invoice from [facility]. Please contact [facility] at [phone] with questions or concerns regarding your invoice.  ° °IF you received labwork today, you will receive an invoice from [facility]. Please contact [facility] at [phone] with questions or concerns regarding your invoice.  ° °Our billing staff will not be able to assist you with questions regarding bills from these companies. ° °You will be contacted with the lab results as soon as they are available. The fastest way to get your results is to activate your My Chart account. Instructions are located on the last page of this paperwork. If you have not heard from us regarding the results in 2 weeks, please contact this office. °  ° ° ° °

## 2019-01-23 NOTE — Telephone Encounter (Signed)
01/23/2019 - PATIENT SAW DR. Nolon Rod IN THE OFFICE ON Tuesday (01/23/2019). DR. Nolon Rod HAS REQUESTED SHE RETURN IN 3 MONTHS TO FOLLOW-UP ON HER DIABETES AND HYPERTENSION. I DID NOT SEE HER COME TO CHECK-OUT. I TRIED TO CALL AND SCHEDULE BUT HAD TO LEAVE HER A VOICE MAIL TO RETURN OUR CALL. [place]

## 2019-01-24 LAB — CMP14+EGFR
ALT: 32 IU/L (ref 0–32)
AST: 32 IU/L (ref 0–40)
Albumin/Globulin Ratio: 1.9 (ref 1.2–2.2)
Albumin: 4.8 g/dL (ref 3.8–4.8)
Alkaline Phosphatase: 28 IU/L — ABNORMAL LOW (ref 39–117)
BUN/Creatinine Ratio: 25 — ABNORMAL HIGH (ref 9–23)
BUN: 11 mg/dL (ref 6–24)
Bilirubin Total: 0.3 mg/dL (ref 0.0–1.2)
CO2: 23 mmol/L (ref 20–29)
Calcium: 9.5 mg/dL (ref 8.7–10.2)
Chloride: 98 mmol/L (ref 96–106)
Creatinine, Ser: 0.44 mg/dL — ABNORMAL LOW (ref 0.57–1.00)
GFR calc Af Amer: 138 mL/min/{1.73_m2} (ref >59)
GFR calc non Af Amer: 120 mL/min/{1.73_m2} (ref >59)
Globulin, Total: 2.5 g/dL (ref 1.5–4.5)
Glucose: 94 mg/dL (ref 65–99)
Potassium: 4.2 mmol/L (ref 3.5–5.2)
Sodium: 138 mmol/L (ref 134–144)
Total Protein: 7.3 g/dL (ref 6.0–8.5)

## 2019-01-24 LAB — CBC WITH DIFFERENTIAL/PLATELET
Basophils Absolute: 0.1 10*3/uL (ref 0.0–0.2)
Basos: 1 %
EOS (ABSOLUTE): 0.1 10*3/uL (ref 0.0–0.4)
Eos: 2 %
Hematocrit: 33.6 % — ABNORMAL LOW (ref 34.0–46.6)
Hemoglobin: 11.5 g/dL (ref 11.1–15.9)
Immature Grans (Abs): 0 10*3/uL (ref 0.0–0.1)
Immature Granulocytes: 0 %
Lymphocytes Absolute: 3.4 10*3/uL — ABNORMAL HIGH (ref 0.7–3.1)
Lymphs: 38 %
MCH: 28.4 pg (ref 26.6–33.0)
MCHC: 34.2 g/dL (ref 31.5–35.7)
MCV: 83 fL (ref 79–97)
Monocytes Absolute: 0.3 10*3/uL (ref 0.1–0.9)
Monocytes: 4 %
Neutrophils Absolute: 5 10*3/uL (ref 1.4–7.0)
Neutrophils: 55 %
Platelets: 490 10*3/uL — ABNORMAL HIGH (ref 150–450)
RBC: 4.05 x10E6/uL (ref 3.77–5.28)
RDW: 13.9 % (ref 11.7–15.4)
WBC: 8.9 10*3/uL (ref 3.4–10.8)

## 2019-01-24 LAB — HEMOGLOBIN A1C
Est. average glucose Bld gHb Est-mCnc: 108 mg/dL
Hgb A1c MFr Bld: 5.4 % (ref 4.8–5.6)

## 2019-01-24 LAB — LIPID PANEL
Chol/HDL Ratio: 4.2 ratio (ref 0.0–4.4)
Cholesterol, Total: 131 mg/dL (ref 100–199)
HDL: 31 mg/dL — ABNORMAL LOW (ref >39)
LDL Calculated: 67 mg/dL (ref 0–99)
Triglycerides: 166 mg/dL — ABNORMAL HIGH (ref 0–149)
VLDL Cholesterol Cal: 33 mg/dL (ref 5–40)

## 2019-01-29 LAB — NOVEL CORONAVIRUS, NAA: SARS-CoV-2, NAA: NOT DETECTED

## 2019-02-16 ENCOUNTER — Other Ambulatory Visit: Payer: Self-pay | Admitting: Family Medicine

## 2019-02-16 NOTE — Telephone Encounter (Signed)
Requested Prescriptions  Pending Prescriptions Disp Refills  . Insulin Pen Needle (BD PEN NEEDLE NANO U/F) 32G X 4 MM MISC [Pharmacy Med Name: BD PEN NEEDLE/NANO 32GX4MM MIS] 100 each 0    Sig: USE 1 PEN NEEDLE TO INJECT INSULIN FOUR TIMES DAILY     Endocrinology: Diabetes - Testing Supplies Passed - 02/16/2019 12:40 PM      Passed - Valid encounter within last 12 months    Recent Outpatient Visits          3 weeks ago Nonintractable episodic headache, unspecified headache type   Primary Care at [facility], Arlie Solomons, MD   7 months ago Muscle spasm   Primary Care at [facility], Arlie Solomons, MD   1 year ago Annual physical exam   Primary Care at [facility], [place], [place]   1 year ago Benign essential HTN   Primary Care at [place], [place], [place]   3 years ago Benign essential HTN   Primary Care at [facility], [place], [place]      Future Appointments            In 2 months Forrest Moron, MD Primary Care at [place], [facility]

## 2019-03-12 ENCOUNTER — Other Ambulatory Visit: Payer: Self-pay | Admitting: Family Medicine

## 2019-04-23 ENCOUNTER — Other Ambulatory Visit: Payer: Self-pay | Admitting: Family Medicine

## 2019-05-01 ENCOUNTER — Telehealth: Payer: Self-pay

## 2019-05-01 ENCOUNTER — Other Ambulatory Visit: Payer: Self-pay

## 2019-05-01 ENCOUNTER — Ambulatory Visit (INDEPENDENT_AMBULATORY_CARE_PROVIDER_SITE_OTHER): Payer: BC Managed Care – PPO | Admitting: Family Medicine

## 2019-05-01 ENCOUNTER — Encounter: Payer: Self-pay | Admitting: Family Medicine

## 2019-05-01 VITALS — BP 131/83 | HR 71 | Temp 97.7°F | Resp 17 | Ht 66.0 in | Wt 209.2 lb

## 2019-05-01 DIAGNOSIS — R5383 Other fatigue: Secondary | ICD-10-CM

## 2019-05-01 DIAGNOSIS — F322 Major depressive disorder, single episode, severe without psychotic features: Secondary | ICD-10-CM

## 2019-05-01 DIAGNOSIS — Z794 Long term (current) use of insulin: Secondary | ICD-10-CM | POA: Diagnosis not present

## 2019-05-01 DIAGNOSIS — E1165 Type 2 diabetes mellitus with hyperglycemia: Secondary | ICD-10-CM | POA: Diagnosis not present

## 2019-05-01 DIAGNOSIS — E781 Pure hyperglyceridemia: Secondary | ICD-10-CM | POA: Diagnosis not present

## 2019-05-01 DIAGNOSIS — G479 Sleep disorder, unspecified: Secondary | ICD-10-CM | POA: Diagnosis not present

## 2019-05-01 LAB — GLUCOSE, POCT (MANUAL RESULT ENTRY): POC Glucose: 119 mg/dl — AB (ref 70–99)

## 2019-05-01 LAB — POCT CBC
Granulocyte percent: 57.6 %G (ref 37–80)
HCT, POC: 34.4 % (ref 29–41)
Hemoglobin: 11.7 g/dL (ref 11–14.6)
Lymph, poc: 3.9 — AB (ref 0.6–3.4)
MCH, POC: 28.3 pg (ref 27–31.2)
MCHC: 34.2 g/dL (ref 31.8–35.4)
MCV: 83 fL (ref 76–111)
MID (cbc): 0.6 (ref 0–0.9)
MPV: 7.8 fL (ref 0–99.8)
POC Granulocyte: 6 (ref 2–6.9)
POC LYMPH PERCENT: 37.1 %L (ref 10–50)
POC MID %: 5.3 %M (ref 0–12)
Platelet Count, POC: 437 10*3/uL — AB (ref 142–424)
RBC: 4.14 M/uL (ref 4.04–5.48)
RDW, POC: 13.9 %
WBC: 10.5 10*3/uL — AB (ref 4.6–10.2)

## 2019-05-01 LAB — POCT GLYCOSYLATED HEMOGLOBIN (HGB A1C): Hemoglobin A1C: 5.6 % (ref 4.0–5.6)

## 2019-05-01 MED ORDER — ESCITALOPRAM OXALATE 10 MG PO TABS: 10.0000 mg | ORAL_TABLET | Freq: Every day | ORAL | 3 refills | Status: DC

## 2019-05-01 MED ORDER — ESCITALOPRAM OXALATE 10 MG PO TABS: 10.0000 mg | ORAL_TABLET | Freq: Every day | ORAL | 2 refills | Status: DC

## 2019-05-01 MED ORDER — CITALOPRAM HYDROBROMIDE 20 MG PO TABS: ORAL_TABLET | ORAL | 3 refills | Status: DC

## 2019-05-01 MED ORDER — ESCITALOPRAM OXALATE 10 MG PO TABS: 10.0000 mg | ORAL_TABLET | Freq: Every day | ORAL | 0 refills | Status: DC

## 2019-05-01 NOTE — Telephone Encounter (Signed)
Call from Baxter Flattery at [facility] re rx for Escitalopram written as #10 quantity.  Would need refilled every ten days after titrate.  Spoke to provider.  Updated Rx to initial #26 for one month with same titrate orders then add'l Rx for #30 for following month.

## 2019-05-01 NOTE — Progress Notes (Signed)
Established Patient Office Visit  Subjective:  Patient ID: Carolyn Butler, female    DOB: Jan 19, 1971  Age: 48 y.o. MRN: [id number]  CC:  Chief Complaint  Patient presents with  . Diabetes    3 month f/u.  Wants to talk about disability due to covid 19  . Depression    score:24    HPI Carolyn Butler presents for   Severe Depression  She reports that she has a history of depression but reports that with COVID she has no interest to do things.   Depression screen [facility] 2/9 05/01/2019 01/23/2019 07/06/2018 10/19/2017 03/12/2017  Decreased Interest 3 0 3 0 0  Down, Depressed, Hopeless 3 0 2 0 0  PHQ - 2 Score 6 0 5 0 0  Altered sleeping 3 - 0 - -  Tired, decreased energy 3 - 3 - -  Change in appetite 3 - 3 - -  Feeling bad or failure about yourself  3 - 3 - -  Trouble concentrating 3 - 0 - -  Moving slowly or fidgety/restless 3 - 2 - -  Suicidal thoughts 0 - 0 - -  PHQ-9 Score 24 - 16 - -  Difficult doing work/chores Very difficult - Very difficult - -   She does not sleep well She occasionally is disturbed by numbness and tingling in her arms or pins and needles in her legs She wakes up to urinate 3 times a night Her husband has sleep disorder and snores She tries not to drink water after a certain time at night but now she just drinks if she is thirst. She tries to drink earlier in the day so she is urinating hourly   Diabetes Type 2 She reports that she is trying to avoid starches  She tries to do more vegetables Her fasting glucose is in 120s She is compliant with her meds She is doing metformin 1052m bid, lantus 30 units at bedtime., Semaglutide 0.576mweekly She deneis nausea, vomiting or diarrhea She denies hypoglycemia Wt Readings from Last 3 Encounters:  05/01/19 209 lb 3.2 oz (94.9 kg)  01/23/19 205 lb 6.4 oz (93.2 kg)  01/22/19 260 lb (117.9 kg)   Fatigue She reports that she was bitten by a tick and would like to be checked for lyme disease She does not  sleep well but worries about lyme disease She does not exercise regularly She denies any tremors She denies any history of lyme disease or prior testing Lab Results  Component Value Date   HGBA1C 5.6 05/01/2019    Past Medical History:  Diagnosis Date  . Diabetes mellitus without complication ([facility] [place]  . Hypertension     Past Surgical History:  Procedure Laterality Date  . surgery on right knee      Family History  Problem Relation Age of Onset  . Diabetes Father   . Heart disease Father   . Hyperlipidemia Father   . Diabetes Paternal Grandmother   . Heart disease Paternal Grandmother   . Diabetes Brother     Social History   Socioeconomic History  . Marital status: Married    Spouse name: ViJesica Goheen. Number of children: 0  . Years of education: 12+  . Highest education level: Not on file  Occupational History  . Occupation: unemployed    Comment: baVisual merchandiser. Financial resource strain: Not on file  . Food insecurity    Worry: Not on file  Inability: Not on file  . Transportation needs    Medical: Not on file    Non-medical: Not on file  Tobacco Use  . Smoking status: Never Smoker  . Smokeless tobacco: Never Used  Substance and Sexual Activity  . Alcohol use: No    Comment: twice a year mixed drink or red wine  . Drug use: No  . Sexual activity: Yes  Lifestyle  . Physical activity    Days per week: Not on file    Minutes per session: Not on file  . Stress: Not on file  Relationships  . Social Herbalist on phone: Not on file    Gets together: Not on file    Attends religious service: Not on file    Active member of club or organization: Not on file    Attends meetings of clubs or organizations: Not on file    Relationship status: Not on file  . Intimate partner violence    Fear of current or ex partner: Not on file    Emotionally abused: Not on file    Physically abused: Not on file    Forced sexual activity: Not on  file  Other Topics Concern  . Not on file  Social History Narrative   Lives with her husband.   Laid-off from job at [employer] (2014), and hasn't returned to work.    Outpatient Medications Prior to Visit  Medication Sig Dispense Refill  . aspirin 81 MG tablet Take 81 mg by mouth daily.    . cyclobenzaprine (FLEXERIL) 10 MG tablet TAKE ONE TABLET BY MOUTH AT BEDTIME AS NEEDED FOR MUSCLE SPASM 60 tablet 1  . gemfibrozil (LOPID) 600 MG tablet Take 1 tablet (600 mg total) by mouth 2 (two) times daily with a meal. 180 tablet 1  . glucose blood (ONE TOUCH ULTRA TEST) test strip Use to test blood sugar 3 times daily as instructed. 100 each 11  . insulin aspart (NOVOLOG FLEXPEN) 100 UNIT/ML FlexPen INJECT 15-16 UNITS SUBCUTANEOUSLY  THREE TIMES DAILY 30 mL 3  . Insulin Glargine (LANTUS SOLOSTAR) 100 UNIT/ML Solostar Pen INJECT 30 UNITS INTO THE SKIN EACH NIGHT AT BEDTIME 30 mL 3  . Insulin Pen Needle (BD PEN NEEDLE NANO U/F) 32G X 4 MM MISC USE 1 PENNEEDLE TO INJECT INSULIN 4 TIMES DAILY 100 each 0  . Lancets (ONETOUCH ULTRASOFT) lancets Use as instructed. Test blood glucose bid. E11.9 100 each 12  . losartan (COZAAR) 50 MG tablet TAKE 1 TABLET BY MOUTH ONCE DAILY 90 tablet 3  . metFORMIN (GLUCOPHAGE) 1000 MG tablet Take 1 tablet (1,000 mg total) by mouth 2 (two) times daily. 180 tablet 3  . Multiple Vitamin (MULTIVITAMIN) tablet Take 1 tablet by mouth daily.    . Omega-3 Fatty Acids (OMEGA 3 PO) Take by mouth 2 (two) times daily with a meal.    . omeprazole (PRILOSEC) 20 MG capsule Take 1 capsule by mouth once daily 30 capsule 1  . Semaglutide,0.25 or 0.'5MG'$ /DOS, (OZEMPIC, 0.25 OR 0.5 MG/DOSE,) 2 MG/1.5ML SOPN Inject 0.5 mg into the skin once a week. 2 pen 5  . VITAMIN E PO Take by mouth daily.     No facility-administered medications prior to visit.     Allergies  Allergen Reactions  . Doxycycline     ROS Review of Systems Review of Systems  Constitutional: Negative for activity  change, appetite change, chills and fever.  HENT: Negative for congestion, nosebleeds, trouble swallowing and  voice change.   Respiratory: Negative for cough, shortness of breath and wheezing.   Gastrointestinal: Negative for diarrhea, nausea and vomiting.  Genitourinary: Negative for difficulty urinating, dysuria, flank pain and hematuria.  Musculoskeletal: Negative for back pain, joint swelling and neck pain.  Neurological: Negative for dizziness, speech difficulty, light-headedness and numbness.  See HPI. All other review of systems negative.     Objective:    Physical Exam  BP 131/83 (BP Location: Right Arm, Patient Position: Sitting, Cuff Size: Large)   Pulse 71   Temp 97.7 F (36.5 C) (Oral)   Resp 17   Ht _0  (1.676 m)   Wt 209 lb 3.2 oz (94.9 kg)   SpO2 98%   BMI 33.77 kg/m  Wt Readings from Last 3 Encounters:  05/01/19 209 lb 3.2 oz (94.9 kg)  01/23/19 205 lb 6.4 oz (93.2 kg)  01/22/19 260 lb (117.9 kg)   Physical Exam  Constitutional: Oriented to person, place, and time. Appears well-developed and well-nourished.  HENT:  Head: Normocephalic and atraumatic.  Eyes: Conjunctivae and EOM are normal.  Cardiovascular: Normal rate, regular rhythm, normal heart sounds and intact distal pulses.  No murmur heard. Pulmonary/Chest: Effort normal and breath sounds normal. No stridor. No respiratory distress. Has no wheezes.  Neurological: Is alert and oriented to person, place, and time.  Skin: Skin is warm. Capillary refill takes less than 2 seconds.  Psychiatric: Has a normal mood and affect. Behavior is normal. Judgment and thought content normal.    Health Maintenance Due  Topic Date Due  . FOOT EXAM  03/12/2018  . OPHTHALMOLOGY EXAM  03/12/2018    There are no preventive care reminders to display for this patient.  Lab Results  Component Value Date   TSH 3.060 10/19/2017   Lab Results  Component Value Date   WBC 10.5 (A) 05/01/2019   HGB 11.7 05/01/2019    HCT 34.4 05/01/2019   MCV 83.0 05/01/2019   PLT 490 (H) 01/23/2019   Lab Results  Component Value Date   NA 138 01/23/2019   K 4.2 01/23/2019   CO2 23 01/23/2019   GLUCOSE 94 01/23/2019   BUN 11 01/23/2019   CREATININE 0.44 (L) 01/23/2019   BILITOT 0.3 01/23/2019   ALKPHOS 28 (L) 01/23/2019   AST 32 01/23/2019   ALT 32 01/23/2019   PROT 7.3 01/23/2019   ALBUMIN 4.8 01/23/2019   CALCIUM 9.5 01/23/2019   GFR 143.30 02/16/2013   Lab Results  Component Value Date   CHOL 131 01/23/2019   Lab Results  Component Value Date   HDL 31 (L) 01/23/2019   Lab Results  Component Value Date   LDLCALC 67 01/23/2019   Lab Results  Component Value Date   TRIG 166 (H) 01/23/2019   Lab Results  Component Value Date   CHOLHDL 4.2 01/23/2019   Lab Results  Component Value Date   HGBA1C 5.6 05/01/2019      Assessment & Plan:   Problem List Items Addressed This Visit      Endocrine   Controlled diabetes mellitus with long-term current use of insulin (HCC) (Chronic) -  Discussed exercise and dietary changes She is urinating due to her meds Her diabetes is well controlled, continue current meds  Demonstrated home exercises in the office   Relevant Orders   Ambulatory referral to Ophthalmology   POCT glucose (manual entry) (Completed)   POCT glycosylated hemoglobin (Hb A1C) (Completed)   POCT CBC (Completed)   Lipid panel  Other   Hypertriglyceridemia (Chronic) - discussed goals for fiber   Relevant Orders   Lipid panel   Sleep disturbance - discussed stress and depression and how it affects sleep   Relevant Orders   TSH    Other Visit Diagnoses    Severe depression ([place])    -  Primary Discussed the triangle approach to depression Counseling, exercise and SSRI She will start lexapro Celexa interacts with omeprazole   Relevant Medications   escitalopram (LEXAPRO) 10 MG tablet   Other Relevant Orders   Ambulatory referral to Psychology   Other fatigue   -   Discussed causes of fatigue and will screen for lyme disease Demonstrated home exercise program   Relevant Orders   POCT CBC (Completed)   TSH   CMP14+EGFR   Lyme Ab/Western Blot Reflex      Meds ordered this encounter  Medications  . DISCONTD: citalopram (CELEXA) 20 MG tablet    Sig: Take 1/2 tablet by mouth daily for one week then increase to one tablet daily by mouth    Dispense:  30 tablet    Refill:  3  . escitalopram (LEXAPRO) 10 MG tablet    Sig: Take 1 tablet (10 mg total) by mouth daily. Take 1/2 tablet by mouth for one week. Then increase to one tablet by mouth daily.    Dispense:  10 tablet    Refill:  3    Follow-up: No follow-ups on file.   A total of 45 minutes were spent face-to-face with the patient during this encounter and over half of that time was spent on counseling and coordination of care.   Forrest Moron, MD

## 2019-05-01 NOTE — Patient Instructions (Signed)
° ° ° °  If you have lab work done today you will be contacted with your lab results within the next 2 weeks.  If you have not heard from us then please contact us. The fastest way to get your results is to register for My Chart. ° ° °IF you received an x-ray today, you will receive an invoice from [facility]. Please contact [facility] at [phone] with questions or concerns regarding your invoice.  ° °IF you received labwork today, you will receive an invoice from [facility]. Please contact [facility] at [phone] with questions or concerns regarding your invoice.  ° °Our billing staff will not be able to assist you with questions regarding bills from these companies. ° °You will be contacted with the lab results as soon as they are available. The fastest way to get your results is to activate your My Chart account. Instructions are located on the last page of this paperwork. If you have not heard from us regarding the results in 2 weeks, please contact this office. °  ° ° ° °

## 2019-05-02 LAB — LIPID PANEL
Chol/HDL Ratio: 4.6 ratio — ABNORMAL HIGH (ref 0.0–4.4)
Cholesterol, Total: 134 mg/dL (ref 100–199)
HDL: 29 mg/dL — ABNORMAL LOW (ref >39)
LDL Chol Calc (NIH): 57 mg/dL (ref 0–99)
Triglycerides: 304 mg/dL — ABNORMAL HIGH (ref 0–149)
VLDL Cholesterol Cal: 48 mg/dL — ABNORMAL HIGH (ref 5–40)

## 2019-05-02 LAB — CMP14+EGFR
ALT: 25 IU/L (ref 0–32)
AST: 27 IU/L (ref 0–40)
Albumin/Globulin Ratio: 1.9 (ref 1.2–2.2)
Albumin: 5 g/dL — ABNORMAL HIGH (ref 3.8–4.8)
Alkaline Phosphatase: 39 IU/L (ref 39–117)
BUN/Creatinine Ratio: 21 (ref 9–23)
BUN: 12 mg/dL (ref 6–24)
Bilirubin Total: <0.2 mg/dL (ref 0.0–1.2)
CO2: 21 mmol/L (ref 20–29)
Calcium: 9.9 mg/dL (ref 8.7–10.2)
Chloride: 97 mmol/L (ref 96–106)
Creatinine, Ser: 0.57 mg/dL (ref 0.57–1.00)
GFR calc Af Amer: 127 mL/min/{1.73_m2} (ref >59)
GFR calc non Af Amer: 110 mL/min/{1.73_m2} (ref >59)
Globulin, Total: 2.7 g/dL (ref 1.5–4.5)
Glucose: 124 mg/dL — ABNORMAL HIGH (ref 65–99)
Potassium: 3.8 mmol/L (ref 3.5–5.2)
Sodium: 136 mmol/L (ref 134–144)
Total Protein: 7.7 g/dL (ref 6.0–8.5)

## 2019-05-02 LAB — LYME AB/WESTERN BLOT REFLEX
LYME DISEASE AB, QUANT, IGM: <0.8 index (ref 0.00–0.79)
Lyme IgG/IgM Ab: <0.91 {ISR} (ref 0.00–0.90)

## 2019-05-02 LAB — TSH: TSH: 3.96 u[IU]/mL (ref 0.450–4.500)

## 2019-05-05 ENCOUNTER — Other Ambulatory Visit: Payer: Self-pay | Admitting: Family Medicine

## 2019-05-22 ENCOUNTER — Other Ambulatory Visit: Payer: Self-pay | Admitting: Internal Medicine

## 2019-05-24 ENCOUNTER — Other Ambulatory Visit: Payer: Self-pay

## 2019-05-24 ENCOUNTER — Telehealth: Payer: Self-pay | Admitting: Internal Medicine

## 2019-05-24 DIAGNOSIS — E119 Type 2 diabetes mellitus without complications: Secondary | ICD-10-CM | POA: Diagnosis not present

## 2019-05-24 LAB — HM DIABETES EYE EXAM

## 2019-05-24 MED ORDER — OZEMPIC (0.25 OR 0.5 MG/DOSE) 2 MG/1.5ML ~~LOC~~ SOPN: 0.5000 mg | PEN_INJECTOR | SUBCUTANEOUS | 0 refills | Status: DC

## 2019-05-24 NOTE — Telephone Encounter (Signed)
MEDICATION: Semaglutide,0.25 or 0.5MG /DOS, (OZEMPIC, 0.25 OR 0.5 MG/DOSE,) 2 MG/1.5ML SOPN  PHARMACY:   [facility] [place] [phone] (Phone) [phone] (Fax)    IS THIS A 90 DAY SUPPLY : No  IS PATIENT OUT OF MEDICATION: Yes  IF NOT; HOW MUCH IS LEFT: 0  LAST APPOINTMENT DATE: @10 /27/2020  NEXT APPOINTMENT DATE:@10 /30/2020  DO WE HAVE YOUR PERMISSION TO LEAVE A DETAILED MESSAGE: Yes  OTHER COMMENTS:    **Let patient know to contact pharmacy at the end of the day to make sure medication is ready. **  ** Please notify patient to allow 48-72 hours to process**  **Encourage patient to contact the pharmacy for refills or they can request refills through [facility]**

## 2019-05-24 NOTE — Telephone Encounter (Signed)
Can she come tomorrow?  I have some openings.  If not, we can refill 1 box and I will need to see her soon for evaluation.

## 2019-05-24 NOTE — Telephone Encounter (Signed)
Pt has not bee seen in almost 1 year. Would you like to refill or have the pt be seen first?

## 2019-05-24 NOTE — Telephone Encounter (Signed)
Pt is scheduled for tomorrow, and 1 month supply of medication sent to pharmacy.

## 2019-05-25 ENCOUNTER — Ambulatory Visit (INDEPENDENT_AMBULATORY_CARE_PROVIDER_SITE_OTHER): Payer: BC Managed Care – PPO | Admitting: Internal Medicine

## 2019-05-25 ENCOUNTER — Encounter: Payer: Self-pay | Admitting: Internal Medicine

## 2019-05-25 VITALS — BP 136/80 | HR 101 | Ht 66.0 in | Wt 207.8 lb

## 2019-05-25 DIAGNOSIS — E781 Pure hyperglyceridemia: Secondary | ICD-10-CM

## 2019-05-25 DIAGNOSIS — E1165 Type 2 diabetes mellitus with hyperglycemia: Secondary | ICD-10-CM | POA: Diagnosis not present

## 2019-05-25 DIAGNOSIS — Z794 Long term (current) use of insulin: Secondary | ICD-10-CM | POA: Diagnosis not present

## 2019-05-25 DIAGNOSIS — E669 Obesity, unspecified: Secondary | ICD-10-CM

## 2019-05-25 MED ORDER — LANTUS SOLOSTAR 100 UNIT/ML ~~LOC~~ SOPN: PEN_INJECTOR | SUBCUTANEOUS | 3 refills | Status: DC

## 2019-05-25 MED ORDER — METFORMIN HCL 1000 MG PO TABS: 1000.0000 mg | ORAL_TABLET | Freq: Two times a day (BID) | ORAL | 3 refills | Status: DC

## 2019-05-25 MED ORDER — OZEMPIC (1 MG/DOSE) 2 MG/1.5ML ~~LOC~~ SOPN: 1.0000 mg | PEN_INJECTOR | SUBCUTANEOUS | 5 refills | Status: DC

## 2019-05-25 MED ORDER — NOVOLOG FLEXPEN 100 UNIT/ML ~~LOC~~ SOPN: PEN_INJECTOR | SUBCUTANEOUS | 3 refills | Status: DC

## 2019-05-25 NOTE — Patient Instructions (Addendum)
Please continue: - Metformin 1000 mg 2x a day  Please decrease: - Lantus to 30 units at night - NovoLog to 12-16 units before meals  Please increase: - Ozempic to 1 mg weekly  Please return in 4 months with your sugar log.

## 2019-05-25 NOTE — Progress Notes (Signed)
Patient ID: Carolyn Butler, female   DOB: June 28, 1971, 48 y.o.   MRN: [id number]  HPI: Carolyn Butler is a 48 y.o.-year-old female, returning for f/u for DM2, dx 2005, insulin-dependent since 01/2013, controlled, without long-term complications. Last visit was almost a year ago.  Since last visit, had 2 infected teeth.   Latest HbA1c was: Lab Results  Component Value Date   HGBA1C 5.6 05/01/2019   HGBA1C 5.4 01/23/2019   HGBA1C 6.4 (A) 05/30/2018  Previous A1c was 8.4% in 06/2012.  She has a h/o med noncompliance per PCPs notes.    She is on: - Metformin 1000 mg 2x a day - Lantus 36-40 >> 40-45 >> 40 >> 30-35 units at night - NovoLog 16-19 >> 18-20 >> 15-16 >> 16-20 units before meals - Ozempic 0.5 mg weekly -added 05/2018 >> tolerating it well now, initially Nausea, HAs She tried Toujeo in the past but developed headaches from it. Pt was on Amaryl in the past and gained 15 lbs >> stopped it and lost the weight We stopped Januvia 100 mg in 01/2013 b/c increased TG and high risk of Pancreatitis.   Pt checks her sugars 3 times a day: - am:  140s, 171 >> 130-155, 166 >> 160s, 197 >> 100-120 - 2h after breakfast:206, 240 >> n/c >> 160s - before lunch:120s >> 88, 99 >> n/c >> 130-162 >> 110-120 - 2h after lunch: 170-180 >> 163 >> 151-161 >> n/c - before dinner: 180, 193, 197 (snack) >> n/c >> 110-120 - bedtime: n/c >> 182, 189 >> 94, 173 >> n/c >> 140 Lowest sugar was 94 >> 130 >> 93; she has hypoglycemia awareness in the 80s Highest sugar was 197 >> 197 >> 167.  No CKD, last BUN/creatinine was:  Lab Results  Component Value Date   BUN 12 05/01/2019   CREATININE 0.57 05/01/2019  On losartan.  + HL; Last set of lipids: Lab Results  Component Value Date   CHOL 134 05/01/2019   HDL 29 (L) 05/01/2019   LDLCALC 57 05/01/2019   LDLDIRECT 86.8 02/16/2013   TRIG 304 (H) 05/01/2019   CHOLHDL 4.6 (H) 05/01/2019   Prev: Lab Results  Component Value Date   CHOL 170 05/09/2013   HDL 28* 05/09/2013   LDLCALC Comment:   Not calculated due to Triglyceride >400.  05/09/2013   LDLDIRECT 86.8 02/16/2013   TRIG 790* 05/09/2013   CHOLHDL 6.1 05/09/2013  She is on omega-3 fatty acids (just restarted after the last Lipid panel), gemfibrozil, Lipitor.  Pt's last eye exam was in 05/24/2019: No DR No numbness and tingling in her legs.   She also has a history of HTN.  She was exercising: Running and walking but stopped before last visit.  ROS: Constitutional: no weight gain/+ weight loss, no fatigue, no subjective hyperthermia, no subjective hypothermia Eyes: no blurry vision, no xerophthalmia ENT: no sore throat, no nodules palpated in neck, no dysphagia, no odynophagia, no hoarseness Cardiovascular: no CP/no SOB/no palpitations/no leg swelling Respiratory: no cough/no SOB/no wheezing Gastrointestinal: no N/no V/no D/no C/no acid reflux Musculoskeletal: no muscle aches/no joint aches Skin: no rashes, no hair loss Neurological: no tremors/no numbness/no tingling/no dizziness, + HAs (grinds teeth at night)  I reviewed pt's medications, allergies, PMH, social hx, family hx, and changes were documented in the history of present illness. Otherwise, unchanged from my initial visit note.  Past Medical History:  Diagnosis Date  . Diabetes mellitus without complication (HCC)   . Hypertension  Past Surgical History:  Procedure Laterality Date  . surgery on right knee     Social History   Socioeconomic History  . Marital status: Married    Spouse name: Carolyn Butler  . Number of children: 0  . Years of education: 12+  . Highest education level: Not on file  Occupational History  . Occupation: unemployed    Comment: Visual merchandiser  . Financial resource strain: Not on file  . Food insecurity    Worry: Not on file    Inability: Not on file  . Transportation needs    Medical: Not on file    Non-medical: Not on file  Tobacco Use  . Smoking status: Never  Smoker  . Smokeless tobacco: Never Used  Substance and Sexual Activity  . Alcohol use: No    Comment: twice a year mixed drink or red wine  . Drug use: No  . Sexual activity: Yes  Lifestyle  . Physical activity    Days per week: Not on file    Minutes per session: Not on file  . Stress: Not on file  Relationships  . Social Herbalist on phone: Not on file    Gets together: Not on file    Attends religious service: Not on file    Active member of club or organization: Not on file    Attends meetings of clubs or organizations: Not on file    Relationship status: Not on file  . Intimate partner violence    Fear of current or ex partner: Not on file    Emotionally abused: Not on file    Physically abused: Not on file    Forced sexual activity: Not on file  Other Topics Concern  . Not on file  Social History Narrative   Lives with her husband.   Laid-off from job at [employer] (2014), and hasn't returned to work.   Current Outpatient Medications on File Prior to Visit  Medication Sig Dispense Refill  . aspirin 81 MG tablet Take 81 mg by mouth daily.    . cyclobenzaprine (FLEXERIL) 10 MG tablet TAKE ONE TABLET BY MOUTH AT BEDTIME AS NEEDED FOR MUSCLE SPASM 60 tablet 1  . escitalopram (LEXAPRO) 10 MG tablet Take 1 tablet (10 mg total) by mouth daily. Take 1/2 tablet by mouth for one week. Then increase to one tablet by mouth daily. 26 tablet 0  . [START ON 06/01/2019] escitalopram (LEXAPRO) 10 MG tablet Take 1 tablet (10 mg total) by mouth daily. 30 tablet 2  . gemfibrozil (LOPID) 600 MG tablet Take 1 tablet (600 mg total) by mouth 2 (two) times daily with a meal. 180 tablet 1  . glucose blood (ONE TOUCH ULTRA TEST) test strip Use to test blood sugar 3 times daily as instructed. 100 each 11  . insulin aspart (NOVOLOG FLEXPEN) 100 UNIT/ML FlexPen INJECT 15-16 UNITS SUBCUTANEOUSLY  THREE TIMES DAILY 30 mL 3  . Insulin Glargine (LANTUS SOLOSTAR) 100 UNIT/ML Solostar Pen INJECT  30 UNITS INTO THE SKIN EACH NIGHT AT BEDTIME 30 mL 3  . Insulin Pen Needle (BD PEN NEEDLE NANO U/F) 32G X 4 MM MISC USE 1 PENNEEDLE TO INJECT INSULIN 4 TIMES DAILY 100 each 0  . Lancets (ONETOUCH ULTRASOFT) lancets Use as instructed. Test blood glucose bid. E11.9 100 each 12  . losartan (COZAAR) 50 MG tablet TAKE 1 TABLET BY MOUTH ONCE DAILY 90 tablet 3  . metFORMIN (GLUCOPHAGE) 1000 MG tablet Take  1 tablet (1,000 mg total) by mouth 2 (two) times daily. 180 tablet 3  . Multiple Vitamin (MULTIVITAMIN) tablet Take 1 tablet by mouth daily.    . Omega-3 Fatty Acids (OMEGA 3 PO) Take by mouth 2 (two) times daily with a meal.    . omeprazole (PRILOSEC) 20 MG capsule Take 1 capsule by mouth once daily 90 capsule 0  . Semaglutide,0.25 or 0.5MG /DOS, (OZEMPIC, 0.25 OR 0.5 MG/DOSE,) 2 MG/1.5ML SOPN Inject 0.5 mg into the skin once a week. 1 pen 0  . VITAMIN E PO Take by mouth daily.     No current facility-administered medications on file prior to visit.    Allergies  Allergen Reactions  . Doxycycline    Family History  Problem Relation Age of Onset  . Diabetes Father   . Heart disease Father   . Hyperlipidemia Father   . Diabetes Paternal Grandmother   . Heart disease Paternal Grandmother   . Diabetes Brother     PE: BP 136/80 (BP Location: Right Arm, Patient Position: Sitting, Cuff Size: Large)   Pulse (!) 101   Ht  (1.676 m)   Wt 207 lb 12.8 oz (94.3 kg)   SpO2 97%   BMI 33.54 kg/m  Body mass index is 33.54 kg/m. Wt Readings from Last 3 Encounters:  05/25/19 207 lb 12.8 oz (94.3 kg)  05/01/19 209 lb 3.2 oz (94.9 kg)  01/23/19 205 lb 6.4 oz (93.2 kg)   Constitutional: overweight, in NAD Eyes: PERRLA, EOMI, no exophthalmos ENT: moist mucous membranes, no thyromegaly, no cervical lymphadenopathy Cardiovascular: Tachycardia, RR, No MRG Respiratory: CTA B Gastrointestinal: abdomen soft, NT, ND, BS+ Musculoskeletal: no deformities, strength intact in all 4 Skin: moist, warm, no  rashes Neurological: no tremor with outstretched hands, DTR normal in all 4  ASSESSMENT: 1. DM2, insulin-dependent, controlled, without long term complications  2. HTG  3.  Obesity  PLAN:  1.  Patient with history of good diabetes control with HbA1c levels at goal, on metformin, basal-bolus insulin regimen, and GLP-1 receptor agonist added at last visit in an effort to help her lose weight.  At that time, we reduced her insulin doses.  She was then lost to follow-up for the last year. -Her sugars improved since last visit and her HbA1c decreased to 5.6% as per the last check, earlier this month.  At last visit, HbA1c was 6.4%, higher. -At this visit, sugars are at goal.  We will try to decrease doses further and increase Ozempic.  She is tolerating this well. - I advised her to:  Patient Instructions  Please continue: - Metformin 1000 mg 2x a day  Please decrease: - Lantus to 30 units at night - NovoLog to 12-16 units before meals  Please increase: - Ozempic to 1 mg weekly  Please return in 4 months with your sugar log.   - advised to check sugars at different times of the day - 3x a day, rotating check times - advised for yearly eye exams >> she is UTD - return to clinic in 4 months   2. Hypertriglyceridemia - Reviewed latest lipid panel from 05/01/2019: LDL at goal, triglycerides high, HDL low Lab Results  Component Value Date   CHOL 134 05/01/2019   HDL 29 (L) 05/01/2019   LDLCALC 57 05/01/2019   LDLDIRECT 86.8 02/16/2013   TRIG 304 (H) 05/01/2019   CHOLHDL 4.6 (H) 05/01/2019  - Continues Lopid 600 mg twice a day and omega-3 fatty acids 1 g twice  a day.  She is also on Lipitor 10 mg daily without side effects.  I advised her not to interrupt treatment.  She tells me that she was on the omega-3 fatty acid for the above results returned, but she is now back on.  3.  Obesity class II -At last visit, she was continuing to gain weight so we started Ozempic -We will continue  GLP-1 receptor agonist which should also help with weight loss - she lost a net 15 lbs after starting Ozempic  Carlus Pavlovristina Cheronda Erck, MD PhD [facility]

## 2019-05-29 ENCOUNTER — Other Ambulatory Visit: Payer: Self-pay

## 2019-05-29 ENCOUNTER — Encounter: Payer: Self-pay | Admitting: Family Medicine

## 2019-05-29 ENCOUNTER — Telehealth (INDEPENDENT_AMBULATORY_CARE_PROVIDER_SITE_OTHER): Payer: BC Managed Care – PPO | Admitting: Family Medicine

## 2019-05-29 ENCOUNTER — Telehealth: Payer: Self-pay

## 2019-05-29 VITALS — Wt 203.0 lb

## 2019-05-29 DIAGNOSIS — Z794 Long term (current) use of insulin: Secondary | ICD-10-CM | POA: Diagnosis not present

## 2019-05-29 DIAGNOSIS — F322 Major depressive disorder, single episode, severe without psychotic features: Secondary | ICD-10-CM | POA: Diagnosis not present

## 2019-05-29 DIAGNOSIS — E1165 Type 2 diabetes mellitus with hyperglycemia: Secondary | ICD-10-CM

## 2019-05-29 DIAGNOSIS — E781 Pure hyperglyceridemia: Secondary | ICD-10-CM | POA: Diagnosis not present

## 2019-05-29 NOTE — Telephone Encounter (Signed)
Patient called in stating that her meter was broken or the plastic is coming up or off not really sure what she means. She was calling in wanting to see if we had any to spare    Please call and advise

## 2019-05-29 NOTE — Progress Notes (Signed)
Telemedicine Encounter- SOAP NOTE Established Patient  This telephone encounter was conducted with the patient's (or proxy's) verbal consent via audio telecommunications: yes/no: Yes Patient was instructed to have this encounter in a suitably private space; and to only have persons present to whom they give permission to participate. In addition, patient identity was confirmed by use of name plus two identifiers (DOB and address).  I discussed the limitations, risks, security and privacy concerns of performing an evaluation and management service by telephone and the availability of in person appointments. I also discussed with the patient that there may be a patient responsible charge related to this service. The patient expressed understanding and agreed to proceed.  I spent a total of TIME; 0 MIN TO 60 MIN: 25 minutes talking with the patient or their proxy.  CC: depression, diabetes  Subjective   Carolyn Butler is a 48 y.o. established patient. Telephone visit today for  HPI  Diabetes She is being weaned off the novolog Increased the ozempic She wakes up to urinate  She denies hypoglycemia She feels low energy sometimes She denies side effects from the ozempic Lab Results  Component Value Date   HGBA1C 5.6 05/01/2019   Wt Readings from Last 3 Encounters:  05/29/19 203 lb (92.1 kg)  05/25/19 207 lb 12.8 oz (94.3 kg)  05/01/19 209 lb 3.2 oz (94.9 kg)    Severe Depression She is sleeping better on her new bed She said rest has helped her mood She has not had the energy she needs  She is a light sleeper and is also waking up to urinate  She has lots of thoughts Depression screen [facility] 2/9 05/29/2019 05/01/2019 01/23/2019 07/06/2018 10/19/2017  Decreased Interest 3 3 0 3 0  Down, Depressed, Hopeless 2 3 0 2 0  PHQ - 2 Score 5 6 0 5 0  Altered sleeping 3 3 - 0 -  Tired, decreased energy 3 3 - 3 -  Change in appetite 2 3 - 3 -  Feeling bad or failure about yourself  1 3 - 3  -  Trouble concentrating 0 3 - 0 -  Moving slowly or fidgety/restless 2 3 - 2 -  Suicidal thoughts 0 0 - 0 -  PHQ-9 Score 16 24 - 16 -  Difficult doing work/chores Very difficult Very difficult - Very difficult -        Patient Active Problem List   Diagnosis Date Noted  . Tension headache 07/06/2018  . Muscle spasm 07/06/2018  . Controlled diabetes mellitus with long-term current use of insulin (HCC) 08/14/2015  . Pseudohyponatremia 12/14/2012  . Hypertriglyceridemia 12/14/2012  . Benign essential HTN 11/20/2011  . Obesity, Class II, BMI 35-39.9 11/20/2011  . Sleep disturbance 11/20/2011    Past Medical History:  Diagnosis Date  . Diabetes mellitus without complication (HCC)   . Hypertension     Current Outpatient Medications  Medication Sig Dispense Refill  . aspirin 81 MG tablet Take 81 mg by mouth daily.    . cyclobenzaprine (FLEXERIL) 10 MG tablet TAKE ONE TABLET BY MOUTH AT BEDTIME AS NEEDED FOR MUSCLE SPASM 60 tablet 1  . gemfibrozil (LOPID) 600 MG tablet Take 1 tablet (600 mg total) by mouth 2 (two) times daily with a meal. 180 tablet 1  . glucose blood (ONE TOUCH ULTRA TEST) test strip Use to test blood sugar 3 times daily as instructed. 100 each 11  . insulin aspart (NOVOLOG FLEXPEN) 100 UNIT/ML FlexPen INJECT 15-16 UNITS  SUBCUTANEOUSLY  THREE TIMES DAILY 30 mL 3  . Insulin Glargine (LANTUS SOLOSTAR) 100 UNIT/ML Solostar Pen INJECT 30 UNITS INTO THE SKIN EACH NIGHT AT BEDTIME 30 mL 3  . Insulin Pen Needle (BD PEN NEEDLE NANO U/F) 32G X 4 MM MISC USE 1 PENNEEDLE TO INJECT INSULIN 4 TIMES DAILY 100 each 0  . Lancets (ONETOUCH ULTRASOFT) lancets Use as instructed. Test blood glucose bid. E11.9 100 each 12  . losartan (COZAAR) 50 MG tablet TAKE 1 TABLET BY MOUTH ONCE DAILY 90 tablet 3  . metFORMIN (GLUCOPHAGE) 1000 MG tablet Take 1 tablet (1,000 mg total) by mouth 2 (two) times daily. 180 tablet 3  . Multiple Vitamin (MULTIVITAMIN) tablet Take 1 tablet by mouth daily.     . Omega-3 Fatty Acids (OMEGA 3 PO) Take by mouth 2 (two) times daily with a meal.    . omeprazole (PRILOSEC) 20 MG capsule Take 1 capsule by mouth once daily 90 capsule 0  . Semaglutide, 1 MG/DOSE, (OZEMPIC, 1 MG/DOSE,) 2 MG/1.5ML SOPN Inject 1 mg into the skin once a week. 2 pen 5  . VITAMIN E PO Take by mouth daily.    Marland Kitchen. escitalopram (LEXAPRO) 10 MG tablet Take 1 tablet (10 mg total) by mouth daily. Take 1/2 tablet by mouth for one week. Then increase to one tablet by mouth daily. (Patient not taking: Reported on 05/29/2019) 26 tablet 0  . [START ON 06/01/2019] escitalopram (LEXAPRO) 10 MG tablet Take 1 tablet (10 mg total) by mouth daily. (Patient not taking: Reported on 05/29/2019) 30 tablet 2   No current facility-administered medications for this visit.     Allergies  Allergen Reactions  . Doxycycline     Social History   Socioeconomic History  . Marital status: Married    Spouse name: Luz BrazenVincent Barcellos  . Number of children: 0  . Years of education: 12+  . Highest education level: Not on file  Occupational History  . Occupation: unemployed    Comment: Warden/rangerbanker  Social Needs  . Financial resource strain: Not on file  . Food insecurity    Worry: Not on file    Inability: Not on file  . Transportation needs    Medical: Not on file    Non-medical: Not on file  Tobacco Use  . Smoking status: Never Smoker  . Smokeless tobacco: Never Used  Substance and Sexual Activity  . Alcohol use: No    Comment: twice a year mixed drink or red wine  . Drug use: No  . Sexual activity: Yes  Lifestyle  . Physical activity    Days per week: Not on file    Minutes per session: Not on file  . Stress: Not on file  Relationships  . Social Musicianconnections    Talks on phone: Not on file    Gets together: Not on file    Attends religious service: Not on file    Active member of club or organization: Not on file    Attends meetings of clubs or organizations: Not on file    Relationship status:  Not on file  . Intimate partner violence    Fear of current or ex partner: Not on file    Emotionally abused: Not on file    Physically abused: Not on file    Forced sexual activity: Not on file  Other Topics Concern  . Not on file  Social History Narrative   Lives with her husband.   Laid-off from job at [employer] (2014), and hasn't returned to work.    ROS Review of Systems  Constitutional: Negative for activity change, appetite change, chills and fever.  HENT: Negative for congestion, nosebleeds, trouble swallowing and voice change.   Respiratory: Negative for cough, shortness of breath and wheezing.   Gastrointestinal: Negative for diarrhea, nausea and vomiting.  Genitourinary: Negative for difficulty urinating, dysuria, flank pain and hematuria.  Musculoskeletal: Negative for back pain, joint swelling and neck pain.  Neurological: Negative for dizziness, speech difficulty, light-headedness and numbness.  See HPI. All other review of systems negative.   Objective   Vitals as reported by the patient: Today's Vitals   05/29/19 0828  Weight: 203 lb (92.1 kg)    Fasting glucose 120  Diagnoses and all orders for this visit:  Controlled type 2 diabetes mellitus with hyperglycemia, with long-term current use of insulin (HCC)  Severe depression ([place])  Hypertriglyceridemia  severe depression -  Advised her to seek counseling and she was not able to schedule  -  She will try to find someone close to [facility] by checking [facility] -  Start lexapro 1/2 tablet then increase to 1 tablet daily  Hypertriglyceridemia Discussed that the celexa was changed to lexapro because celexa interacts with gemfibrozil but not the lexapro Discussed that since her issue is she is having muscle cramps and hypertriglyceridemia will stop the statin and continue the fibrate   Diabetes She is being increased on ozempic and decreased insulin Advised exercise and eating at regular  intervals  Patient Instructions: Since you have a history of muscle cramps then stop the statin and take the gemfibrozil.  Start the lexapro 1/2 tablet for a week then increase to 1 tablet daily.  Check Psychologytoday.com for a counselor in your zip code.  Continue the recommendations from Endocrinology.  Continue regular exercise and meditation.  I discussed the assessment and treatment plan with the patient. The patient was provided an opportunity to ask questions and all were answered. The patient agreed with the plan and demonstrated an understanding of the instructions.   The patient was advised to call back or seek an in-person evaluation if the symptoms worsen or if the condition fails to improve as anticipated.  I provided 25 minutes of non-face-to-face time during this encounter.  Forrest Moron, MD  Primary Care at [facility]

## 2019-06-01 ENCOUNTER — Other Ambulatory Visit: Payer: Self-pay | Admitting: Family Medicine

## 2019-07-22 ENCOUNTER — Other Ambulatory Visit: Payer: Self-pay | Admitting: Family Medicine

## 2019-07-22 DIAGNOSIS — E781 Pure hyperglyceridemia: Secondary | ICD-10-CM

## 2019-07-23 ENCOUNTER — Other Ambulatory Visit: Payer: Self-pay | Admitting: Family Medicine

## 2019-08-14 ENCOUNTER — Other Ambulatory Visit: Payer: Self-pay | Admitting: Family Medicine

## 2019-08-14 DIAGNOSIS — E781 Pure hyperglyceridemia: Secondary | ICD-10-CM

## 2019-08-14 NOTE — Telephone Encounter (Signed)
Requested Prescriptions  Pending Prescriptions Disp Refills  . gemfibrozil (LOPID) 600 MG tablet [Pharmacy Med Name: Gemfibrozil 600 MG Oral Tablet] 60 tablet 0    Sig: TAKE 1 TABLET BY MOUTH TWICE DAILY WITH MEALS     Cardiovascular:  Antilipid - Fibric Acid Derivatives Failed - 08/14/2019  1:06 PM      Failed - HDL in normal range and within 360 days    HDL  Date Value Ref Range Status  05/01/2019 29 (L) >39 mg/dL Final         Failed - Triglycerides in normal range and within 360 days    Triglycerides  Date Value Ref Range Status  05/01/2019 304 (H) 0 - 149 mg/dL Final         Passed - Total Cholesterol in normal range and within 360 days    Cholesterol, Total  Date Value Ref Range Status  05/01/2019 134 100 - 199 mg/dL Final         Passed - LDL in normal range and within 360 days    LDL Chol Calc (NIH)  Date Value Ref Range Status  05/01/2019 57 0 - 99 mg/dL Final   Direct LDL  Date Value Ref Range Status  02/16/2013 86.8 mg/dL Final    Comment:    Optimal:  <100 mg/dLNear or Above Optimal:  100-129 mg/dLBorderline High:  130-159 mg/dLHigh:  160-189 mg/dLVery High:  >190 mg/dL         Passed - ALT in normal range and within 180 days    ALT  Date Value Ref Range Status  05/01/2019 25 0 - 32 IU/L Final         Passed - AST in normal range and within 180 days    AST  Date Value Ref Range Status  05/01/2019 27 0 - 40 IU/L Final         Passed - Cr in normal range and within 180 days    Creat  Date Value Ref Range Status  10/28/2015 0.41 (L) 0.50 - 1.10 mg/dL Final   Creatinine, Ser  Date Value Ref Range Status  05/01/2019 0.57 0.57 - 1.00 mg/dL Final         Passed - eGFR in normal range and within 180 days    GFR, Est African American  Date Value Ref Range Status  11/14/2014 >89 mL/min Final   GFR calc Af Amer  Date Value Ref Range Status  05/01/2019 127 >59 mL/min/1.73 Final   GFR, Est Non African American  Date Value Ref Range Status   11/14/2014 >89 mL/min Final    Comment:      The estimated GFR is a calculation valid for adults (>=39 years old) that uses the CKD-EPI algorithm to adjust for age and sex. It is   not to be used for children, pregnant women, hospitalized patients,    patients on dialysis, or with rapidly changing kidney function. According to the NKDEP, eGFR >89 is normal, 60-89 shows mild impairment, 30-59 shows moderate impairment, 15-29 shows severe impairment and <15 is ESRD.      GFR calc non Af Amer  Date Value Ref Range Status  05/01/2019 110 >59 mL/min/1.73 Final   GFR  Date Value Ref Range Status  02/16/2013 143.30 >60.00 mL/min Final         Passed - Valid encounter within last 12 months    Recent Outpatient Visits          2 months ago Controlled type 2  diabetes mellitus with hyperglycemia, with long-term current use of insulin ([place])   Primary Care at [facility], Zoe A, MD   3 months ago Severe depression [facility])   Primary Care at [facility], [place], MD   6 months ago Nonintractable episodic headache, unspecified headache type   Primary Care at [facility], Arlie Solomons, MD   6 months ago Cough   Primary Care at [facility], Ranell Patrick, MD   9 months ago Controlled type 2 diabetes mellitus with hyperglycemia, with long-term current use of insulin ([place])   Primary Care at [facility], [place] A, MD             . omeprazole (PRILOSEC) 20 MG capsule [Pharmacy Med Name: Omeprazole 20 MG Oral Capsule Delayed Release] 90 capsule 0    Sig: Take 1 capsule by mouth once daily     Gastroenterology: Proton Pump Inhibitors Passed - 08/14/2019  1:06 PM      Passed - Valid encounter within last 12 months    Recent Outpatient Visits          2 months ago Controlled type 2 diabetes mellitus with hyperglycemia, with long-term current use of insulin ([place])   Primary Care at [facility], Zoe A, MD   3 months ago Severe depression [facility])   Primary Care at [place], MD   6 months ago Nonintractable episodic headache, unspecified headache type   Primary Care at [facility], Arlie Solomons, MD   6 months ago Cough   Primary Care at [facility], Ranell Patrick, MD   9 months ago Controlled type 2 diabetes mellitus with hyperglycemia, with long-term current use of insulin [facility])   Primary Care at [facility], Arlie Solomons, MD

## 2019-09-12 ENCOUNTER — Other Ambulatory Visit: Payer: Self-pay | Admitting: Family Medicine

## 2019-09-16 ENCOUNTER — Other Ambulatory Visit: Payer: Self-pay | Admitting: Family Medicine

## 2019-09-16 DIAGNOSIS — E781 Pure hyperglyceridemia: Secondary | ICD-10-CM

## 2019-09-21 ENCOUNTER — Other Ambulatory Visit: Payer: Self-pay

## 2019-09-24 ENCOUNTER — Ambulatory Visit (INDEPENDENT_AMBULATORY_CARE_PROVIDER_SITE_OTHER): Payer: BC Managed Care – PPO | Admitting: Internal Medicine

## 2019-09-24 ENCOUNTER — Other Ambulatory Visit: Payer: Self-pay

## 2019-09-24 ENCOUNTER — Encounter: Payer: Self-pay | Admitting: Internal Medicine

## 2019-09-24 VITALS — BP 130/80 | HR 78 | Ht 66.0 in | Wt 199.0 lb

## 2019-09-24 DIAGNOSIS — E669 Obesity, unspecified: Secondary | ICD-10-CM | POA: Diagnosis not present

## 2019-09-24 DIAGNOSIS — Z794 Long term (current) use of insulin: Secondary | ICD-10-CM | POA: Diagnosis not present

## 2019-09-24 DIAGNOSIS — E1165 Type 2 diabetes mellitus with hyperglycemia: Secondary | ICD-10-CM

## 2019-09-24 DIAGNOSIS — E781 Pure hyperglyceridemia: Secondary | ICD-10-CM

## 2019-09-24 LAB — POCT GLYCOSYLATED HEMOGLOBIN (HGB A1C): Hemoglobin A1C: 5.5 % (ref 4.0–5.6)

## 2019-09-24 NOTE — Patient Instructions (Addendum)
Please continue: - Metformin 1000 mg 2x a day - Lantus 30 units at night - Ozempic to 1 mg weekly  Please add: - NovoLog 5-6 units if needed before dinner  Please return in 6 months with your sugar log.

## 2019-09-24 NOTE — Addendum Note (Signed)
Addended by: Darliss Ridgel I on: 09/24/2019 02:45 PM   Modules accepted: Orders

## 2019-09-24 NOTE — Progress Notes (Signed)
Patient ID: Carolyn Butler, female   DOB: 14-Feb-1971, 49 y.o.   MRN: [id number]  This visit occurred during the SARS-CoV-2 public health emergency.  Safety protocols were in place, including screening questions prior to the visit, additional usage of staff PPE, and extensive cleaning of exam room while observing appropriate contact time as indicated for disinfecting solutions.   HPI: Carolyn Butler is a 49 y.o.-year-old female, returning for f/u for DM2, dx 2005, insulin-dependent since 01/2013, controlled, without long-term complications. Last visit was 4 months ago.  Reviewed HbA1c levels: Lab Results  Component Value Date   HGBA1C 5.6 05/01/2019   HGBA1C 5.4 01/23/2019   HGBA1C 6.4 (A) 05/30/2018  Previous A1c was 8.4% in 06/2012.  She has a h/o med noncompliance per PCPs notes.    She is on: - Metformin 1000 mg 2x a day - Lantus 36-40 >> 40-45 >> 40 >> 30-35 >> 30 units at night - NovoLog 16-19 >> 18-20 >> 15-16 >> 16-20 >> 12-16  units before meals >> stopped 05/2019 - Ozempic 0.5 mg weekly -added 05/2018 >> tolerating it well now, initially Nausea, HAs >> 1 mg weekly - some mild nausea, also headaches, unclear if from Ozempic She tried Toujeo in the past but developed headaches from it. Pt was on Amaryl in the past and gained 15 lbs >> stopped it and lost the weight We stopped Januvia 100 mg in 01/2013 b/c increased TG and high risk of Pancreatitis.   Pt checks her sugars 2-3 times a day: - am:  130-155, 166 >> 160s, 197 >> 100-120 >> 111-127 - 2h after breakfast:206, 240 >> n/c >> 160s >> n/c - before lunch: 88, 99 >> n/c >> 130-162 >> 110-120 >> n/c - 2h after lunch: 170-180 >> 163 >> 151-161 >> 114, 170 - before dinner: 180, 193, 197 >> n/c >> 110-120 >> n/c - bedtime:182, 189 >> 94, 173 >> n/c >> 140 >> 140-190, 227 (after 170) Lowest sugar was 94 >> 130 >> 93 >> 110; she has hypoglycemia awareness in the 80s Highest sugar was 197 >> 197 >> 167 >> 227.  She did a 14 day  cleanse since last OV. She now eats dinner before 7 pm.  No CKD, last BUN/creatinine was:  Lab Results  Component Value Date   BUN 12 05/01/2019   CREATININE 0.57 05/01/2019  On losartan.  + HL; Last set of lipids: Lab Results  Component Value Date   CHOL 134 05/01/2019   HDL 29 (L) 05/01/2019   LDLCALC 57 05/01/2019   LDLDIRECT 86.8 02/16/2013   TRIG 304 (H) 05/01/2019   CHOLHDL 4.6 (H) 05/01/2019   Prev: Lab Results  Component Value Date   CHOL 170 05/09/2013   HDL 28* 05/09/2013   LDLCALC Comment:   Not calculated due to Triglyceride >400.  05/09/2013   LDLDIRECT 86.8 02/16/2013   TRIG 790* 05/09/2013   CHOLHDL 6.1 05/09/2013  On omega-3 fatty acids, gemfibrozil, Lipitor  Pt's last eye exam was in 05/24/2019: No DR She denies numbness and tingling in her legs.   She also has a history of HTN. She has a history of bruxism.  Her father and sister had COVID-19.  They are both in [place].  They recovered well.  ROS: Constitutional: no weight gain/+ weight loss, no fatigue, no subjective hyperthermia, no subjective hypothermia Eyes: no blurry vision, no xerophthalmia ENT: no sore throat, no nodules palpated in neck, no dysphagia, no odynophagia, no hoarseness Cardiovascular: no CP/no SOB/no  palpitations/no leg swelling Respiratory: no cough/no SOB/no wheezing Gastrointestinal: + N/no V/no D/no C/no acid reflux Musculoskeletal: no muscle aches/no joint aches Skin: no rashes, no hair loss Neurological: no tremors/no numbness/no tingling/no dizziness, + HAs  I reviewed pt's medications, allergies, PMH, social hx, family hx, and changes were documented in the history of present illness. Otherwise, unchanged from my initial visit note.   Past Medical History:  Diagnosis Date  . Diabetes mellitus without complication ([place])   . Hypertension    Past Surgical History:  Procedure Laterality Date  . surgery on right knee     Social History   Socioeconomic History   . Marital status: Married    Spouse name: Carolyn Butler  . Number of children: 0  . Years of education: 12+  . Highest education level: Not on file  Occupational History  . Occupation: unemployed    Comment: Customer service manager  Tobacco Use  . Smoking status: Never Smoker  . Smokeless tobacco: Never Used  Substance and Sexual Activity  . Alcohol use: No    Comment: twice a year mixed drink or red wine  . Drug use: No  . Sexual activity: Yes  Other Topics Concern  . Not on file  Social History Narrative   Lives with her husband.   Laid-off from job at [employer] (2014), and hasn't returned to work.   Social Determinants of Health   Financial Resource Strain:   . Difficulty of Paying Living Expenses: Not on file  Food Insecurity:   . Worried About Charity fundraiser in the Last Year: Not on file  . Ran Out of Food in the Last Year: Not on file  Transportation Needs:   . Lack of Transportation (Medical): Not on file  . Lack of Transportation (Non-Medical): Not on file  Physical Activity:   . Days of Exercise per Week: Not on file  . Minutes of Exercise per Session: Not on file  Stress:   . Feeling of Stress : Not on file  Social Connections:   . Frequency of Communication with Friends and Family: Not on file  . Frequency of Social Gatherings with Friends and Family: Not on file  . Attends Religious Services: Not on file  . Active Member of Clubs or Organizations: Not on file  . Attends Archivist Meetings: Not on file  . Marital Status: Not on file  Intimate Partner Violence:   . Fear of Current or Ex-Partner: Not on file  . Emotionally Abused: Not on file  . Physically Abused: Not on file  . Sexually Abused: Not on file   Current Outpatient Medications on File Prior to Visit  Medication Sig Dispense Refill  . aspirin 81 MG tablet Take 81 mg by mouth daily.    . BD PEN NEEDLE NANO 2ND GEN 32G X 4 MM MISC USE 1 PEN NEEDLE FOUR TIMES DAILY 100 each 0  .  cyclobenzaprine (FLEXERIL) 10 MG tablet TAKE ONE TABLET BY MOUTH AT BEDTIME AS NEEDED FOR MUSCLE SPASM 60 tablet 1  . escitalopram (LEXAPRO) 10 MG tablet Take 1 tablet (10 mg total) by mouth daily. Take 1/2 tablet by mouth for one week. Then increase to one tablet by mouth daily. (Patient not taking: Reported on 05/29/2019) 26 tablet 0  . escitalopram (LEXAPRO) 10 MG tablet Take 1 tablet (10 mg total) by mouth daily. (Patient not taking: Reported on 05/29/2019) 30 tablet 2  . gemfibrozil (LOPID) 600 MG tablet TAKE 1 TABLET BY MOUTH  TWICE DAILY WITH MEALS 60 tablet 2  . glucose blood (ONE TOUCH ULTRA TEST) test strip Use to test blood sugar 3 times daily as instructed. 100 each 11  . insulin aspart (NOVOLOG FLEXPEN) 100 UNIT/ML FlexPen INJECT 15-16 UNITS SUBCUTANEOUSLY  THREE TIMES DAILY 30 mL 3  . Insulin Glargine (LANTUS SOLOSTAR) 100 UNIT/ML Solostar Pen INJECT 30 UNITS INTO THE SKIN EACH NIGHT AT BEDTIME 30 mL 3  . Lancets (ONETOUCH ULTRASOFT) lancets Use as instructed. Test blood glucose bid. E11.9 100 each 12  . losartan (COZAAR) 50 MG tablet TAKE 1 TABLET BY MOUTH ONCE DAILY 90 tablet 3  . metFORMIN (GLUCOPHAGE) 1000 MG tablet Take 1 tablet (1,000 mg total) by mouth 2 (two) times daily. 180 tablet 3  . Multiple Vitamin (MULTIVITAMIN) tablet Take 1 tablet by mouth daily.    . Omega-3 Fatty Acids (OMEGA 3 PO) Take by mouth 2 (two) times daily with a meal.    . omeprazole (PRILOSEC) 20 MG capsule Take 1 capsule by mouth once daily 90 capsule 0  . Semaglutide, 1 MG/DOSE, (OZEMPIC, 1 MG/DOSE,) 2 MG/1.5ML SOPN Inject 1 mg into the skin once a week. 2 pen 5  . VITAMIN E PO Take by mouth daily.     No current facility-administered medications on file prior to visit.   Allergies  Allergen Reactions  . Doxycycline    Family History  Problem Relation Age of Onset  . Diabetes Father   . Heart disease Father   . Hyperlipidemia Father   . Diabetes Paternal Grandmother   . Heart disease Paternal  Grandmother   . Diabetes Brother     PE: BP 130/80   Pulse 78   Ht 5\' 6"  (1.676 m)   Wt 199 lb (90.3 kg)   SpO2 98%   BMI 32.12 kg/m  Body mass index is 32.12 kg/m. Wt Readings from Last 3 Encounters:  09/24/19 199 lb (90.3 kg)  05/29/19 203 lb (92.1 kg)  05/25/19 207 lb 12.8 oz (94.3 kg)   Constitutional: overweight, in NAD Eyes: PERRLA, EOMI, no exophthalmos ENT: moist mucous membranes, no thyromegaly, no cervical lymphadenopathy Cardiovascular: RRR, No MRG Respiratory: CTA B Gastrointestinal: abdomen soft, NT, ND, BS+ Musculoskeletal: no deformities, strength intact in all 4 Skin: moist, warm, no rashes Neurological: no tremor with outstretched hands, DTR normal in all 4  ASSESSMENT: 1. DM2, insulin-dependent, controlled, without long term complications  2. HTG  3.  Obesity class II  PLAN:  1.  Patient with history of good diabetes control with HbA1c levels at goal, on Metformin, basal-bolus insulin regimen and weekly GLP-1 receptor agonist, added mostly for its weight loss effects -At last visit, sugars were improved and at goal.  HbA1c was excellent, at 5.6%, decreased from 6.4%.  I advised her to decrease the dose of insulin and increase Ozempic to 1 mg weekly.  She tolerates this, but has some mild nausea.  She also has headaches, but she is not convinced that this is from Ozempic. -At this visit, sugars are still at goal even after eliminating NovoLog completely.  She only had very few hyperglycemic spikes and we discussed that for a larger dinner, she may need to use few units of insulin.  Otherwise, I do not feel we need to add the rapid acting insulin back completely.  For now, we will continue Ozempic at the current dose but I advised her to let me know if she prefers to decrease the dose in the future.  She  is very happy with her weight loss and improvement in her blood sugars so for now she would like to stay with a higher dose. - I advised her to:  Patient  Instructions  Please continue: - Metformin 1000 mg 2x a day - Lantus 30 units at night - Ozempic 1 mg weekly  Please add: - NovoLog 5-6 units if needed before dinner  Please return in 4 months with your sugar log.   - we checked her HbA1c: 5.5% (better) - advised to check sugars at different times of the day - 1-3x a day, rotating check times - advised for yearly eye exams >> she is UTD - return to clinic in 6 months   2. Hypertriglyceridemia -Reviewed latest lipid panel from 04/2019: LDL at goal, triglycerides high, HDL low: Lab Results  Component Value Date   CHOL 134 05/01/2019   HDL 29 (L) 05/01/2019   LDLCALC 57 05/01/2019   LDLDIRECT 86.8 02/16/2013   TRIG 304 (H) 05/01/2019   CHOLHDL 4.6 (H) 05/01/2019  -Continues Lipitor 10 mg daily, Lopid 600 mg twice a day, omega-3 fatty acids 1 g twice a day.  She has no side effects from the regimen.  She was off omega-3 fatty acids before the above lipid panel was checked.  Now back on it.  3.  Obesity class II -Continue Ozempic which should also help with weight loss.  She initially lost 15 pounds after starting Ozempic -lost 8 more lbs since last OV   Carlus Pavlov, MD PhD [facility] Endocrinology

## 2019-11-05 ENCOUNTER — Other Ambulatory Visit: Payer: Self-pay | Admitting: Family Medicine

## 2019-11-05 ENCOUNTER — Other Ambulatory Visit: Payer: Self-pay | Admitting: Internal Medicine

## 2019-11-17 ENCOUNTER — Other Ambulatory Visit: Payer: Self-pay | Admitting: Family Medicine

## 2019-11-17 DIAGNOSIS — I10 Essential (primary) hypertension: Secondary | ICD-10-CM

## 2019-11-17 NOTE — Telephone Encounter (Signed)
Requested Prescriptions  Pending Prescriptions Disp Refills  . losartan (COZAAR) 50 MG tablet [Pharmacy Med Name: Losartan Potassium 50 MG Oral Tablet] 90 tablet 0    Sig: Take 1 tablet by mouth once daily     Cardiovascular:  Angiotensin Receptor Blockers Failed - 11/17/2019 10:40 AM      Failed - Cr in normal range and within 180 days    Creat  Date Value Ref Range Status  10/28/2015 0.41 (L) 0.50 - 1.10 mg/dL Final   Creatinine, Ser  Date Value Ref Range Status  05/01/2019 0.57 0.57 - 1.00 mg/dL Final         Failed - K in normal range and within 180 days    Potassium  Date Value Ref Range Status  05/01/2019 3.8 3.5 - 5.2 mmol/L Final         Passed - Patient is not pregnant      Passed - Last BP in normal range    BP Readings from Last 1 Encounters:  09/24/19 130/80         Passed - Valid encounter within last 6 months    Recent Outpatient Visits          5 months ago Controlled type 2 diabetes mellitus with hyperglycemia, with long-term current use of insulin (HCC)   Primary Care at [facility], Zoe A, MD   6 months ago Severe depression [facility])   Primary Care at [place], Zoe A, MD   9 months ago Nonintractable episodic headache, unspecified headache type   Primary Care at [facility], Manus Rudd, MD   9 months ago Cough   [facility] at [facility], Asencion Partridge, MD   1 year ago Controlled type 2 diabetes mellitus with hyperglycemia, with long-term current use of insulin (HCC)   Primary Care at [facility], Zoe A, MD             . omeprazole (PRILOSEC) 20 MG capsule [Pharmacy Med Name: Omeprazole 20 MG Oral Capsule Delayed Release] 90 capsule 2    Sig: Take 1 capsule by mouth once daily     Gastroenterology: Proton Pump Inhibitors Passed - 11/17/2019 10:40 AM      Passed - Valid encounter within last 12 months    Recent Outpatient Visits          5 months ago Controlled type 2 diabetes mellitus with hyperglycemia, with long-term current  use of insulin (HCC)   Primary Care at [facility], Zoe A, MD   6 months ago Severe depression [facility])   Primary Care at [facility], Zoe A, MD   9 months ago Nonintractable episodic headache, unspecified headache type   Primary Care at [facility], Manus Rudd, MD   9 months ago Cough   [facility] at [facility], Asencion Partridge, MD   1 year ago Controlled type 2 diabetes mellitus with hyperglycemia, with long-term current use of insulin [facility])   Primary Care at [facility], Manus Rudd, MD

## 2019-12-21 ENCOUNTER — Other Ambulatory Visit: Payer: Self-pay | Admitting: Family Medicine

## 2019-12-21 DIAGNOSIS — M62838 Other muscle spasm: Secondary | ICD-10-CM

## 2019-12-21 DIAGNOSIS — E781 Pure hyperglyceridemia: Secondary | ICD-10-CM

## 2019-12-21 NOTE — Telephone Encounter (Signed)
Requested medication (s) are due for refill today -unknown- expired Rx  Requested medication (s) are on the active medication list yes  Future visit scheduled -no  Last refill: 07/06/18 1 RF  Notes to clinic: Request for non delegated Rx  Requested Prescriptions  Pending Prescriptions Disp Refills   cyclobenzaprine (FLEXERIL) 10 MG tablet [Pharmacy Med Name: Cyclobenzaprine HCl 10 MG Oral Tablet] 30 tablet 0    Sig: TAKE 1 TABLET BY MOUTH AT BEDTIME AS NEEDED FOR MUSCLE SPASM      Not Delegated - Analgesics:  Muscle Relaxants Failed - 12/21/2019 12:24 PM      Failed - This refill cannot be delegated      Failed - Valid encounter within last 6 months    Recent Outpatient Visits           6 months ago Controlled type 2 diabetes mellitus with hyperglycemia, with long-term current use of insulin (HCC)   Primary Care at [facility], Zoe A, MD   7 months ago Severe depression [facility])   Primary Care at [facility], Zoe A, MD   11 months ago Nonintractable episodic headache, unspecified headache type   Primary Care at [facility], Manus Rudd, MD   11 months ago Cough   [facility] at [facility], Asencion Partridge, MD   1 year ago Controlled type 2 diabetes mellitus with hyperglycemia, with long-term current use of insulin (HCC)   Primary Care at [facility], Manus Rudd, MD                  Requested Prescriptions  Pending Prescriptions Disp Refills   cyclobenzaprine (FLEXERIL) 10 MG tablet [Pharmacy Med Name: Cyclobenzaprine HCl 10 MG Oral Tablet] 30 tablet 0    Sig: TAKE 1 TABLET BY MOUTH AT BEDTIME AS NEEDED FOR MUSCLE SPASM      Not Delegated - Analgesics:  Muscle Relaxants Failed - 12/21/2019 12:24 PM      Failed - This refill cannot be delegated      Failed - Valid encounter within last 6 months    Recent Outpatient Visits           6 months ago Controlled type 2 diabetes mellitus with hyperglycemia, with long-term current use of insulin (HCC)   Primary Care at  [facility], Zoe A, MD   7 months ago Severe depression [facility])   Primary Care at [facility], Zoe A, MD   11 months ago Nonintractable episodic headache, unspecified headache type   Primary Care at [facility], Manus Rudd, MD   11 months ago Cough   Primary Care at [facility], Asencion Partridge, MD   1 year ago Controlled type 2 diabetes mellitus with hyperglycemia, with long-term current use of insulin [facility])   Primary Care at [facility], Manus Rudd, MD

## 2019-12-21 NOTE — Telephone Encounter (Signed)
Requested medication (s) are due for refill today -yes  Requested medication (s) are on the active medication list -yes  Future visit scheduled -no  Last refill: 11/10/19  Notes to clinic: Rx RF request- Dr Nolon Rod, fails lab protocol  Requested Prescriptions  Pending Prescriptions Disp Refills   gemfibrozil (LOPID) 600 MG tablet [Pharmacy Med Name: Gemfibrozil 600 MG Oral Tablet] 60 tablet 0    Sig: TAKE 1 TABLET BY MOUTH TWICE DAILY WITH MEALS      Cardiovascular:  Antilipid - Fibric Acid Derivatives Failed - 12/21/2019 12:23 PM      Failed - HDL in normal range and within 360 days    HDL  Date Value Ref Range Status  05/01/2019 29 (L) >39 mg/dL Final          Failed - Triglycerides in normal range and within 360 days    Triglycerides  Date Value Ref Range Status  05/01/2019 304 (H) 0 - 149 mg/dL Final          Failed - ALT in normal range and within 180 days    ALT  Date Value Ref Range Status  05/01/2019 25 0 - 32 IU/L Final          Failed - AST in normal range and within 180 days    AST  Date Value Ref Range Status  05/01/2019 27 0 - 40 IU/L Final          Failed - Cr in normal range and within 180 days    Creat  Date Value Ref Range Status  10/28/2015 0.41 (L) 0.50 - 1.10 mg/dL Final   Creatinine, Ser  Date Value Ref Range Status  05/01/2019 0.57 0.57 - 1.00 mg/dL Final          Failed - eGFR in normal range and within 180 days    GFR, Est African American  Date Value Ref Range Status  11/14/2014 >89 mL/min Final   GFR calc Af Amer  Date Value Ref Range Status  05/01/2019 127 >59 mL/min/1.73 Final   GFR, Est Non African American  Date Value Ref Range Status  11/14/2014 >89 mL/min Final    Comment:      The estimated GFR is a calculation valid for adults (>=91 years old) that uses the CKD-EPI algorithm to adjust for age and sex. It is   not to be used for children, pregnant women, hospitalized patients,    patients on dialysis, or with  rapidly changing kidney function. According to the NKDEP, eGFR >89 is normal, 60-89 shows mild impairment, 30-59 shows moderate impairment, 15-29 shows severe impairment and <15 is ESRD.      GFR calc non Af Amer  Date Value Ref Range Status  05/01/2019 110 >59 mL/min/1.73 Final   GFR  Date Value Ref Range Status  02/16/2013 143.30 >60.00 mL/min Final          Passed - Total Cholesterol in normal range and within 360 days    Cholesterol, Total  Date Value Ref Range Status  05/01/2019 134 100 - 199 mg/dL Final          Passed - LDL in normal range and within 360 days    LDL Chol Calc (NIH)  Date Value Ref Range Status  05/01/2019 57 0 - 99 mg/dL Final   Direct LDL  Date Value Ref Range Status  02/16/2013 86.8 mg/dL Final    Comment:    Optimal:  <100 mg/dLNear or Above Optimal:  100-129  mg/dLBorderline High:  130-159 mg/dLHigh:  160-189 mg/dLVery High:  >190 mg/dL          Passed - Valid encounter within last 12 months    Recent Outpatient Visits           6 months ago Controlled type 2 diabetes mellitus with hyperglycemia, with long-term current use of insulin ([place])   Primary Care at [place], MD   7 months ago Severe depression [facility])   Primary Care at [place], MD   11 months ago Nonintractable episodic headache, unspecified headache type   Primary Care at [facility], Arlie Solomons, MD   11 months ago Cough   Primary Care at [facility], Ranell Patrick, MD   1 year ago Controlled type 2 diabetes mellitus with hyperglycemia, with long-term current use of insulin ([place])   Primary Care at [place], MD                  Requested Prescriptions  Pending Prescriptions Disp Refills   gemfibrozil (LOPID) 600 MG tablet [Pharmacy Med Name: Gemfibrozil 600 MG Oral Tablet] 60 tablet 0    Sig: TAKE 1 TABLET BY MOUTH TWICE DAILY WITH MEALS      Cardiovascular:  Antilipid - Fibric Acid Derivatives Failed - 12/21/2019 12:23 PM       Failed - HDL in normal range and within 360 days    HDL  Date Value Ref Range Status  05/01/2019 29 (L) >39 mg/dL Final          Failed - Triglycerides in normal range and within 360 days    Triglycerides  Date Value Ref Range Status  05/01/2019 304 (H) 0 - 149 mg/dL Final          Failed - ALT in normal range and within 180 days    ALT  Date Value Ref Range Status  05/01/2019 25 0 - 32 IU/L Final          Failed - AST in normal range and within 180 days    AST  Date Value Ref Range Status  05/01/2019 27 0 - 40 IU/L Final          Failed - Cr in normal range and within 180 days    Creat  Date Value Ref Range Status  10/28/2015 0.41 (L) 0.50 - 1.10 mg/dL Final   Creatinine, Ser  Date Value Ref Range Status  05/01/2019 0.57 0.57 - 1.00 mg/dL Final          Failed - eGFR in normal range and within 180 days    GFR, Est African American  Date Value Ref Range Status  11/14/2014 >89 mL/min Final   GFR calc Af Amer  Date Value Ref Range Status  05/01/2019 127 >59 mL/min/1.73 Final   GFR, Est Non African American  Date Value Ref Range Status  11/14/2014 >89 mL/min Final    Comment:      The estimated GFR is a calculation valid for adults (>=63 years old) that uses the CKD-EPI algorithm to adjust for age and sex. It is   not to be used for children, pregnant women, hospitalized patients,    patients on dialysis, or with rapidly changing kidney function. According to the NKDEP, eGFR >89 is normal, 60-89 shows mild impairment, 30-59 shows moderate impairment, 15-29 shows severe impairment and <15 is ESRD.      GFR calc non Af Amer  Date Value Ref Range Status  05/01/2019 110 >59 mL/min/1.73 Final   GFR  Date Value Ref Range Status  02/16/2013 143.30 >60.00 mL/min Final          Passed - Total Cholesterol in normal range and within 360 days    Cholesterol, Total  Date Value Ref Range Status  05/01/2019 134 100 - 199 mg/dL Final          Passed -  LDL in normal range and within 360 days    LDL Chol Calc (NIH)  Date Value Ref Range Status  05/01/2019 57 0 - 99 mg/dL Final   Direct LDL  Date Value Ref Range Status  02/16/2013 86.8 mg/dL Final    Comment:    Optimal:  <100 mg/dLNear or Above Optimal:  100-129 mg/dLBorderline High:  130-159 mg/dLHigh:  160-189 mg/dLVery High:  >190 mg/dL          Passed - Valid encounter within last 12 months    Recent Outpatient Visits           6 months ago Controlled type 2 diabetes mellitus with hyperglycemia, with long-term current use of insulin ([place])   Primary Care at [place], MD   7 months ago Severe depression [facility])   Primary Care at [facility], Zoe A, MD   11 months ago Nonintractable episodic headache, unspecified headache type   Primary Care at [facility], Arlie Solomons, MD   11 months ago Cough   Primary Care at [facility], Ranell Patrick, MD   1 year ago Controlled type 2 diabetes mellitus with hyperglycemia, with long-term current use of insulin [facility])   Primary Care at [facility], Arlie Solomons, MD

## 2019-12-22 ENCOUNTER — Other Ambulatory Visit: Payer: Self-pay | Admitting: Family Medicine

## 2019-12-22 DIAGNOSIS — E781 Pure hyperglyceridemia: Secondary | ICD-10-CM

## 2019-12-22 NOTE — Telephone Encounter (Signed)
Requested Prescriptions  Pending Prescriptions Disp Refills  . BD PEN NEEDLE NANO 2ND GEN 32G X 4 MM MISC [Pharmacy Med Name: BD PEN NEEDLE/NANO 32GX4MM MIS] 100 each 1    Sig: USE 1 NEEDLE TO INJECT 4 TIMES DAILY     Endocrinology: Diabetes - Testing Supplies Passed - 12/22/2019  2:23 PM      Passed - Valid encounter within last 12 months    Recent Outpatient Visits          6 months ago Controlled type 2 diabetes mellitus with hyperglycemia, with long-term current use of insulin ([place])   Primary Care at [facility], Zoe A, MD   7 months ago Severe depression ([place])   Primary Care at [facility], Zoe A, MD   11 months ago Nonintractable episodic headache, unspecified headache type   Primary Care at [facility], Arlie Solomons, MD   11 months ago Cough   Primary Care at [facility], Ranell Patrick, MD   1 year ago Controlled type 2 diabetes mellitus with hyperglycemia, with long-term current use of insulin ([place])   Primary Care at [facility], [place] A, MD             . gemfibrozil (LOPID) 600 MG tablet [Pharmacy Med Name: Gemfibrozil 600 MG Oral Tablet] 60 tablet     Sig: TAKE 1 TABLET BY MOUTH TWICE DAILY WITH MEALS     Cardiovascular:  Antilipid - Fibric Acid Derivatives Failed - 12/22/2019  2:23 PM      Failed - HDL in normal range and within 360 days    HDL  Date Value Ref Range Status  05/01/2019 29 (L) >39 mg/dL Final         Failed - Triglycerides in normal range and within 360 days    Triglycerides  Date Value Ref Range Status  05/01/2019 304 (H) 0 - 149 mg/dL Final         Failed - ALT in normal range and within 180 days    ALT  Date Value Ref Range Status  05/01/2019 25 0 - 32 IU/L Final         Failed - AST in normal range and within 180 days    AST  Date Value Ref Range Status  05/01/2019 27 0 - 40 IU/L Final         Failed - Cr in normal range and within 180 days    Creat  Date Value Ref Range Status  10/28/2015 0.41 (L) 0.50 - 1.10 mg/dL Final    Creatinine, Ser  Date Value Ref Range Status  05/01/2019 0.57 0.57 - 1.00 mg/dL Final         Failed - eGFR in normal range and within 180 days    GFR, Est African American  Date Value Ref Range Status  11/14/2014 >89 mL/min Final   GFR calc Af Amer  Date Value Ref Range Status  05/01/2019 127 >59 mL/min/1.73 Final   GFR, Est Non African American  Date Value Ref Range Status  11/14/2014 >89 mL/min Final    Comment:      The estimated GFR is a calculation valid for adults (>=31 years old) that uses the CKD-EPI algorithm to adjust for age and sex. It is   not to be used for children, pregnant women, hospitalized patients,    patients on dialysis, or with rapidly changing kidney function. According to the NKDEP, eGFR >89 is normal, 60-89 shows mild impairment, 30-59 shows moderate impairment, 15-29  shows severe impairment and <15 is ESRD.      GFR calc non Af Amer  Date Value Ref Range Status  05/01/2019 110 >59 mL/min/1.73 Final   GFR  Date Value Ref Range Status  02/16/2013 143.30 >60.00 mL/min Final         Passed - Total Cholesterol in normal range and within 360 days    Cholesterol, Total  Date Value Ref Range Status  05/01/2019 134 100 - 199 mg/dL Final         Passed - LDL in normal range and within 360 days    LDL Chol Calc (NIH)  Date Value Ref Range Status  05/01/2019 57 0 - 99 mg/dL Final   Direct LDL  Date Value Ref Range Status  02/16/2013 86.8 mg/dL Final    Comment:    Optimal:  <100 mg/dLNear or Above Optimal:  100-129 mg/dLBorderline High:  130-159 mg/dLHigh:  160-189 mg/dLVery High:  >190 mg/dL         Passed - Valid encounter within last 12 months    Recent Outpatient Visits          6 months ago Controlled type 2 diabetes mellitus with hyperglycemia, with long-term current use of insulin ([name])   Primary Care at [place], MD   7 months ago Severe depression [name])   Primary Care at [facility], Zoe A, MD   11 months  ago Nonintractable episodic headache, unspecified headache type   Primary Care at [name], Arlie Solomons, MD   11 months ago Cough   Primary Care at [facility], Ranell Patrick, MD   1 year ago Controlled type 2 diabetes mellitus with hyperglycemia, with long-term current use of insulin [name])   Primary Care at [facility], Arlie Solomons, MD

## 2019-12-22 NOTE — Telephone Encounter (Signed)
Requested Prescriptions  Pending Prescriptions Disp Refills  . BD PEN NEEDLE NANO 2ND GEN 32G X 4 MM MISC [Pharmacy Med Name: BD PEN NEEDLE/NANO 32GX4MM MIS] 100 each 1    Sig: USE 1 NEEDLE TO INJECT 4 TIMES DAILY     Endocrinology: Diabetes - Testing Supplies Passed - 12/22/2019  2:23 PM      Passed - Valid encounter within last 12 months    Recent Outpatient Visits          6 months ago Controlled type 2 diabetes mellitus with hyperglycemia, with long-term current use of insulin (Carolyn Butler)   Primary Care at [facility] Carolyn A, MD   7 months ago Severe depression ([place])   Primary Care at [facility], Carolyn A, MD   11 months ago Nonintractable episodic headache, unspecified headache type   Primary Care at [name], Carolyn Solomons, MD   11 months ago Cough   Primary Care at [facility], Carolyn Patrick, MD   1 year ago Controlled type 2 diabetes mellitus with hyperglycemia, with long-term current use of insulin ([name])   Primary Care at [facility], Carol[place] A, MD             . gemfibrozil (LOPID) 600 MG tablet [Pharmacy Med Name: Gemfibrozil 600 MG Oral Tablet] 60 tablet     Sig: TAKE 1 TABLET BY MOUTH TWICE DAILY WITH MEALS     Cardiovascular:  Antilipid - Fibric Acid Derivatives Failed - 12/22/2019  2:23 PM      Failed - HDL in normal range and within 360 days    HDL  Date Value Ref Range Status  05/01/2019 29 (L) >39 mg/dL Final         Failed - Triglycerides in normal range and within 360 days    Triglycerides  Date Value Ref Range Status  05/01/2019 304 (H) 0 - 149 mg/dL Final         Failed - ALT in normal range and within 180 days    ALT  Date Value Ref Range Status  05/01/2019 25 0 - 32 IU/L Final         Failed - AST in normal range and within 180 days    AST  Date Value Ref Range Status  05/01/2019 27 0 - 40 IU/L Final         Failed - Cr in normal range and within 180 days    Creat  Date Value Ref Range Status  10/28/2015 0.41 (L) 0.50 - 1.10 mg/dL Final    Creatinine, Ser  Date Value Ref Range Status  05/01/2019 0.57 0.57 - 1.00 mg/dL Final         Failed - eGFR in normal range and within 180 days    GFR, Est African American  Date Value Ref Range Status  11/14/2014 >89 mL/min Final   GFR calc Af Amer  Date Value Ref Range Status  05/01/2019 127 >59 mL/min/1.73 Final   GFR, Est Non African American  Date Value Ref Range Status  11/14/2014 >89 mL/min Final    Comment:      The estimated GFR is Butler calculation valid for adults (>=57 years old) that uses the CKD-EPI algorithm to adjust for age and sex. It is   not to be used for children, pregnant women, hospitalized patients,    patients on dialysis, or with rapidly changing kidney function. According to the NKDEP, eGFR >89 is normal, 60-89 shows mild impairment, 30-59 shows moderate impairment, 15-29  shows severe impairment and <15 is ESRD.      GFR calc non Af Amer  Date Value Ref Range Status  05/01/2019 110 >59 mL/min/1.73 Final   GFR  Date Value Ref Range Status  02/16/2013 143.30 >60.00 mL/min Final         Passed - Total Cholesterol in normal range and within 360 days    Cholesterol, Total  Date Value Ref Range Status  05/01/2019 134 100 - 199 mg/dL Final         Passed - LDL in normal range and within 360 days    LDL Chol Calc (NIH)  Date Value Ref Range Status  05/01/2019 57 0 - 99 mg/dL Final   Direct LDL  Date Value Ref Range Status  02/16/2013 86.8 mg/dL Final    Comment:    Optimal:  <100 mg/dLNear or Above Optimal:  100-129 mg/dLBorderline High:  130-159 mg/dLHigh:  160-189 mg/dLVery High:  >190 mg/dL         Passed - Valid encounter within last 12 months    Recent Outpatient Visits          6 months ago Controlled type 2 diabetes mellitus with hyperglycemia, with long-term current use of insulin (Carolyn Butler)   Primary Care at [place], MD   7 months ago Severe depression [name])   Primary Care at [facility], Carolyn A, MD   11 months  ago Nonintractable episodic headache, unspecified headache type   Primary Care at [name], Carolyn Solomons, MD   11 months ago Cough   Primary Care at [facility], Carolyn Patrick, MD   1 year ago Controlled type 2 diabetes mellitus with hyperglycemia, with long-term current use of insulin [name])   Primary Care at [facility], Carolyn Solomons, MD

## 2019-12-30 ENCOUNTER — Other Ambulatory Visit: Payer: Self-pay | Admitting: Internal Medicine

## 2020-01-20 ENCOUNTER — Other Ambulatory Visit: Payer: Self-pay | Admitting: Family Medicine

## 2020-01-20 DIAGNOSIS — E781 Pure hyperglyceridemia: Secondary | ICD-10-CM

## 2020-01-20 NOTE — Telephone Encounter (Signed)
Requested Prescriptions  Pending Prescriptions Disp Refills  . gemfibrozil (LOPID) 600 MG tablet [Pharmacy Med Name: Gemfibrozil 600 MG Oral Tablet] 180 tablet 1    Sig: TAKE 1 TABLET BY MOUTH TWICE DAILY WITH MEALS     Cardiovascular:  Antilipid - Fibric Acid Derivatives Failed - 01/20/2020 11:31 AM      Failed - LDL in normal range and within 360 days    LDL Chol Calc (NIH)  Date Value Ref Range Status  05/01/2019 57 0 - 99 mg/dL Final   Direct LDL  Date Value Ref Range Status  02/16/2013 86.8 mg/dL Final    Comment:    Optimal:  <100 mg/dLNear or Above Optimal:  100-129 mg/dLBorderline High:  130-159 mg/dLHigh:  160-189 mg/dLVery High:  >190 mg/dL         Failed - HDL in normal range and within 360 days    HDL  Date Value Ref Range Status  05/01/2019 29 (L) >39 mg/dL Final         Failed - Triglycerides in normal range and within 360 days    Triglycerides  Date Value Ref Range Status  05/01/2019 304 (H) 0 - 149 mg/dL Final         Failed - ALT in normal range and within 180 days    ALT  Date Value Ref Range Status  05/01/2019 25 0 - 32 IU/L Final         Failed - AST in normal range and within 180 days    AST  Date Value Ref Range Status  05/01/2019 27 0 - 40 IU/L Final         Failed - Cr in normal range and within 180 days    Creat  Date Value Ref Range Status  10/28/2015 0.41 (L) 0.50 - 1.10 mg/dL Final   Creatinine, Ser  Date Value Ref Range Status  05/01/2019 0.57 0.57 - 1.00 mg/dL Final         Failed - eGFR in normal range and within 180 days    GFR, Est African American  Date Value Ref Range Status  11/14/2014 >89 mL/min Final   GFR calc Af Amer  Date Value Ref Range Status  05/01/2019 127 >59 mL/min/1.73 Final   GFR, Est Non African American  Date Value Ref Range Status  11/14/2014 >89 mL/min Final    Comment:      The estimated GFR is a calculation valid for adults (>=11 years old) that uses the CKD-EPI algorithm to adjust for age and  sex. It is   not to be used for children, pregnant women, hospitalized patients,    patients on dialysis, or with rapidly changing kidney function. According to the NKDEP, eGFR >89 is normal, 60-89 shows mild impairment, 30-59 shows moderate impairment, 15-29 shows severe impairment and <15 is ESRD.      GFR calc non Af Amer  Date Value Ref Range Status  05/01/2019 110 >59 mL/min/1.73 Final   GFR  Date Value Ref Range Status  02/16/2013 143.30 >60.00 mL/min Final         Passed - Total Cholesterol in normal range and within 360 days    Cholesterol, Total  Date Value Ref Range Status  05/01/2019 134 100 - 199 mg/dL Final         Passed - Valid encounter within last 12 months    Recent Outpatient Visits          7 months ago Controlled type 2 diabetes  mellitus with hyperglycemia, with long-term current use of insulin ([name])   Primary Care at [facility], Zoe A, MD   8 months ago Severe depression [facility])   Primary Care at [place], MD   12 months ago Nonintractable episodic headache, unspecified headache type   Primary Care at [facility], Arlie Solomons, MD   12 months ago Cough   Primary Care at [facility], Ranell Patrick, MD   1 year ago Controlled type 2 diabetes mellitus with hyperglycemia, with long-term current use of insulin [facility])   Primary Care at [facility], Arlie Solomons, MD

## 2020-02-24 ENCOUNTER — Other Ambulatory Visit: Payer: Self-pay | Admitting: Internal Medicine

## 2020-03-25 ENCOUNTER — Other Ambulatory Visit: Payer: Self-pay | Admitting: Internal Medicine

## 2020-03-27 ENCOUNTER — Encounter: Payer: Self-pay | Admitting: Family Medicine

## 2020-03-27 ENCOUNTER — Ambulatory Visit: Payer: BC Managed Care – PPO | Admitting: Internal Medicine

## 2020-03-27 ENCOUNTER — Other Ambulatory Visit: Payer: Self-pay

## 2020-03-27 ENCOUNTER — Ambulatory Visit (INDEPENDENT_AMBULATORY_CARE_PROVIDER_SITE_OTHER): Payer: BC Managed Care – PPO | Admitting: Family Medicine

## 2020-03-27 ENCOUNTER — Ambulatory Visit (INDEPENDENT_AMBULATORY_CARE_PROVIDER_SITE_OTHER): Payer: BC Managed Care – PPO | Admitting: Internal Medicine

## 2020-03-27 ENCOUNTER — Encounter: Payer: Self-pay | Admitting: Internal Medicine

## 2020-03-27 VITALS — BP 131/81 | HR 84 | Temp 97.8°F | Resp 16 | Ht 66.0 in | Wt 194.0 lb

## 2020-03-27 VITALS — BP 160/80 | HR 91 | Ht 66.0 in | Wt 193.0 lb

## 2020-03-27 DIAGNOSIS — Z794 Long term (current) use of insulin: Secondary | ICD-10-CM | POA: Diagnosis not present

## 2020-03-27 DIAGNOSIS — E1165 Type 2 diabetes mellitus with hyperglycemia: Secondary | ICD-10-CM

## 2020-03-27 DIAGNOSIS — E781 Pure hyperglyceridemia: Secondary | ICD-10-CM | POA: Diagnosis not present

## 2020-03-27 DIAGNOSIS — I10 Essential (primary) hypertension: Secondary | ICD-10-CM | POA: Diagnosis not present

## 2020-03-27 DIAGNOSIS — E669 Obesity, unspecified: Secondary | ICD-10-CM

## 2020-03-27 DIAGNOSIS — Z8679 Personal history of other diseases of the circulatory system: Secondary | ICD-10-CM | POA: Diagnosis not present

## 2020-03-27 LAB — COMPREHENSIVE METABOLIC PANEL
ALT: 27 U/L (ref 0–35)
AST: 18 U/L (ref 0–37)
Albumin: 5 g/dL (ref 3.5–5.2)
Alkaline Phosphatase: 28 U/L — ABNORMAL LOW (ref 39–117)
BUN: 11 mg/dL (ref 6–23)
CO2: 24 mEq/L (ref 19–32)
Calcium: 10 mg/dL (ref 8.4–10.5)
Chloride: 99 mEq/L (ref 96–112)
Creatinine, Ser: 0.65 mg/dL (ref 0.40–1.20)
GFR: 96.54 mL/min (ref >60.00)
Glucose, Bld: 131 mg/dL — ABNORMAL HIGH (ref 70–99)
Potassium: 3.9 mEq/L (ref 3.5–5.1)
Sodium: 135 mEq/L (ref 135–145)
Total Bilirubin: 0.4 mg/dL (ref 0.2–1.2)
Total Protein: 8.2 g/dL (ref 6.0–8.3)

## 2020-03-27 LAB — POCT GLYCOSYLATED HEMOGLOBIN (HGB A1C): Hemoglobin A1C: 5.6 % (ref 4.0–5.6)

## 2020-03-27 LAB — LIPID PANEL
Cholesterol: 194 mg/dL (ref 0–200)
HDL: 28.6 mg/dL — ABNORMAL LOW (ref >39.00)
NonHDL: 164.94
Total CHOL/HDL Ratio: 7
Triglycerides: 260 mg/dL — ABNORMAL HIGH (ref 0.0–149.0)
VLDL: 52 mg/dL — ABNORMAL HIGH (ref 0.0–40.0)

## 2020-03-27 LAB — LDL CHOLESTEROL, DIRECT: Direct LDL: 140 mg/dL

## 2020-03-27 LAB — MICROALBUMIN / CREATININE URINE RATIO
Creatinine,U: 142.1 mg/dL
Microalb Creat Ratio: 1.9 mg/g (ref 0.0–30.0)
Microalb, Ur: 2.6 mg/dL — ABNORMAL HIGH (ref 0.0–1.9)

## 2020-03-27 MED ORDER — LOSARTAN POTASSIUM 50 MG PO TABS: 50.0000 mg | ORAL_TABLET | Freq: Every day | ORAL | 3 refills | Status: DC

## 2020-03-27 NOTE — Patient Instructions (Addendum)
  Congratulations on your weight loss.  That is good both for your blood pressure and your diabetes.  With your good blood pressure readings off of medication, I believe that you can safely stop the losartan.  Tell your endocrinologist I have given you permission to do that, though they may still want you on a low-dose of it just for the renal benefits in a diabetic.  I would recommend you get a upper arm blood pressure cuff and monitor your blood pressure readings.  If it is consistently below 140/90 that is satisfactory, with ideal readings being down around 120/70.  However if you are frequently getting elevated readings we should get back on medication or regular basis.  I urged you to get more regular exercise.  I strongly urge you to take your COVID-19 vaccine.  Return in about 6 months or so to follow-up 1 more time with Dr. Neva Seat.   If you have lab work done today you will be contacted with your lab results within the next 2 weeks.  If you have not heard from [place] then please contact us. The fastest way to get your results is to register for My Chart.   IF you received an x-ray today, you will receive an invoice from [facility]. Please contact [facility] at [phone] with questions or concerns regarding your invoice.   IF you received labwork today, you will receive an invoice from [place]. Please contact [facility] at [phone] with questions or concerns regarding your invoice.   Our billing staff will not be able to assist you with questions regarding bills from these companies.  You will be contacted with the lab results as soon as they are available. The fastest way to get your results is to activate your My Chart account. Instructions are located on the last page of this paperwork. If you have not heard from [place] regarding the results in 2 weeks, please contact this office.

## 2020-03-27 NOTE — Patient Instructions (Addendum)
Please continue: - Metformin 1000 mg 2x a day - Lantus 30 units at night - Ozempic to 1 mg weekly  Start: - NovoLog 5-6 units if needed before dinner  Please restart: - Cozaar 50 mg daily  Please return in 6 months with your sugar log.

## 2020-03-27 NOTE — Progress Notes (Signed)
Patient ID: BIJAL SIGLIN, female   DOB: [birth year], 49 y.o.   MRN: [id number]  This visit occurred during the SARS-CoV-2 public health emergency.  Safety protocols were in place, including screening questions prior to the visit, additional usage of staff PPE, and extensive cleaning of exam room while observing appropriate contact time as indicated for disinfecting solutions.   HPI: MERRIT WAUGH is a 49 y.o.-year-old female, returning for f/u for DM2, dx 2005, insulin-dependent since 01/2013, controlled, without long-term complications. Last visit was 4 months ago.  Reviewed HbA1c levels: Lab Results  Component Value Date   HGBA1C 5.5 09/24/2019   HGBA1C 5.6 05/01/2019   HGBA1C 5.4 01/23/2019  Previous A1c was 8.4% in 06/2012.  She has a h/o med noncompliance per PCPs notes.    She is on: - Metformin 1000 mg 2x a day - Lantus 36-40 >> 40-45 >> 40 >> 30-35 >> 30 units at night - NovoLog 16-19 >> 18-20 >> 15-16 >> 16-20 >> 12-16  units before meals >> stopped 05/2019 >> 5-6 units before a large dinner - did not use it since last OV - Ozempic 0.5 mg weekly -added 05/2018 >> tolerating it well now, initially Nausea, HAs >> 1 mg weekly -some mild nausea She tried Toujeo in the past but developed headaches from it. Pt was on Amaryl in the past and gained 15 lbs >> stopped it and lost the weight We stopped Januvia 100 mg in 01/2013 b/c increased TG and high risk of Pancreatitis.   Pt checks her sugars 2-3 times a day: - am:  160s, 197 >> 100-120 >> 111-127 >> 104-114 - 2h after breakfast:206, 240 >> n/c >> 160s >> n/c - before lunch: 88, 99 >> n/c >> 130-162 >> 110-120 >> n/c - 2h after lunch:  151-161 >> 114, 170 >> 143-150 - before dinner: 180, 193, 197 >> n/c >> 110-120 >> n/c - bedtime:   140-190, 227 (after 170) >> 138-166, 184 Lowest sugar was 93 >> 110 >> 104; she has hypoglycemia awareness in the 80s. Highest sugar was 227 >> 184.  Meter: ReliOn  She did a 14 day cleanse since  last OV. She now eats dinner before 7 pm.  No CKD, last BUN/creatinine was:  Lab Results  Component Value Date   BUN 12 05/01/2019   CREATININE 0.57 05/01/2019  Prev. On losartan (but off now for 1.5 mo >> developed palpitations and BP 187/?, P 103).  + HL; Last set of lipids: Lab Results  Component Value Date   CHOL 134 05/01/2019   HDL 29 (L) 05/01/2019   LDLCALC 57 05/01/2019   LDLDIRECT 86.8 02/16/2013   TRIG 304 (H) 05/01/2019   CHOLHDL 4.6 (H) 05/01/2019   Prev: Lab Results  Component Value Date   CHOL 170 05/09/2013   HDL 28* 05/09/2013   LDLCALC Comment:   Not calculated due to Triglyceride >400.  05/09/2013   LDLDIRECT 86.8 02/16/2013   TRIG 790* 05/09/2013   CHOLHDL 6.1 05/09/2013  On omega-3 fatty acids, gemfibrozil, Lipitor  Pt's last eye exam was in 04/2019: No DR No numbness and tingling in her legs.   She also has HTN. She has a history of bruxism.  Her father and sister had COVID-19.  They are both in [place].  They recovered well.  ROS: Constitutional: no weight gain/no weight loss, no fatigue, no subjective hyperthermia, no subjective hypothermia Eyes: no blurry vision, no xerophthalmia ENT: no sore throat, no nodules palpated in neck, no  dysphagia, no odynophagia, no hoarseness Cardiovascular: no CP/no SOB/no palpitations/no leg swelling Respiratory: no cough/no SOB/no wheezing Gastrointestinal: no N/no V/no D/no C/no acid reflux Musculoskeletal: no muscle aches/no joint aches Skin: no rashes, no hair loss Neurological: no tremors/no numbness/no tingling/no dizziness  I reviewed pt's medications, allergies, PMH, social hx, family hx, and changes were documented in the history of present illness. Otherwise, unchanged from my initial visit note.   Past Medical History:  Diagnosis Date  . Diabetes mellitus without complication (HCC)   . Hypertension    Past Surgical History:  Procedure Laterality Date  . surgery on right knee     Social  History   Socioeconomic History  . Marital status: Married    Spouse name: Enes Wegener  . Number of children: 0  . Years of education: 12+  . Highest education level: Not on file  Occupational History  . Occupation: unemployed    Comment: Psychologist, occupational  Tobacco Use  . Smoking status: Never Smoker  . Smokeless tobacco: Never Used  Substance and Sexual Activity  . Alcohol use: No    Comment: twice a year mixed drink or red wine  . Drug use: No  . Sexual activity: Yes  Other Topics Concern  . Not on file  Social History Narrative   Lives with her husband.   Laid-off from job at [employer] (2014), and hasn't returned to work.   Social Determinants of Health   Financial Resource Strain:   . Difficulty of Paying Living Expenses: Not on file  Food Insecurity:   . Worried About Programme researcher, broadcasting/film/video in the Last Year: Not on file  . Ran Out of Food in the Last Year: Not on file  Transportation Needs:   . Lack of Transportation (Medical): Not on file  . Lack of Transportation (Non-Medical): Not on file  Physical Activity:   . Days of Exercise per Week: Not on file  . Minutes of Exercise per Session: Not on file  Stress:   . Feeling of Stress : Not on file  Social Connections:   . Frequency of Communication with Friends and Family: Not on file  . Frequency of Social Gatherings with Friends and Family: Not on file  . Attends Religious Services: Not on file  . Active Member of Clubs or Organizations: Not on file  . Attends Banker Meetings: Not on file  . Marital Status: Not on file  Intimate Partner Violence:   . Fear of Current or Ex-Partner: Not on file  . Emotionally Abused: Not on file  . Physically Abused: Not on file  . Sexually Abused: Not on file   Current Outpatient Medications on File Prior to Visit  Medication Sig Dispense Refill  . aspirin 81 MG tablet Take 81 mg by mouth daily.    . BD PEN NEEDLE NANO 2ND GEN 32G X 4 MM MISC USE 1 NEEDLE TO INJECT 4  TIMES DAILY 100 each 1  . cyclobenzaprine (FLEXERIL) 10 MG tablet TAKE ONE TABLET BY MOUTH AT BEDTIME AS NEEDED FOR MUSCLE SPASM 60 tablet 1  . escitalopram (LEXAPRO) 10 MG tablet Take 1 tablet (10 mg total) by mouth daily. Take 1/2 tablet by mouth for one week. Then increase to one tablet by mouth daily. (Patient not taking: Reported on 05/29/2019) 26 tablet 0  . escitalopram (LEXAPRO) 10 MG tablet Take 1 tablet (10 mg total) by mouth daily. (Patient not taking: Reported on 05/29/2019) 30 tablet 2  . gemfibrozil (LOPID)  600 MG tablet TAKE 1 TABLET BY MOUTH TWICE DAILY WITH MEALS 180 tablet 1  . glucose blood (ONE TOUCH ULTRA TEST) test strip Use to test blood sugar 3 times daily as instructed. 100 each 11  . insulin aspart (NOVOLOG FLEXPEN) 100 UNIT/ML FlexPen INJECT 15-16 UNITS SUBCUTANEOUSLY  THREE TIMES DAILY (Patient not taking: Reported on 09/24/2019) 30 mL 3  . Insulin Glargine (LANTUS SOLOSTAR) 100 UNIT/ML Solostar Pen INJECT 30 UNITS INTO THE SKIN EACH NIGHT AT BEDTIME 30 mL 3  . Lancets (ONETOUCH ULTRASOFT) lancets Use as instructed. Test blood glucose bid. E11.9 100 each 12  . losartan (COZAAR) 50 MG tablet Take 1 tablet by mouth once daily 90 tablet 0  . metFORMIN (GLUCOPHAGE) 1000 MG tablet Take 1 tablet (1,000 mg total) by mouth 2 (two) times daily. 180 tablet 3  . Multiple Vitamin (MULTIVITAMIN) tablet Take 1 tablet by mouth daily.    . Omega-3 Fatty Acids (OMEGA 3 PO) Take by mouth 2 (two) times daily with a meal.    . omeprazole (PRILOSEC) 20 MG capsule Take 1 capsule by mouth once daily 90 capsule 2  . OZEMPIC, 1 MG/DOSE, 2 MG/1.5ML SOPN INJECT 1 MG INTO THE SKIN ONCE WEEKLY 4 mL 1  . OZEMPIC, 1 MG/DOSE, 4 MG/3ML SOPN INJECT 1 MG INTO THE SKIN ONCE WEEKLY 3 mL 0  . VITAMIN E PO Take by mouth daily.     No current facility-administered medications on file prior to visit.   Allergies  Allergen Reactions  . Doxycycline    Family History  Problem Relation Age of Onset  . Diabetes  Father   . Heart disease Father   . Hyperlipidemia Father   . Diabetes Paternal Grandmother   . Heart disease Paternal Grandmother   . Diabetes Brother     PE: BP (!) 160/80   Pulse 91   Ht 5\' 6"  (1.676 m)   Wt 193 lb (87.5 kg)   SpO2 98%   BMI 31.15 kg/m  Body mass index is 31.15 kg/m. Wt Readings from Last 3 Encounters:  03/27/20 193 lb (87.5 kg)  03/27/20 194 lb (88 kg)  09/24/19 199 lb (90.3 kg)   Constitutional: overweight, in NAD Eyes: PERRLA, EOMI, no exophthalmos ENT: moist mucous membranes, no thyromegaly, no cervical lymphadenopathy Cardiovascular: RRR, No MRG Respiratory: CTA B Gastrointestinal: abdomen soft, NT, ND, BS+ Musculoskeletal: no deformities, strength intact in all 4 Skin: moist, warm, no rashes Neurological: no tremor with outstretched hands, DTR normal in all 4  ASSESSMENT: 1. DM2, insulin-dependent, controlled, without long term complications  2. HTG  3.  Obesity class II  PLAN:  1.  Patient with history of good diabetes control with HbA1c levels at goal, on Metformin, basal insulin and weekly GLP-1 receptor agonist-used mostly for his weight loss effects, and rapid acting insulin as needed before dinner-added at last visit. -At last visit she described some mild nausea with Ozempic and occasional headaches, but she wanted to continue with the medication.  At that time sugars were at goal with only few hyperglycemic spikes after larger dinners and we discussed about using some NovoLog before these meals, as needed.  Otherwise, she continued to lose weight and her sugars were at goal so we continued the same regimen. -At today's visit, sugars are mostly at goal, with only occasional spikes in the mild hyperglycemic range.  Upon questioning, she did not use NovoLog for larger dinners and we again discussed that she may need to do so  especially with the holiday season approaching.  Otherwise, since she does not have significant hyperglycemia or any  hypoglycemia, we will continue the current regimen. - I advised her to:  Patient Instructions  Please continue: - Metformin 1000 mg 2x a day - Lantus 30 units at night - Ozempic to 1 mg weekly  Start: - NovoLog 5-6 units if needed before dinner  Please restart: - Cozaar 50 mg daily  Please return in 6 months with your sugar log.   - we checked her HbA1c: 5.6% (stable) - advised to check sugars at different times of the day - 1-3x a day, rotating check times - advised for yearly eye exams >> she is UTD - will check CMP and ACR today - return to clinic in 6 months   2. Hypertriglyceridemia -Reviewed latest lipid panel from 04/2019: LDL at goal, triglycerides high, HDL low: Lab Results  Component Value Date   CHOL 134 05/01/2019   HDL 29 (L) 05/01/2019   LDLCALC 57 05/01/2019   LDLDIRECT 86.8 02/16/2013   TRIG 304 (H) 05/01/2019   CHOLHDL 4.6 (H) 05/01/2019  -She continues Lipitor 10 mg daily, Lopid 600 mg twice a day, omega-3 fatty acids  - ? dose  No side effects from the regimen.  She was omega-3 fatty acids before the above lipid panel being checked.  She is now back on it but takes a lower dose, and a multivitamin.  I advised her that we may need to increase this to 2000 mg twice a day after the results today return. - will recheck today (nonfasting)  3.  Obesity class II -Continues Ozempic which should also help with weight loss.  She initially lost 15 pounds after starting the medication and 8 more pounds before last visit. -lost 6 lbs since last OV  4. HTN -She ran out of Cozaar after her previous PCP left practice.  She has been off now for a month and a half. -She had an episode of palpitations at home with an associated systolic blood pressure of 187.  At today's visit, her blood pressure is 160/80. -We will go ahead and restart her Cozaar at 50 mg daily.  We discussed that she should continue to check her blood sugars at home and we may need to back off the dose if  this decreases too much but for now, I would continue with the entire dose.  Component     Latest Ref Rng & Units 03/27/2020  Sodium     135 - 145 mEq/L 135  Potassium     3.5 - 5.1 mEq/L 3.9  Chloride     96 - 112 mEq/L 99  CO2     19 - 32 mEq/L 24  Glucose     70 - 99 mg/dL 937 (H)  BUN     6 - 23 mg/dL 11  Creatinine     1.69 - 1.20 mg/dL 6.78  Total Bilirubin     0.2 - 1.2 mg/dL 0.4  Alkaline Phosphatase     39 - 117 U/L 28 (L)  AST     0 - 37 U/L 18  ALT     0 - 35 U/L 27  Total Protein     6.0 - 8.3 g/dL 8.2  Albumin     3.5 - 5.2 g/dL 5.0  GFR     >93.81 mL/min 96.54  Calcium     8.4 - 10.5 mg/dL 01.7  Cholesterol     0 -  200 mg/dL 947  Triglycerides     0 - 149 mg/dL 096.2 (H)  HDL Cholesterol     >39.00 mg/dL 83.66 (L)  VLDL     0.0 - 40.0 mg/dL 29.4 (H)  Total CHOL/HDL Ratio      7  NonHDL      164.94  Microalb, Ur     0.0 - 1.9 mg/dL 2.6 (H)  Creatinine,U     mg/dL 765.4  MICROALB/CREAT RATIO     0.0 - 30.0 mg/g 1.9  Direct LDL     mg/dL 650.3   Triglycerides are better, but still above target.  Will advise her to switch to 2000 mg fish oil twice a day. LDL is much higher than before.  Will emphasize the need to take her statin every day.  Carlus Pavlov, MD PhD [facility]

## 2020-03-27 NOTE — Progress Notes (Signed)
Patient ID: Carolyn Butler, female    DOB: [birth year]  Age: 49 y.o. MRN: [id number]  Chief Complaint  Patient presents with  . Hypertension    pt doing well BP has been good denies physical symptoms, pt notes Losartan has been out for 1 month and BP has remained within the normal range     Subjective:   Patient is here for follow-up of her blood pressure.  She has been out of her losartan for over a month.  Pressure has been running well except for a single time Monday when she was not feeling good and had a heart racing sensation.  She took her blood pressure and it was significantly elevated then.  Her heart rate was 150 and her blood pressure was systolic 170.  That is the only time it is done that.  She is not sure what triggered that whole episode.  She has lost a lot of weight.  She does not get a lot of regular exercise.  She does see her endocrinologist and her diabetes has been in good control.    Current allergies, medications, problem list, past/family and social histories reviewed.  Objective:  BP 131/81   Pulse 84   Temp 97.8 F (36.6 C) (Temporal)   Resp 16   Ht 5\' 6"  (1.676 m)   Wt 194 lb (88 kg)   SpO2 98%   BMI 31.31 kg/m   She has done at least 27 pounds from the high of her weight.  Alert and oriented.  No acute distress.  Heart regular without murmur. Assessment & Plan:   Assessment: 1. History of high blood pressure   2. Controlled type 2 diabetes mellitus with hyperglycemia, with long-term current use of insulin (HCC)       Plan: With her weight loss I think she can come off the blood pressure medicine at this time.  That is unless the endocrinologist wants her on it for the renal protective value.  She should see Dr. back in about 6 months.  No orders of the defined types were placed in this encounter.   No orders of the defined types were placed in this encounter.        Patient Instructions    Congratulations on your weight loss.   That is good both for your blood pressure and your diabetes.  With your good blood pressure readings off of medication, I believe that you can safely stop the losartan.  Tell your endocrinologist I have given you permission to do that, though they may still want you on a low-dose of it just for the renal benefits in a diabetic.  I would recommend you get a upper arm blood pressure cuff and monitor your blood pressure readings.  If it is consistently below 140/90 that is satisfactory, with ideal readings being down around 120/70.  However if you are frequently getting elevated readings we should get back on medication or regular basis.  I urged you to get more regular exercise.  I strongly urge you to take your COVID-19 vaccine.  Return in about 6 months or so to follow-up 1 more time with Dr. Neva Seat.   If you have lab work done today you will be contacted with your lab results within the next 2 weeks.  If you have not heard from Neva Seat then please contact us. The fastest way to get your results is to register for My Chart.   IF you received an x-ray today, you will  receive an Economist from [facility]. Please contact [facility] at [phone] with questions or concerns regarding your invoice.   IF you received labwork today, you will receive an invoice from [facility]. Please contact [facility] at [phone] with questions or concerns regarding your invoice.   Our billing staff will not be able to assist you with questions regarding bills from these companies.  You will be contacted with the lab results as soon as they are available. The fastest way to get your results is to activate your My Chart account. Instructions are located on the last page of this paperwork. If you have not heard from [place] regarding the results in 2 weeks, please contact this office.        Return in about 6 months (around 09/24/2020), or Dr. Neva Seat, for Blood pressure follow-up.   Janace Hoard, MD  03/27/2020

## 2020-04-02 ENCOUNTER — Other Ambulatory Visit: Payer: Self-pay | Admitting: Family Medicine

## 2020-04-02 NOTE — Telephone Encounter (Signed)
Requested Prescriptions  Pending Prescriptions Disp Refills  . BD PEN NEEDLE NANO 2ND GEN 32G X 4 MM MISC [Pharmacy Med Name: BD PEN NEEDLE/NANO 32GX4MM MIS] 100 each 6    Sig: USE 1  4 TIMES DAILY     Endocrinology: Diabetes - Testing Supplies Passed - 04/02/2020 11:38 AM      Passed - Valid encounter within last 12 months    Recent Outpatient Visits          6 days ago History of high blood pressure   Primary Care at [facility], Sandria Bales, MD   10 months ago Controlled type 2 diabetes mellitus with hyperglycemia, with long-term current use of insulin [facility])   Primary Care at [facility], Zoe A, MD   11 months ago Severe depression [facility])   Primary Care at [facility], Manus Rudd, MD   1 year ago Nonintractable episodic headache, unspecified headache type   Primary Care at [facility], Manus Rudd, MD   1 year ago Cough   Primary Care at [facility], Asencion Partridge, MD      Future Appointments            In 2 months Neva Seat Asencion Partridge, MD Primary Care at [place]

## 2020-04-21 ENCOUNTER — Other Ambulatory Visit: Payer: Self-pay | Admitting: Internal Medicine

## 2020-05-05 ENCOUNTER — Ambulatory Visit: Payer: BC Managed Care – PPO | Admitting: Internal Medicine

## 2020-05-19 ENCOUNTER — Other Ambulatory Visit: Payer: Self-pay | Admitting: Internal Medicine

## 2020-06-16 ENCOUNTER — Other Ambulatory Visit: Payer: Self-pay | Admitting: Internal Medicine

## 2020-06-20 ENCOUNTER — Encounter: Payer: BC Managed Care – PPO | Admitting: Family Medicine

## 2020-06-23 ENCOUNTER — Other Ambulatory Visit: Payer: Self-pay | Admitting: Internal Medicine

## 2020-07-04 ENCOUNTER — Ambulatory Visit (INDEPENDENT_AMBULATORY_CARE_PROVIDER_SITE_OTHER): Payer: BC Managed Care – PPO | Admitting: Family Medicine

## 2020-07-04 ENCOUNTER — Other Ambulatory Visit: Payer: Self-pay

## 2020-07-04 ENCOUNTER — Encounter: Payer: Self-pay | Admitting: Family Medicine

## 2020-07-04 VITALS — BP 123/80 | HR 85 | Temp 98.0°F | Ht 66.0 in | Wt 193.0 lb

## 2020-07-04 DIAGNOSIS — Z1211 Encounter for screening for malignant neoplasm of colon: Secondary | ICD-10-CM

## 2020-07-04 DIAGNOSIS — M67432 Ganglion, left wrist: Secondary | ICD-10-CM | POA: Diagnosis not present

## 2020-07-04 DIAGNOSIS — Z7185 Encounter for immunization safety counseling: Secondary | ICD-10-CM

## 2020-07-04 DIAGNOSIS — E119 Type 2 diabetes mellitus without complications: Secondary | ICD-10-CM | POA: Diagnosis not present

## 2020-07-04 DIAGNOSIS — I10 Essential (primary) hypertension: Secondary | ICD-10-CM

## 2020-07-04 DIAGNOSIS — E781 Pure hyperglyceridemia: Secondary | ICD-10-CM

## 2020-07-04 DIAGNOSIS — K219 Gastro-esophageal reflux disease without esophagitis: Secondary | ICD-10-CM

## 2020-07-04 DIAGNOSIS — M62830 Muscle spasm of back: Secondary | ICD-10-CM

## 2020-07-04 DIAGNOSIS — Z794 Long term (current) use of insulin: Secondary | ICD-10-CM

## 2020-07-04 MED ORDER — LOSARTAN POTASSIUM 50 MG PO TABS: 50.0000 mg | ORAL_TABLET | Freq: Every day | ORAL | 5 refills | Status: DC

## 2020-07-04 MED ORDER — ATORVASTATIN CALCIUM 10 MG PO TABS: ORAL_TABLET | ORAL | 2 refills | Status: DC

## 2020-07-04 MED ORDER — CYCLOBENZAPRINE HCL 5 MG PO TABS: 5.0000 mg | ORAL_TABLET | Freq: Three times a day (TID) | ORAL | 1 refills | Status: DC | PRN

## 2020-07-04 NOTE — Progress Notes (Signed)
Subjective:  Patient ID: Carolyn Butler, female    DOB: 1971/06/06  Age: 49 y.o. MRN: [id number]  CC:  Chief Complaint  Patient presents with  . Establish Care    Pt reports she feels good, but wanted the provider to look at a mass that has formed back in October. Pt thinks it may be a cyst. Pt reports the mass itches once in a while, but no pain. Pt needs a routine order for a mammogram.     HPI Carolyn Butler presents for   New patient to establish care, transferring from Dr. Creta Levin as her previous primary care provider.  Diabetes: She is followed by Dr. Elvera Lennox for diabetes.  Appointment in September.  Treated with NovoLog, Lantus, Metformin, and ozempic. Not needing novolog.  Lab Results  Component Value Date   HGBA1C 5.6 03/27/2020   Hyperlipidemia: Gemfibrozil 600 mg twice daily.  Muscle cramps previously with statin daily.  Had been on Lipitor 10 mg daily.  LDL 140 on September 2.  Lab Results  Component Value Date   CHOL 194 03/27/2020   HDL 28.60 (L) 03/27/2020   LDLCALC 57 05/01/2019   LDLDIRECT 140.0 03/27/2020   TRIG 260.0 (H) 03/27/2020   CHOLHDL 7 03/27/2020   Lab Results  Component Value Date   ALT 27 03/27/2020   AST 18 03/27/2020   ALKPHOS 28 (L) 03/27/2020   BILITOT 0.4 03/27/2020   Hypertension: Losartan 50 mg daily. Home readings: rare.  No new side effects with meds.   BP Readings from Last 3 Encounters:  07/04/20 123/80  03/27/20 (!) 160/80  03/27/20 131/81   Lab Results  Component Value Date   CREATININE 0.65 03/27/2020   Cyst on wrist: Noticed in October, itches at times? Not currently. No pain, no limitations, but feels some with yoga poses - downward dog with pressure on wrist.  Omeprazole daily controls heartburn.   Health maintenance Due for mammogram: last one in July 2019. Tenderness at that time. No new tenderness or new symptoms. R breast simple cyst. Ultrasound at that time.  Requests screening test.  Due for  colonoscopy:  Paternal uncle with colon CA. No immediate family members. Agrees to referral.  Pneumovax: declines.  Covid vaccine: did not get, not planning on getting it, but still considering.  Flu vaccine: declines.   Back muscle spasm: Episodic low to mid back after workout or working in the yard, episodic use of mm relaxant as needed in past. Refill needed of flexeril (last one in 06/2018). Makes drowsy.  No bowel or bladder incontinence, no saddle anesthesia, no lower extremity weakness.    History Patient Active Problem List   Diagnosis Date Noted  . Tension headache 07/06/2018  . Muscle spasm 07/06/2018  . Controlled diabetes mellitus with long-term current use of insulin (HCC) 08/14/2015  . Pseudohyponatremia 12/14/2012  . Hypertriglyceridemia 12/14/2012  . Benign essential HTN 11/20/2011  . Obesity, Class II, BMI 35-39.9 11/20/2011  . Sleep disturbance 11/20/2011   Past Medical History:  Diagnosis Date  . Diabetes mellitus without complication (HCC)   . Hypertension    Past Surgical History:  Procedure Laterality Date  . surgery on right knee     Allergies  Allergen Reactions  . Doxycycline    Prior to Admission medications   Medication Sig Start Date End Date Taking? Authorizing Provider  aspirin 81 MG tablet Take 81 mg by mouth daily.   Yes [provider]  BD PEN NEEDLE NANO 2ND GEN  32G X 4 MM MISC USE 1  4 TIMES DAILY 04/02/20  Yes Shade Flood, MD  cholecalciferol (VITAMIN D3) 25 MCG (1000 UNIT) tablet Take 1,000 Units by mouth daily.   Yes [provider]  cyclobenzaprine (FLEXERIL) 10 MG tablet TAKE ONE TABLET BY MOUTH AT BEDTIME AS NEEDED FOR MUSCLE SPASM 07/06/18  Yes Stallings, Zoe A, MD  gemfibrozil (LOPID) 600 MG tablet TAKE 1 TABLET BY MOUTH TWICE DAILY WITH MEALS 01/20/20  Yes Cannady, Jolene T, NP  glucose blood (ONE TOUCH ULTRA TEST) test strip Use to test blood sugar 3 times daily as instructed. 11/26/13  Yes Carlus Pavlov,  MD  insulin aspart (NOVOLOG FLEXPEN) 100 UNIT/ML FlexPen INJECT 15-16 UNITS SUBCUTANEOUSLY  THREE TIMES DAILY Patient taking differently: INJECT 15-16 UNITS SUBCUTANEOUSLY  THREE TIMES DAILY 05/25/19  Yes Carlus Pavlov, MD  Insulin Glargine (LANTUS SOLOSTAR) 100 UNIT/ML Solostar Pen INJECT 30 UNITS INTO THE SKIN EACH NIGHT AT BEDTIME 05/25/19  Yes Carlus Pavlov, MD  Lancets [facility] ULTRASOFT) lancets Use as instructed. Test blood glucose bid. E11.9 11/09/18  Yes Stallings, Zoe A, MD  losartan (COZAAR) 50 MG tablet Take 1 tablet (50 mg total) by mouth daily. 03/27/20  Yes Carlus Pavlov, MD  metFORMIN (GLUCOPHAGE) 1000 MG tablet Take 1 tablet (1,000 mg total) by mouth 2 (two) times daily. 05/25/19  Yes Carlus Pavlov, MD  Multiple Vitamin (MULTIVITAMIN) tablet Take 1 tablet by mouth daily.   Yes [provider]  omeprazole (PRILOSEC) 20 MG capsule Take 1 capsule by mouth once daily 11/17/19  Yes Stallings, Zoe A, MD  OZEMPIC, 1 MG/DOSE, 4 MG/3ML SOPN INJECT 1 MG INTO THE SKIN ONCE WEEKLY 06/23/20  Yes Carlus Pavlov, MD  VITAMIN E PO Take by mouth daily.   Yes [provider]   Social History   Socioeconomic History  . Marital status: Married    Spouse name: Irish Breisch  . Number of children: 0  . Years of education: 12+  . Highest education level: Not on file  Occupational History  . Occupation: unemployed    Comment: Psychologist, occupational  Tobacco Use  . Smoking status: Never Smoker  . Smokeless tobacco: Never Used  Substance and Sexual Activity  . Alcohol use: No    Comment: twice a year mixed drink or red wine  . Drug use: No  . Sexual activity: Yes  Other Topics Concern  . Not on file  Social History Narrative   Lives with her husband.   Laid-off from job at [employer] (2014), and hasn't returned to work.   Social Determinants of Health   Financial Resource Strain: Not on file  Food Insecurity: Not on file  Transportation Needs: Not on file   Physical Activity: Not on file  Stress: Not on file  Social Connections: Not on file  Intimate Partner Violence: Not on file    Review of Systems   Objective:   Vitals:   07/04/20 1111  BP: 123/80  Pulse: 85  Temp: 98 F (36.7 C)  TempSrc: Temporal  SpO2: 99%  Weight: 193 lb (87.5 kg)  Height: 5\' 6"  (1.676 m)     Physical Exam Vitals reviewed.  Constitutional:      General: She is not in acute distress.    Appearance: She is well-developed and well-nourished.  HENT:     Head: Normocephalic and atraumatic.  Eyes:     Extraocular Movements: EOM normal.     Conjunctiva/sclera: Conjunctivae normal.     Pupils: Pupils  are equal, round, and reactive to light.  Neck:     Vascular: No carotid bruit.  Cardiovascular:     Rate and Rhythm: Normal rate and regular rhythm.     Pulses: Intact distal pulses.     Heart sounds: Normal heart sounds.  Pulmonary:     Effort: Pulmonary effort is normal.     Breath sounds: Normal breath sounds.  Abdominal:     Palpations: Abdomen is soft. There is no pulsatile mass.     Tenderness: There is no abdominal tenderness.  Musculoskeletal:     Comments: Right wrist, small cystic-appearing structure approximately 5 to 6 mm overlying the radial artery, nonpulsatile, mobile.  No focal bony tenderness.  Full range of motion of wrist.    Skin:    General: Skin is warm and dry.  Neurological:     Mental Status: She is alert and oriented to person, place, and time.  Psychiatric:        Mood and Affect: Mood and affect normal.        Behavior: Behavior normal.    44 minutes spent during visit, greater than 50% counseling and assimilation of information, chart review, and discussion of plan.   Assessment & Plan:  Carolyn Butler is a 49 y.o. female . Ganglion of left wrist  -Possible ganglion cyst, minimal symptoms.  Based on location recommend evaluation with hand specialist for possible aspiration.  Defers referral at this time, but if  she calls back can place referral without office visit.  Special screening for malignant neoplasms, colon - Plan: Ambulatory referral to Gastroenterology  -Refer to gastroenterology for colonoscopy  Controlled type 2 diabetes mellitus without complication, with long-term current use of insulin (HCC)  -Well-controlled, under care of endocrinology.  Is on ARB, no current statin, will try restarting as below  Hypertriglyceridemia with elevated LDL recently  -We will try restarting low-dose statin initially once per week then increase by 1 dose per week if tolerated.  Discussed potential risks with use of gemfibrozil, but may be able to decrease dosing if tolerating statin.  RTC precautions.  Essential hypertension Benign essential HTN - Plan: losartan (COZAAR) 50 MG tablet  -Stable, tolerating current regimen, refill losartan at 30-day supply as requested per insurance requirements.  Vaccine counseling  -Covid vaccine discussed as well as questions answered.  Discussed increased risk of complications given her history of diabetes, bmi.  Still defers at this time but will continue to consider.  Muscle spasm of back - Plan: cyclobenzaprine (FLEXERIL) 5 MG tablet  Intermittent back spasms, stable.  Trial of low-dose Flexeril as may have less sedation and just effective treatment.  RTC precautions if recurrent/persistent for further evaluation.  Gastroesophageal reflux disease, unspecified whether esophagitis present  -Stable with PPI use, handout given on trigger avoidance with RTC precautions.  Meds ordered this encounter  Medications  . atorvastatin (LIPITOR) 10 MG tablet    Sig: Take 1 by mouth 2 days per week.    Dispense:  30 tablet    Refill:  2  . losartan (COZAAR) 50 MG tablet    Sig: Take 1 tablet (50 mg total) by mouth daily.    Dispense:  30 tablet    Refill:  5  . cyclobenzaprine (FLEXERIL) 5 MG tablet    Sig: Take 1-2 tablets (5-10 mg total) by mouth 3 (three) times daily as  needed for muscle spasms.    Dispense:  15 tablet    Refill:  1  Patient Instructions    Try restarting statin once per week, then increase by one per week if tolerated. If return of muscle aches, return to lower dosing or stop if not resolved. If tolerating lipitor, may be able to cut back on gemfibrozil.   We recommend that you schedule a mammogram for breast cancer screening. Typically, you do not need a referral to do this. Ask them question about this being a screening mammogram.  [facility]) - [phone] or [phone]   I will refer you for colonoscopy. Omeprazole up to once per day for heartburn. See food info below.   Return to the clinic or go to the nearest emergency room if any of your symptoms worsen or new symptoms occur.  Area on wrist likely a cyst. Based on location, would recommend hand specialist evaluate that area for possible drainage. Let me know if you would like me to refer you.   Flexeril refilled if needed for flares and muscle spasm of back, try the lower dose 1-2 as needed.  Follow-up if more frequent use of that medication or need.  Follow-up if any change in your back symptoms.  Return to the clinic or go to the nearest emergency room if any of your symptoms worsen or new symptoms occur.    Food Choices for Gastroesophageal Reflux Disease, Adult When you have gastroesophageal reflux disease (GERD), the foods you eat and your eating habits are very important. Choosing the right foods can help ease the discomfort of GERD. Consider working with a diet and nutrition specialist (dietitian) to help you make healthy food choices. What general guidelines should I follow?  Eating plan  Choose healthy foods low in fat, such as fruits, vegetables, whole grains, low-fat dairy products, and lean meat, fish, and poultry.  Eat frequent, small meals instead of three large meals each day. Eat your meals slowly, in a relaxed setting.  Avoid bending over or lying down until 2-3 hours after eating.  Limit high-fat foods such as fatty meats or fried foods.  Limit your intake of oils, butter, and shortening to less than 8 teaspoons each day.  Avoid the following: ? Foods that cause symptoms. These may be different for different people. Keep a food diary to keep track of foods that cause symptoms. ? Alcohol. ? Drinking large amounts of liquid with meals. ? Eating meals during the 2-3 hours before bed.  Cook foods using methods other than frying. This may include baking, grilling, or broiling. Lifestyle  Maintain a healthy weight. Ask your health care provider what weight is healthy for you. If you need to lose weight, work with your health care provider to do so safely.  Exercise for at least 30 minutes on 5 or more days each week, or as told by your health care provider.  Avoid wearing clothes that fit tightly around your waist and chest.  Do not use any products that contain nicotine or tobacco, such as cigarettes and e-cigarettes. If you need help quitting, ask your health care provider.  Sleep with the head of your bed raised. Use a wedge under the mattress or blocks under the bed frame to raise the head of the bed. What foods are not recommended? The items listed may not be a complete list. Talk with your dietitian about what dietary choices are best for you. Grains Pastries or quick breads with added fat. [place] toast. Vegetables Deep fried vegetables. [place] fries. Any vegetables prepared with added fat.  Any vegetables that cause symptoms. For some people this may include tomatoes and tomato products, chili peppers, onions and garlic, and horseradish. Fruits Any fruits prepared with added fat. Any fruits that cause symptoms. For some people this may include citrus fruits, such as oranges, grapefruit, pineapple, and lemons. Meats and other protein foods High-fat meats, such as fatty beef or pork, hot dogs, ribs,  ham, sausage, salami and bacon. Fried meat or protein, including fried fish and fried chicken. Nuts and nut butters. Dairy Whole milk and chocolate milk. Sour cream. Cream. Ice cream. Cream cheese. Milk shakes. Beverages Coffee and tea, with or without caffeine. Carbonated beverages. Sodas. Energy drinks. Fruit juice made with acidic fruits (such as orange or grapefruit). Tomato juice. Alcoholic drinks. Fats and oils Butter. Margarine. Shortening. Ghee. Sweets and desserts Chocolate and cocoa. Donuts. Seasoning and other foods Pepper. Peppermint and spearmint. Any condiments, herbs, or seasonings that cause symptoms. For some people, this may include curry, hot sauce, or vinegar-based salad dressings. Summary  When you have gastroesophageal reflux disease (GERD), food and lifestyle choices are very important to help ease the discomfort of GERD.  Eat frequent, small meals instead of three large meals each day. Eat your meals slowly, in a relaxed setting. Avoid bending over or lying down until 2-3 hours after eating.  Limit high-fat foods such as fatty meat or fried foods. This information is not intended to replace advice given to you by your health care provider. Make sure you discuss any questions you have with your health care provider. Document Revised: 11/02/2018 Document Reviewed: 07/13/2016 Elsevier Patient Education  [employer]2020 [employer]    If you have lab work done today you will be contacted with your lab results within the next 2 weeks.  If you have not heard from us then please contact us. The fastest way to get your results is to register for My Chart.   IF you received an x-ray today, you will receive an invoice from [facility]. Please contact [facility] at [phone] with questions or concerns regarding your invoice.   IF you received labwork today, you will receive an invoice from [facility]. Please contact [facility] at [phone] with questions or  concerns regarding your invoice.   Our billing staff will not be able to assist you with questions regarding bills from these companies.  You will be contacted with the lab results as soon as they are available. The fastest way to get your results is to activate your My Chart account. Instructions are located on the last page of this paperwork. If you have not heard from us regarding the results in 2 weeks, please contact this office.         Signed, Meredith StaggersJeffrey Santrice Muzio, MD [facility]

## 2020-07-04 NOTE — Patient Instructions (Addendum)
Try restarting statin once per week, then increase by one per week if tolerated. If return of muscle aches, return to lower dosing or stop if not resolved. If tolerating lipitor, may be able to cut back on gemfibrozil.   We recommend that you schedule a mammogram for breast cancer screening. Typically, you do not need a referral to do this. Ask them question about this being a screening mammogram.  [facility]) - [phone] or [phone]   I will refer you for colonoscopy. Omeprazole up to once per day for heartburn. See food info below.   Return to the clinic or go to the nearest emergency room if any of your symptoms worsen or new symptoms occur.  Area on wrist likely a cyst. Based on location, would recommend hand specialist evaluate that area for possible drainage. Let me know if you would like me to refer you.   Flexeril refilled if needed for flares and muscle spasm of back, try the lower dose 1-2 as needed.  Follow-up if more frequent use of that medication or need.  Follow-up if any change in your back symptoms.  Return to the clinic or go to the nearest emergency room if any of your symptoms worsen or new symptoms occur.    Food Choices for Gastroesophageal Reflux Disease, Adult When you have gastroesophageal reflux disease (GERD), the foods you eat and your eating habits are very important. Choosing the right foods can help ease the discomfort of GERD. Consider working with a diet and nutrition specialist (dietitian) to help you make healthy food choices. What general guidelines should I follow?  Eating plan  Choose healthy foods low in fat, such as fruits, vegetables, whole grains, low-fat dairy products, and lean meat, fish, and poultry.  Eat frequent, small meals instead of three large meals each day. Eat your meals slowly, in a relaxed setting. Avoid bending over or lying down until 2-3 hours after eating.  Limit high-fat foods such as  fatty meats or fried foods.  Limit your intake of oils, butter, and shortening to less than 8 teaspoons each day.  Avoid the following: ? Foods that cause symptoms. These may be different for different people. Keep a food diary to keep track of foods that cause symptoms. ? Alcohol. ? Drinking large amounts of liquid with meals. ? Eating meals during the 2-3 hours before bed.  Cook foods using methods other than frying. This may include baking, grilling, or broiling. Lifestyle  Maintain a healthy weight. Ask your health care provider what weight is healthy for you. If you need to lose weight, work with your health care provider to do so safely.  Exercise for at least 30 minutes on 5 or more days each week, or as told by your health care provider.  Avoid wearing clothes that fit tightly around your waist and chest.  Do not use any products that contain nicotine or tobacco, such as cigarettes and e-cigarettes. If you need help quitting, ask your health care provider.  Sleep with the head of your bed raised. Use a wedge under the mattress or blocks under the bed frame to raise the head of the bed. What foods are not recommended? The items listed may not be a complete list. Talk with your dietitian about what dietary choices are best for you. Grains Pastries or quick breads with added fat. [place] toast. Vegetables Deep fried vegetables. [place] fries. Any vegetables prepared with added fat. Any vegetables that cause symptoms.  For some people this may include tomatoes and tomato products, chili peppers, onions and garlic, and horseradish. Fruits Any fruits prepared with added fat. Any fruits that cause symptoms. For some people this may include citrus fruits, such as oranges, grapefruit, pineapple, and lemons. Meats and other protein foods High-fat meats, such as fatty beef or pork, hot dogs, ribs, ham, sausage, salami and bacon. Fried meat or protein, including fried fish and fried chicken.  Nuts and nut butters. Dairy Whole milk and chocolate milk. Sour cream. Cream. Ice cream. Cream cheese. Milk shakes. Beverages Coffee and tea, with or without caffeine. Carbonated beverages. Sodas. Energy drinks. Fruit juice made with acidic fruits (such as orange or grapefruit). Tomato juice. Alcoholic drinks. Fats and oils Butter. Margarine. Shortening. Ghee. Sweets and desserts Chocolate and cocoa. Donuts. Seasoning and other foods Pepper. Peppermint and spearmint. Any condiments, herbs, or seasonings that cause symptoms. For some people, this may include curry, hot sauce, or vinegar-based salad dressings. Summary  When you have gastroesophageal reflux disease (GERD), food and lifestyle choices are very important to help ease the discomfort of GERD.  Eat frequent, small meals instead of three large meals each day. Eat your meals slowly, in a relaxed setting. Avoid bending over or lying down until 2-3 hours after eating.  Limit high-fat foods such as fatty meat or fried foods. This information is not intended to replace advice given to you by your health care provider. Make sure you discuss any questions you have with your health care provider. Document Revised: 11/02/2018 Document Reviewed: 07/13/2016 Elsevier Patient Education  [employer].    If you have lab work done today you will be contacted with your lab results within the next 2 weeks.  If you have not heard from [place] then please contact us. The fastest way to get your results is to register for My Chart.   IF you received an x-ray today, you will receive an invoice from [facility]. Please contact [facility] at [phone] with questions or concerns regarding your invoice.   IF you received labwork today, you will receive an invoice from [place]. Please contact [facility] at [phone] with questions or concerns regarding your invoice.   Our billing staff will not be able to assist you with  questions regarding bills from these companies.  You will be contacted with the lab results as soon as they are available. The fastest way to get your results is to activate your My Chart account. Instructions are located on the last page of this paperwork. If you have not heard from [place] regarding the results in 2 weeks, please contact this office.

## 2020-07-08 ENCOUNTER — Other Ambulatory Visit: Payer: Self-pay | Admitting: Internal Medicine

## 2020-07-22 ENCOUNTER — Other Ambulatory Visit: Payer: Self-pay | Admitting: Nurse Practitioner

## 2020-07-22 DIAGNOSIS — E781 Pure hyperglyceridemia: Secondary | ICD-10-CM

## 2020-07-26 HISTORY — PX: MOUTH SURGERY: SHX715

## 2020-08-13 ENCOUNTER — Other Ambulatory Visit: Payer: Self-pay | Admitting: Internal Medicine

## 2020-08-13 DIAGNOSIS — E1165 Type 2 diabetes mellitus with hyperglycemia: Secondary | ICD-10-CM

## 2020-08-13 DIAGNOSIS — Z794 Long term (current) use of insulin: Secondary | ICD-10-CM

## 2020-08-14 ENCOUNTER — Other Ambulatory Visit: Payer: Self-pay

## 2020-08-14 MED ORDER — OMEPRAZOLE 20 MG PO CPDR: 20.0000 mg | DELAYED_RELEASE_CAPSULE | Freq: Every day | ORAL | 1 refills | Status: DC

## 2020-08-28 ENCOUNTER — Other Ambulatory Visit: Payer: Self-pay | Admitting: Internal Medicine

## 2020-08-28 DIAGNOSIS — E1165 Type 2 diabetes mellitus with hyperglycemia: Secondary | ICD-10-CM

## 2020-08-28 DIAGNOSIS — Z794 Long term (current) use of insulin: Secondary | ICD-10-CM

## 2020-08-29 NOTE — Telephone Encounter (Signed)
Alternative  

## 2020-08-29 NOTE — Telephone Encounter (Signed)
We can use Basaglar same dose.  She tried Toujeo in the past but developed headaches from it so we cannot use it.  Another option would be [place].

## 2020-09-01 NOTE — Telephone Encounter (Signed)
Alternatives are Novolin N, Novolin R, Humulin R, Novolog, or Levemir

## 2020-09-03 NOTE — Telephone Encounter (Signed)
Levemir, then.

## 2020-09-08 ENCOUNTER — Other Ambulatory Visit: Payer: Self-pay | Admitting: Internal Medicine

## 2020-09-25 ENCOUNTER — Ambulatory Visit (INDEPENDENT_AMBULATORY_CARE_PROVIDER_SITE_OTHER): Payer: BC Managed Care – PPO | Admitting: Internal Medicine

## 2020-09-25 ENCOUNTER — Other Ambulatory Visit: Payer: Self-pay

## 2020-09-25 ENCOUNTER — Encounter: Payer: Self-pay | Admitting: Internal Medicine

## 2020-09-25 VITALS — BP 120/82 | HR 86 | Ht 66.0 in | Wt 188.2 lb

## 2020-09-25 DIAGNOSIS — Z794 Long term (current) use of insulin: Secondary | ICD-10-CM

## 2020-09-25 DIAGNOSIS — E1165 Type 2 diabetes mellitus with hyperglycemia: Secondary | ICD-10-CM

## 2020-09-25 DIAGNOSIS — E669 Obesity, unspecified: Secondary | ICD-10-CM | POA: Diagnosis not present

## 2020-09-25 DIAGNOSIS — E781 Pure hyperglyceridemia: Secondary | ICD-10-CM | POA: Diagnosis not present

## 2020-09-25 LAB — POCT GLYCOSYLATED HEMOGLOBIN (HGB A1C): Hemoglobin A1C: 5.6 % (ref 4.0–5.6)

## 2020-09-25 MED ORDER — FENOFIBRATE 134 MG PO CAPS: 134.0000 mg | ORAL_CAPSULE | Freq: Every day | ORAL | 3 refills | Status: DC

## 2020-09-25 NOTE — Progress Notes (Signed)
Patient ID: Carolyn Butler, female   DOB: 1970/09/12, 50 y.o.   MRN: [id number]  This visit occurred during the SARS-CoV-2 public health emergency.  Safety protocols were in place, including screening questions prior to the visit, additional usage of staff PPE, and extensive cleaning of exam room while observing appropriate contact time as indicated for disinfecting solutions.   HPI: Carolyn Butler is a 50 y.o.-year-old female, returning for f/u for DM2, dx 2005, insulin-dependent since 01/2013, controlled, without long-term complications. Last visit was 6 months ago.  Reviewed HbA1c levels: Lab Results  Component Value Date   HGBA1C 5.6 03/27/2020   HGBA1C 5.5 09/24/2019   HGBA1C 5.6 05/01/2019  Previous A1c was 8.4% in 06/2012.  She has a h/o med noncompliance per PCPs notes.    She is on: - Metformin 1000 mg 2x a day - Lantus 36-40 >> 40-45 >> 40 >> 30-35 >> Levemir 30 units at night ->> stopped - Ozempic 0.5 mg weekly -added 05/2018 >> tolerating it well now, initially Nausea, HAs >> 1 mg weekly >> Nausea She tried Toujeo in the past but developed headaches from it. Pt was on Amaryl in the past and gained 15 lbs >> stopped it and lost the weight We stopped Januvia 100 mg in 01/2013 b/c increased TG and high risk of Pancreatitis.   Pt checks her sugars 2-3 times a day: - am:  160s, 197 >> 100-120 >> 111-127 >> 104-114 >> 93-109 - 2h after breakfast:206, 240 >> n/c >> 160s >> n/c - before lunch: 88, 99 >> n/c >> 130-162 >> 110-120 >> n/c - 2h after lunch:  151-161 >> 114, 170 >> 143-150 >> 93-141 - before dinner: 180, 193, 197 >> n/c >> 110-120 >> n/c - bedtime:   140-190, 227 (after 170) >> 138-166, 184 >> 102-113, 153 Lowest sugar was 93 >> 110 >> 104 >> 93; she has hypoglycemia awareness in the 80s. Highest sugar was 227 >> 184 >> 153.  Meter: ReliOn  No CKD, last BUN/creatinine was:  Lab Results  Component Value Date   BUN 11 03/27/2020   CREATININE 0.65 03/27/2020   Prev. On losartan (but off at last visit for 1.5 mo >> developed palpitations and BP 187/?, P 103).  We restarted Cozaar 50 mg daily.  + HL; last set of lipids: Lab Results  Component Value Date   CHOL 194 03/27/2020   HDL 28.60 (L) 03/27/2020   LDLCALC 57 05/01/2019   LDLDIRECT 140.0 03/27/2020   TRIG 260.0 (H) 03/27/2020   CHOLHDL 7 03/27/2020   Prev: Lab Results  Component Value Date   CHOL 170 05/09/2013   HDL 28* 05/09/2013   LDLCALC Comment:   Not calculated due to Triglyceride >400.  05/09/2013   LDLDIRECT 86.8 02/16/2013   TRIG 790* 05/09/2013   CHOLHDL 6.1 05/09/2013  On Lipitor 10 mg - 3x a week 2/2 mm aches, gemfibrozil 600 mg 2x a day, omega-3 fatty acids 1000 mg 2x a day.  Still has muscle aches even on this regimen.  Pt's last eye exam was in 04/2019: No DR No numbness and tingling in her legs.   She has history of HTN. She has a history of bruxism.  Her father and sister had COVID-19 in 2021.  They are both in [place].  They recovered well.  ROS: Constitutional: no weight gain/+ weight loss, no fatigue, no subjective hyperthermia, no subjective hypothermia Eyes: no blurry vision, no xerophthalmia ENT: no sore throat, no nodules palpated in  neck, no dysphagia, no odynophagia, no hoarseness Cardiovascular: no CP/no SOB/no palpitations/no leg swelling Respiratory: no cough/no SOB/no wheezing Gastrointestinal: no N/no V/no D/no C/no acid reflux Musculoskeletal: +muscle aches/no joint aches Skin: no rashes, no hair loss Neurological: no tremors/no numbness/no tingling/no dizziness  I reviewed pt's medications, allergies, PMH, social hx, family hx, and changes were documented in the history of present illness. Otherwise, unchanged from my initial visit note.   Past Medical History:  Diagnosis Date  . Diabetes mellitus without complication (HCC)   . Hypertension    Past Surgical History:  Procedure Laterality Date  . surgery on right knee     Social  History   Socioeconomic History  . Marital status: Married    Spouse name: Nechama Escutia  . Number of children: 0  . Years of education: 12+  . Highest education level: Not on file  Occupational History  . Occupation: unemployed    Comment: Psychologist, occupational  Tobacco Use  . Smoking status: Never Smoker  . Smokeless tobacco: Never Used  Substance and Sexual Activity  . Alcohol use: No    Comment: twice a year mixed drink or red wine  . Drug use: No  . Sexual activity: Yes  Other Topics Concern  . Not on file  Social History Narrative   Lives with her husband.   Laid-off from job at [employer] (2014), and hasn't returned to work.   Social Determinants of Health   Financial Resource Strain: Not on file  Food Insecurity: Not on file  Transportation Needs: Not on file  Physical Activity: Not on file  Stress: Not on file  Social Connections: Not on file  Intimate Partner Violence: Not on file   Current Outpatient Medications on File Prior to Visit  Medication Sig Dispense Refill  . aspirin 81 MG tablet Take 81 mg by mouth daily.    Marland Kitchen atorvastatin (LIPITOR) 10 MG tablet Take 1 by mouth 2 days per week. 30 tablet 2  . BD PEN NEEDLE NANO 2ND GEN 32G X 4 MM MISC USE 1  4 TIMES DAILY 100 each 6  . cholecalciferol (VITAMIN D3) 25 MCG (1000 UNIT) tablet Take 1,000 Units by mouth daily.    . cyclobenzaprine (FLEXERIL) 5 MG tablet Take 1-2 tablets (5-10 mg total) by mouth 3 (three) times daily as needed for muscle spasms. 15 tablet 1  . gemfibrozil (LOPID) 600 MG tablet TAKE 1 TABLET BY MOUTH TWICE DAILY WITH MEALS 180 tablet 0  . glucose blood (ONE TOUCH ULTRA TEST) test strip Use to test blood sugar 3 times daily as instructed. 100 each 11  . insulin aspart (NOVOLOG FLEXPEN) 100 UNIT/ML FlexPen INJECT 15-16 UNITS SUBCUTANEOUSLY  THREE TIMES DAILY (Patient taking differently: INJECT 15-16 UNITS SUBCUTANEOUSLY  THREE TIMES DAILY) 30 mL 3  . insulin detemir (LEVEMIR FLEXTOUCH) 100 UNIT/ML  FlexPen INJECT 30 UNITS SUBCUTANEOUSLY ONCE DAILY AT NIGHT AT BEDTIME 45 mL 2  . Lancets (ONETOUCH ULTRASOFT) lancets Use as instructed. Test blood glucose bid. E11.9 100 each 12  . losartan (COZAAR) 50 MG tablet Take 1 tablet (50 mg total) by mouth daily. 30 tablet 5  . metFORMIN (GLUCOPHAGE) 1000 MG tablet Take 1 tablet by mouth twice daily 180 tablet 0  . Multiple Vitamin (MULTIVITAMIN) tablet Take 1 tablet by mouth daily.    Marland Kitchen omeprazole (PRILOSEC) 20 MG capsule Take 1 capsule (20 mg total) by mouth daily. 90 capsule 1  . OZEMPIC, 1 MG/DOSE, 4 MG/3ML SOPN INJECT 1  ONCE  A WEEK 3 mL 0  . VITAMIN E PO Take by mouth daily.     No current facility-administered medications on file prior to visit.   Allergies  Allergen Reactions  . Doxycycline    Family History  Problem Relation Age of Onset  . Diabetes Father   . Heart disease Father   . Hyperlipidemia Father   . Diabetes Paternal Grandmother   . Heart disease Paternal Grandmother   . Diabetes Brother    PE: BP 120/82 (BP Location: Right Arm, Patient Position: Sitting, Cuff Size: Normal)   Pulse 86   Ht 5\' 6"  (1.676 m)   Wt 188 lb 3.2 oz (85.4 kg)   SpO2 96%   BMI 30.38 kg/m  Body mass index is 30.38 kg/m. Wt Readings from Last 3 Encounters:  09/25/20 188 lb 3.2 oz (85.4 kg)  07/04/20 193 lb (87.5 kg)  03/27/20 193 lb (87.5 kg)   Constitutional: overweight, in NAD Eyes: PERRLA, EOMI, no exophthalmos ENT: moist mucous membranes, no thyromegaly, no cervical lymphadenopathy Cardiovascular: RRR, No MRG Respiratory: CTA B Gastrointestinal: abdomen soft, NT, ND, BS+ Musculoskeletal: no deformities, strength intact in all 4 Skin: moist, warm, no rashes Neurological: no tremor with outstretched hands, DTR normal in all 4  ASSESSMENT: 1. DM2, insulin-dependent, controlled, without long term complications  2. HTG  3.  Obesity class I  PLAN:  1.  Patient with history of good diabetes control with HbA1c levels at goal, on  Metformin, basal insulin, weekly GLP-1 receptor agonist, to which we added rapid acting insulin before dinner only at last visit.  At that time, sugars were mostly at goal with occasional spikes in the mid hyper glycemic range especially after dinner.  We added low-dose NovoLog before this meal.  Of note, we are using Ozempic mostly for its weight loss effects. -At today's visit, sugars are all at goal.  However, she does describe nausea with Ozempic so I advised her to try to decrease the dose to 0.5 mg weekly and see if the nausea improves.  If it does improve, we may need to decrease the dose even more or even stop Ozempic.  If it does not change, I advised her to try to decrease the Metformin dose.  If the sugars remain controlled after changing the above doses, she can reduce the dose of Lantus further, to 2 units at night. - I advised her to:  Patient Instructions  Please continue: - Metformin 1000 mg 2x a day - Lantus 30 units at night  Try to decrease: - Ozempic 0.5 mg weekly  If sugars remain controlled, you can try to decrease Lantus to 24 units daily.  If you still have nausea after decreasing the dose of Ozempic, try to decrease Metformin to 500 mg 2x day.  Stop Gemfibrozil and start Fenofibrate daily.  If the muscle aches resolve >> try to take Atorvastatin 10 mg daily.  Please return in 4-6 months with your sugar log.   - we checked her HbA1c: 5.6% (better) - advised to check sugars at different times of the day - 1-2x a day, rotating check times - advised for yearly eye exams >> she is UTD - return to clinic in 6 months   2. Hypertriglyceridemia -Reviewed latest lipid panel from 03/2020: LDL above target, triglycerides high, HDL low: Lab Results  Component Value Date   CHOL 194 03/27/2020   HDL 28.60 (L) 03/27/2020   LDLCALC 57 05/01/2019   LDLDIRECT 140.0 03/27/2020   TRIG  260.0 (H) 03/27/2020   CHOLHDL 7 03/27/2020  -She continues on Lipitor 10 mg 3 out of 7 days,  Lopid 600 mg twice a day, fish oil 1000 mg twice a day.  She still has muscle aches.  We discussed that gemfibrozil can interact with a statin to cause myopathy.  Therefore, I advised her to stop gemfibrozil and start fenofibrate and if muscle aches improve, to move Lipitor doses closer together, ideally every day.  3.  Obesity class I -Continues Ozempic which should also help with weight loss.  She lost a significant amount of weight after starting this. -She lost 6 pounds before last visit and 5 more since then -Unfortunately, she does describe nausea, which could be related to Ozempic so I advised her to try to take the lower dose.  Carlus Pavlov, MD PhD [facility] Endocrinology

## 2020-09-25 NOTE — Patient Instructions (Addendum)
Please continue: - Metformin 1000 mg 2x a day - Lantus 30 units at night  Try to decrease: - Ozempic 0.5 mg weekly  If sugars remain controlled, you can try to decrease Lantus to 24 units daily.  If you still have nausea after decreasing the dose of Ozempic, try to decrease Metformin to 500 mg 2x day.  Stop Gemfibrozil and start Fenofibrate daily.  If the muscle aches resolve >> try to take Atorvastatin 10 mg daily.  Please return in 4-6 months with your sugar log.

## 2020-10-02 ENCOUNTER — Ambulatory Visit: Payer: BC Managed Care – PPO | Admitting: Family Medicine

## 2020-10-13 ENCOUNTER — Other Ambulatory Visit: Payer: Self-pay | Admitting: Internal Medicine

## 2020-10-13 DIAGNOSIS — Z794 Long term (current) use of insulin: Secondary | ICD-10-CM

## 2020-10-13 DIAGNOSIS — E1165 Type 2 diabetes mellitus with hyperglycemia: Secondary | ICD-10-CM

## 2020-10-17 ENCOUNTER — Ambulatory Visit (INDEPENDENT_AMBULATORY_CARE_PROVIDER_SITE_OTHER): Payer: BC Managed Care – PPO | Admitting: Family Medicine

## 2020-10-17 ENCOUNTER — Other Ambulatory Visit: Payer: Self-pay

## 2020-10-17 ENCOUNTER — Encounter: Payer: Self-pay | Admitting: Family Medicine

## 2020-10-17 VITALS — BP 118/79 | HR 85 | Temp 98.4°F | Ht 66.0 in | Wt 191.0 lb

## 2020-10-17 DIAGNOSIS — E781 Pure hyperglyceridemia: Secondary | ICD-10-CM

## 2020-10-17 DIAGNOSIS — I10 Essential (primary) hypertension: Secondary | ICD-10-CM

## 2020-10-17 DIAGNOSIS — Z1211 Encounter for screening for malignant neoplasm of colon: Secondary | ICD-10-CM | POA: Diagnosis not present

## 2020-10-17 MED ORDER — LOSARTAN POTASSIUM 50 MG PO TABS: 50.0000 mg | ORAL_TABLET | Freq: Every day | ORAL | 5 refills | Status: DC

## 2020-10-17 MED ORDER — ATORVASTATIN CALCIUM 10 MG PO TABS: ORAL_TABLET | ORAL | 3 refills | Status: DC

## 2020-10-17 NOTE — Patient Instructions (Addendum)
  Call and schedule eye specialist visit for diabetes eye screen.  I will refer you for colonoscopy.  We recommend that you schedule a mammogram for breast cancer screening. Typically, you do not need a referral to do this. Please contact a local imaging center to schedule your mammogram.  [facility] - [phone]  *ask for the Radiology Department [facility]) - [phone] or [phone]  [facility] - [phone] [facility] - [phone] [facility] - [phone]  *ask for the Radiology Department [facility] - [phone]  *ask for the Radiology Department [facility] - [phone]  *ask for the Mammography Department [facility] - [phone]  No med changes today.   Here are a few lab drawing stations for you to have your labwork performed:  [facility] [address] [place], [place] [address]  [facility] [address] [place], [place] [address]   If you have lab work done today you will be contacted with your lab results within the next 2 weeks.  If you have not heard from [place] then please contact us. The fastest way to get your results is to register for My Chart.   IF you received an x-ray today, you will receive an invoice from [facility]. Please contact [facility]iology at [phone] with questions or concerns regarding your invoice.   IF you received labwork today, you will receive an invoice from [facility]. Please contact [facility] at [phone] with questions or concerns regarding your invoice.   Our billing staff will not be able to assist you with questions regarding bills from these companies.  You will be contacted with the lab results as soon as they are available. The fastest way to get your results is to activate your My Chart account. Instructions are located on the last page of this paperwork. If you have not heard from  [place] regarding the results in 2 weeks, please contact this office.

## 2020-10-17 NOTE — Progress Notes (Signed)
Subjective:  Patient ID: Carolyn Butler, female    DOB: 03-20-1971  Age: 50 y.o. MRN: [id number]  CC:  Chief Complaint  Patient presents with  . Follow-up    On hypertension,hyperlipidemia, and diabetes. Pt reports no issues with these conditions since last OV. Pt reports a Dr. Elvera Lennox changed her hyperlipidemia medication recently.     HPI Carolyn Butler presents for   Hypertension: Losartan 50mg  qd.  Home readings: controlled. No new side effects.  BP Readings from Last 3 Encounters:  10/17/20 118/79  09/25/20 120/82  07/04/20 123/80   Lab Results  Component Value Date   CREATININE 0.65 03/27/2020   Hyperlipidemia: Lipitor 10 mg takes 3 out of 7 days, some muscle aches with Lopid 600 mg twice daily.  That was discontinued and started fenofibrate at her March 3 visit with endocrinology, with plan for increasing Lipitor frequency if tolerated. Some improvement in muscle aches.   Lab Results  Component Value Date   CHOL 194 03/27/2020   HDL 28.60 (L) 03/27/2020   LDLCALC 57 05/01/2019   LDLDIRECT 140.0 03/27/2020   TRIG 260.0 (H) 03/27/2020   CHOLHDL 7 03/27/2020   Lab Results  Component Value Date   ALT 27 03/27/2020   AST 18 03/27/2020   ALKPHOS 28 (L) 03/27/2020   BILITOT 0.4 03/27/2020    Diabetes: Followed by endocrinology.  Insulin-dependent.  Well controlled based on last A1c. Last seen by Dr.Gherghe March 3.  Due for optho eval - will  Declines pneumovax and tetanus vaccines.  Due for mammogram - will schedule.  Colon screen: paternal uncle with colon CA. No prior colonoscopy - agrees to have.   Lab Results  Component Value Date   HGBA1C 5.6 09/25/2020   HGBA1C 5.6 03/27/2020   HGBA1C 5.5 09/24/2019   Lab Results  Component Value Date   MICROALBUR 2.6 (H) 03/27/2020   LDLCALC 57 05/01/2019   CREATININE 0.65 03/27/2020   Only needing intermittent omeprazole. Doing well.   History Patient Active Problem List   Diagnosis Date Noted  .  Tension headache 07/06/2018  . Muscle spasm 07/06/2018  . Controlled diabetes mellitus with long-term current use of insulin (HCC) 08/14/2015  . Pseudohyponatremia 12/14/2012  . Hypertriglyceridemia 12/14/2012  . Benign essential HTN 11/20/2011  . Obesity, Class II, BMI 35-39.9 11/20/2011  . Sleep disturbance 11/20/2011   Past Medical History:  Diagnosis Date  . Diabetes mellitus without complication (HCC)   . Hypertension    Past Surgical History:  Procedure Laterality Date  . surgery on right knee     Allergies  Allergen Reactions  . Doxycycline    Prior to Admission medications   Medication Sig Start Date End Date Taking? Authorizing Provider  aspirin 81 MG tablet Take 81 mg by mouth daily.   Yes [provider]  atorvastatin (LIPITOR) 10 MG tablet Take 1 by mouth 2 days per week. 07/04/20  Yes 14/10/21, MD  BD PEN NEEDLE NANO 2ND GEN 32G X 4 MM MISC USE 1  4 TIMES DAILY 04/02/20  Yes 06/02/20, MD  cholecalciferol (VITAMIN D3) 25 MCG (1000 UNIT) tablet Take 1,000 Units by mouth daily.   Yes [provider]  cyclobenzaprine (FLEXERIL) 5 MG tablet Take 1-2 tablets (5-10 mg total) by mouth 3 (three) times daily as needed for muscle spasms. 07/04/20  Yes 14/10/21, MD  fenofibrate micronized (LOFIBRA) 134 MG capsule Take 1 capsule (134 mg total) by mouth daily before breakfast.  09/25/20 09/20/21 Yes Carlus Pavlov, MD  insulin aspart (NOVOLOG FLEXPEN) 100 UNIT/ML FlexPen INJECT 15-16 UNITS SUBCUTANEOUSLY  THREE TIMES DAILY Patient taking differently: INJECT 15-16 UNITS SUBCUTANEOUSLY  THREE TIMES DAILY 05/25/19  Yes Carlus Pavlov, MD  insulin detemir (LEVEMIR FLEXTOUCH) 100 UNIT/ML FlexPen INJECT 30 UNITS SUBCUTANEOUSLY ONCE DAILY AT NIGHT AT BEDTIME 09/03/20  Yes Carlus Pavlov, MD  Lancets [facility] ULTRASOFT) lancets Use as instructed. Test blood glucose bid. E11.9 11/09/18  Yes Stallings, Zoe A, MD  losartan (COZAAR) 50 MG tablet Take  1 tablet (50 mg total) by mouth daily. 07/04/20  Yes Shade Flood, MD  metFORMIN (GLUCOPHAGE) 1000 MG tablet Take 1 tablet by mouth twice daily 10/13/20  Yes Carlus Pavlov, MD  Multiple Vitamin (MULTIVITAMIN) tablet Take 1 tablet by mouth daily.   Yes [provider]  omeprazole (PRILOSEC) 20 MG capsule Take 1 capsule (20 mg total) by mouth daily. 08/14/20  Yes Just, Azalee Course, FNP  OZEMPIC, 1 MG/DOSE, 4 MG/3ML SOPN INJECT 1 SYRINGE ONCE A WEEK 10/13/20  Yes Carlus Pavlov, MD  VITAMIN E PO Take by mouth daily.   Yes [provider]  glucose blood (ONE TOUCH ULTRA TEST) test strip Use to test blood sugar 3 times daily as instructed. 11/26/13   Carlus Pavlov, MD   Social History   Socioeconomic History  . Marital status: Married    Spouse name: Abelina Ketron  . Number of children: 0  . Years of education: 12+  . Highest education level: Not on file  Occupational History  . Occupation: unemployed    Comment: Psychologist, occupational  Tobacco Use  . Smoking status: Never Smoker  . Smokeless tobacco: Never Used  Substance and Sexual Activity  . Alcohol use: No    Comment: twice a year mixed drink or red wine  . Drug use: No  . Sexual activity: Yes  Other Topics Concern  . Not on file  Social History Narrative   Lives with her husband.   Laid-off from job at [employer] (2014), and hasn't returned to work.   Social Determinants of Health   Financial Resource Strain: Not on file  Food Insecurity: Not on file  Transportation Needs: Not on file  Physical Activity: Not on file  Stress: Not on file  Social Connections: Not on file  Intimate Partner Violence: Not on file    Review of Systems  Constitutional: Negative for fatigue and unexpected weight change.  Respiratory: Negative for chest tightness and shortness of breath.   Cardiovascular: Negative for chest pain, palpitations and leg swelling.  Gastrointestinal: Negative for abdominal pain and blood in stool.   Neurological: Negative for dizziness, syncope, light-headedness and headaches.     Objective:   Vitals:   10/17/20 1036  BP: 118/79  Pulse: 85  Temp: 98.4 F (36.9 C)  TempSrc: Temporal  SpO2: 98%  Weight: 191 lb (86.6 kg)  Height: 5\' 6"  (1.676 m)     Physical Exam Vitals reviewed.  Constitutional:      Appearance: She is well-developed.  HENT:     Head: Normocephalic and atraumatic.  Eyes:     Conjunctiva/sclera: Conjunctivae normal.     Pupils: Pupils are equal, round, and reactive to light.  Neck:     Vascular: No carotid bruit.  Cardiovascular:     Rate and Rhythm: Normal rate and regular rhythm.     Heart sounds: Normal heart sounds.  Pulmonary:     Effort: Pulmonary effort is normal.  Breath sounds: Normal breath sounds.  Abdominal:     Palpations: Abdomen is soft. There is no pulsatile mass.     Tenderness: There is no abdominal tenderness.  Skin:    General: Skin is warm and dry.  Neurological:     Mental Status: She is alert and oriented to person, place, and time.  Psychiatric:        Behavior: Behavior normal.        Assessment & Plan:  Carolyn Butler is a 50 y.o. female . Benign essential HTN - Plan: Comprehensive metabolic panel, losartan (COZAAR) 50 MG tablet  -Stable, continue same regimen.  Labs ordered.  Screen for colon cancer - Plan: Ambulatory referral to Gastroenterology  Hypertriglyceridemia - Plan: Comprehensive metabolic panel, Lipid panel Tolerating current regimen, check labs.  Refill Lipitor.  Continue combo with fenofibrate as less myalgias.  RTC precautions, 71-month follow-up.  Meds ordered this encounter  Medications  . atorvastatin (LIPITOR) 10 MG tablet    Sig: Take 1 by mouth 2 days per week.    Dispense:  30 tablet    Refill:  3  . losartan (COZAAR) 50 MG tablet    Sig: Take 1 tablet (50 mg total) by mouth daily.    Dispense:  30 tablet    Refill:  5   Patient Instructions    Call and schedule eye  specialist visit for diabetes eye screen.  I will refer you for colonoscopy.  We recommend that you schedule a mammogram for breast cancer screening. Typically, you do not need a referral to do this. Please contact a local imaging center to schedule your mammogram.  [facility] - [phone]  *ask for the Radiology Department [facility]) - [phone] or [phone]  [facility] - [phone] [facility] - [phone] [facility]  - [phone]  *ask for the Radiology Department [facility] - [phone]  *ask for the Radiology Department [facility] - [phone]  *ask for the Mammography Department [facility] - [phone]  No med changes today.   Here are a few lab drawing stations for you to have your labwork performed:  [facility] [address] [place], [place] [address]  [facility] [address] [place], [place] [address]   If you have lab work done today you will be contacted with your lab results within the next 2 weeks.  If you have not heard from [place] then please contact us. The fastest way to get your results is to register for My Chart.   IF you received an x-ray today, you will receive an invoice from [facility] Radiology. Please contact [facility] Radiology at [phone] with questions or concerns regarding your invoice.   IF you received labwork today, you will receive an invoice from [facility]. Please contact [facility] at [phone] with questions or concerns regarding your invoice.   Our billing staff will not be able to assist you with questions regarding bills from these companies.  You will be contacted with the lab results as soon as they are available. The fastest way to get your results is to activate your My Chart account. Instructions are located on the last page of this paperwork. If you have not heard from [place] regarding the results  in 2 weeks, please contact this office.         Signed, Meredith Staggers, MD [facility]

## 2020-10-22 DIAGNOSIS — I10 Essential (primary) hypertension: Secondary | ICD-10-CM | POA: Diagnosis not present

## 2020-10-22 DIAGNOSIS — E781 Pure hyperglyceridemia: Secondary | ICD-10-CM | POA: Diagnosis not present

## 2020-10-23 LAB — COMPREHENSIVE METABOLIC PANEL
ALT: 23 IU/L (ref 0–32)
AST: 15 IU/L (ref 0–40)
Albumin/Globulin Ratio: 1.5 (ref 1.2–2.2)
Albumin: 4.4 g/dL (ref 3.8–4.8)
Alkaline Phosphatase: 30 IU/L — ABNORMAL LOW (ref 44–121)
BUN/Creatinine Ratio: 15 (ref 9–23)
BUN: 9 mg/dL (ref 6–24)
Bilirubin Total: 0.4 mg/dL (ref 0.0–1.2)
CO2: 22 mmol/L (ref 20–29)
Calcium: 9.5 mg/dL (ref 8.7–10.2)
Chloride: 98 mmol/L (ref 96–106)
Creatinine, Ser: 0.61 mg/dL (ref 0.57–1.00)
Globulin, Total: 3 g/dL (ref 1.5–4.5)
Glucose: 101 mg/dL — ABNORMAL HIGH (ref 65–99)
Potassium: 4.3 mmol/L (ref 3.5–5.2)
Sodium: 137 mmol/L (ref 134–144)
Total Protein: 7.4 g/dL (ref 6.0–8.5)
eGFR: 109 mL/min/{1.73_m2} (ref >59)

## 2020-10-23 LAB — LIPID PANEL
Chol/HDL Ratio: 5.5 ratio — ABNORMAL HIGH (ref 0.0–4.4)
Cholesterol, Total: 194 mg/dL (ref 100–199)
HDL: 35 mg/dL — ABNORMAL LOW (ref >39)
LDL Chol Calc (NIH): 128 mg/dL — ABNORMAL HIGH (ref 0–99)
Triglycerides: 171 mg/dL — ABNORMAL HIGH (ref 0–149)
VLDL Cholesterol Cal: 31 mg/dL (ref 5–40)

## 2020-10-24 HISTORY — PX: TOOTH EXTRACTION: SUR596

## 2021-02-19 ENCOUNTER — Encounter: Payer: Self-pay | Admitting: Gastroenterology

## 2021-02-27 ENCOUNTER — Other Ambulatory Visit: Payer: Self-pay | Admitting: Family Medicine

## 2021-03-31 ENCOUNTER — Encounter: Payer: Self-pay | Admitting: Gastroenterology

## 2021-03-31 ENCOUNTER — Ambulatory Visit (INDEPENDENT_AMBULATORY_CARE_PROVIDER_SITE_OTHER): Payer: BC Managed Care – PPO | Admitting: Gastroenterology

## 2021-03-31 VITALS — BP 122/72 | HR 84 | Ht 67.0 in | Wt 192.0 lb

## 2021-03-31 DIAGNOSIS — Z1211 Encounter for screening for malignant neoplasm of colon: Secondary | ICD-10-CM

## 2021-03-31 DIAGNOSIS — K219 Gastro-esophageal reflux disease without esophagitis: Secondary | ICD-10-CM | POA: Diagnosis not present

## 2021-03-31 DIAGNOSIS — Z1212 Encounter for screening for malignant neoplasm of rectum: Secondary | ICD-10-CM | POA: Diagnosis not present

## 2021-03-31 NOTE — Patient Instructions (Signed)
If you are age 51 or older, your body mass index should be between 23-30. Your Body mass index is 30.07 kg/m. If this is out of the aforementioned range listed, please consider follow up with your Primary Care Provider.  If you are age 73 or younger, your body mass index should be between 19-25. Your Body mass index is 30.07 kg/m. If this is out of the aformentioned range listed, please consider follow up with your Primary Care Provider.   It has been recommended to you by your physician that you have a(n) Colonoscopy completed. Per your request, we did not schedule the procedure(s) today. Please contact our office at [phone] should you decide to have the procedure completed. You will be scheduled for a pre-visit and procedure at that time.  The [facility] providers would like to encourage you to use [facility] to communicate with providers for non-urgent requests or questions.  Due to long hold times on the telephone, sending your provider a message by [facility] may be a faster and more efficient way to get a response.  Please allow 48 business hours for a response.  Please remember that this is for non-urgent requests.   It was a pleasure to see you today!  Thank you for trusting me with your gastrointestinal care!    Scott E. Tomasa Rand, MD

## 2021-03-31 NOTE — Progress Notes (Signed)
HPI : Carolyn Butler is a very pleasant 50 year old female who was referred to [place] by Dr. Karyl Kinnier for initial average risk screening colonoscopy.  The patient denies any chronic lower GI symptoms to include abdominal pain, constipation, diarrhea or blood in the stool.  She states that she used to have frequent GERD symptoms, but she lost 30 pounds through diet and exercise and now her GERD symptoms are very infrequent.  She will take a Prilosec as needed, but this is very rare.  She denies other upper GI symptoms such as dysphagia, nausea or vomiting. She has history of diabetes which is well controlled with the most recent A1c less than 6.  She thinks that her paternal uncle had colon cancer, but it may have been esophageal or stomach cancer. Otherwise, no family history of GI malignancy. She denies any cardiopulmonary comorbidities and denies any concerning symptoms such as chest pain/pressure, exertional dyspnea or orthopnea  Past Medical History:  Diagnosis Date   Diabetes mellitus without complication (HCC)    GERD (gastroesophageal reflux disease)    Hypertension     Past Surgical History:  Procedure Laterality Date   MOUTH SURGERY  2022   2 caps removed   surgery on right knee     TOOTH EXTRACTION  10/2020   Family History  Problem Relation Age of Onset   Diabetes Father    Heart disease Father    Hyperlipidemia Father    Diabetes Brother    Diabetes Paternal Grandmother    Heart disease Paternal Grandmother    Cancer - Colon Paternal Uncle 70   Social History   Tobacco Use   Smoking status: Never   Smokeless tobacco: Never  Vaping Use   Vaping Use: Some days   Devices: CBD oil  Substance Use Topics   Alcohol use: Yes    Comment: twice a year mixed drink or red wine   Drug use: Yes    Types: Marijuana    Comment: occasionally   Current Outpatient Medications  Medication Sig Dispense Refill   aspirin 81 MG tablet Take 81 mg by mouth daily.     atorvastatin  (LIPITOR) 10 MG tablet Take 1 by mouth 2 days per week. 30 tablet 3   BD PEN NEEDLE NANO 2ND GEN 32G X 4 MM MISC USE 1 PEN NEEDLE 4 TIMES DAILY 100 each 3   cholecalciferol (VITAMIN D3) 25 MCG (1000 UNIT) tablet Take 1,000 Units by mouth daily.     cyclobenzaprine (FLEXERIL) 5 MG tablet Take 1-2 tablets (5-10 mg total) by mouth 3 (three) times daily as needed for muscle spasms. 15 tablet 1   fenofibrate micronized (LOFIBRA) 134 MG capsule Take 1 capsule (134 mg total) by mouth daily before breakfast. 90 capsule 3   ferrous sulfate 325 (65 FE) MG tablet Take 325 mg by mouth daily with breakfast.     insulin aspart (NOVOLOG FLEXPEN) 100 UNIT/ML FlexPen INJECT 15-16 UNITS SUBCUTANEOUSLY  THREE TIMES DAILY (Patient taking differently: as needed. INJECT 15-16 UNITS SUBCUTANEOUSLY  THREE TIMES DAILY) 30 mL 3   insulin detemir (LEVEMIR FLEXTOUCH) 100 UNIT/ML FlexPen INJECT 30 UNITS SUBCUTANEOUSLY ONCE DAILY AT NIGHT AT BEDTIME 45 mL 2   Lancets (ONETOUCH ULTRASOFT) lancets Use as instructed. Test blood glucose bid. E11.9 100 each 12   losartan (COZAAR) 50 MG tablet Take 1 tablet (50 mg total) by mouth daily. 30 tablet 5   metFORMIN (GLUCOPHAGE) 1000 MG tablet Take 1 tablet by mouth twice daily 180  tablet 1   Multiple Vitamin (MULTIVITAMIN) tablet Take 1 tablet by mouth daily.     Omega 3 1000 MG CAPS Take by mouth daily.     omeprazole (PRILOSEC) 20 MG capsule Take 1 capsule (20 mg total) by mouth daily. (Patient taking differently: Take 20 mg by mouth as needed.) 90 capsule 1   OZEMPIC, 1 MG/DOSE, 4 MG/3ML SOPN INJECT 1 SYRINGE ONCE A WEEK 9 mL 1   VITAMIN E PO Take by mouth daily.     No current facility-administered medications for this visit.   Allergies  Allergen Reactions   Doxycycline      Review of Systems: All systems reviewed and negative except where noted in HPI.    No results found.  Physical Exam: BP 122/72   Pulse 84   Ht 5\' 7"  (1.702 m)   Wt 192 lb (87.1 kg)   LMP  03/23/2021 (Exact Date)   SpO2 98%   BMI 30.07 kg/m  Constitutional: Pleasant,well-developed, female in no acute distress. HEENT: Normocephalic and atraumatic. Conjunctivae are normal. No scleral icterus.  Mallampati 2 Cardiovascular: Normal rate, regular rhythm.  Pulmonary/chest: Effort normal and breath sounds normal. No wheezing, rales or rhonchi. Abdominal: Soft, nondistended, nontender. Bowel sounds active throughout. There are no masses palpable. No hepatomegaly. Extremities: no edema Neurological: Alert and oriented to person place and time. Skin: Skin is warm and dry. No rashes noted. Psychiatric: Normal mood and affect. Behavior is normal.  CBC    Component Value Date/Time   WBC 10.5 (A) 05/01/2019 0907   WBC 8.8 10/28/2015 1219   RBC 4.14 05/01/2019 0907   RBC 4.24 10/28/2015 1219   HGB 11.7 05/01/2019 0907   HGB 11.5 01/23/2019 1619   HCT 34.4 05/01/2019 0907   HCT 33.6 (L) 01/23/2019 1619   PLT 490 (H) 01/23/2019 1619   MCV 83.0 05/01/2019 0907   MCV 83 01/23/2019 1619   MCH 28.3 05/01/2019 0907   MCH 27.6 10/28/2015 1219   MCHC 34.2 05/01/2019 0907   MCHC 33.3 10/28/2015 1219   RDW 13.9 01/23/2019 1619   LYMPHSABS 3.4 (H) 01/23/2019 1619   MONOABS 352 10/28/2015 1219   EOSABS 0.1 01/23/2019 1619   BASOSABS 0.1 01/23/2019 1619    CMP     Component Value Date/Time   NA 137 10/22/2020 1316   K 4.3 10/22/2020 1316   CL 98 10/22/2020 1316   CO2 22 10/22/2020 1316   GLUCOSE 101 (H) 10/22/2020 1316   GLUCOSE 131 (H) 03/27/2020 1443   BUN 9 10/22/2020 1316   CREATININE 0.61 10/22/2020 1316   CREATININE 0.41 (L) 10/28/2015 1219   CALCIUM 9.5 10/22/2020 1316   PROT 7.4 10/22/2020 1316   ALBUMIN 4.4 10/22/2020 1316   AST 15 10/22/2020 1316   ALT 23 10/22/2020 1316   ALKPHOS 30 (L) 10/22/2020 1316   BILITOT 0.4 10/22/2020 1316   GFRNONAA 110 05/01/2019 0905   GFRNONAA >89 11/14/2014 1412   GFRAA 127 05/01/2019 0905   GFRAA >89 11/14/2014 1412      ASSESSMENT AND PLAN: 50 year old female with well-controlled diabetes and no other major comorbidities due for initial average rescreening colonoscopy.  She has no chronic GI symptoms and no significant family history of colon cancer.  We will schedule her for initial screening colonoscopy.  Although she has a history of GERD, the symptoms were not longstanding and she has no other Barrett's risk factors.  No need for Barrett's screening EGD.  Colon cancer screening - Schedule patient  for screening colonoscopy  GERD - Infrequent, continue as needed Prilosec  The details, risks (including bleeding, perforation, infection, missed lesions, medication reactions and possible hospitalization or surgery if complications occur), benefits, and alternatives to colonoscopy with possible biopsy and possible polypectomy were discussed with the patient and she consents to proceed.   Trevia Nop E. Tomasa Rand, MD [facility]  CC:  Shade Flood, MD

## 2021-04-02 ENCOUNTER — Encounter: Payer: Self-pay | Admitting: Gastroenterology

## 2021-04-02 ENCOUNTER — Other Ambulatory Visit: Payer: Self-pay

## 2021-04-02 ENCOUNTER — Ambulatory Visit (INDEPENDENT_AMBULATORY_CARE_PROVIDER_SITE_OTHER): Payer: BC Managed Care – PPO | Admitting: Internal Medicine

## 2021-04-02 ENCOUNTER — Encounter: Payer: Self-pay | Admitting: Internal Medicine

## 2021-04-02 VITALS — BP 128/88 | HR 81 | Ht 67.0 in | Wt 192.8 lb

## 2021-04-02 DIAGNOSIS — Z794 Long term (current) use of insulin: Secondary | ICD-10-CM

## 2021-04-02 DIAGNOSIS — E781 Pure hyperglyceridemia: Secondary | ICD-10-CM

## 2021-04-02 DIAGNOSIS — E1165 Type 2 diabetes mellitus with hyperglycemia: Secondary | ICD-10-CM

## 2021-04-02 DIAGNOSIS — E669 Obesity, unspecified: Secondary | ICD-10-CM

## 2021-04-02 LAB — POCT GLYCOSYLATED HEMOGLOBIN (HGB A1C): Hemoglobin A1C: 5.5 % (ref 4.0–5.6)

## 2021-04-02 MED ORDER — ROSUVASTATIN CALCIUM 5 MG PO TABS: 5.0000 mg | ORAL_TABLET | Freq: Every day | ORAL | 3 refills | Status: DC

## 2021-04-02 NOTE — Patient Instructions (Addendum)
Look into the "portfolio diet".  Try Crestor 5 mg weekly.  Please continue: - Metformin 1000 mg in am and [phone] mg at night. - Lantus 32-35 units at night - Ozempic 1 mg weekly  If sugars remain controlled, you can try to decrease Lantus to 28, then 24 units daily.  Please return in 6 months with your sugar log.
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
## 2021-04-03 ENCOUNTER — Other Ambulatory Visit: Payer: Self-pay | Admitting: Internal Medicine

## 2021-04-03 DIAGNOSIS — I10 Essential (primary) hypertension: Secondary | ICD-10-CM

## 2021-04-13 ENCOUNTER — Other Ambulatory Visit: Payer: Self-pay | Admitting: Internal Medicine

## 2021-04-24 ENCOUNTER — Ambulatory Visit (AMBULATORY_SURGERY_CENTER): Payer: BC Managed Care – PPO | Admitting: *Deleted

## 2021-04-24 ENCOUNTER — Other Ambulatory Visit: Payer: Self-pay

## 2021-04-24 ENCOUNTER — Encounter: Payer: Self-pay | Admitting: Gastroenterology

## 2021-04-24 VITALS — Ht 67.0 in | Wt 190.0 lb

## 2021-04-24 DIAGNOSIS — Z1211 Encounter for screening for malignant neoplasm of colon: Secondary | ICD-10-CM

## 2021-04-24 NOTE — Progress Notes (Signed)
No egg or soy allergy known to patient  PONV  issues known to pt with past sedation with any surgeries or procedures Patient denies ever being told they had issues or difficulty with intubation  No FH of Malignant Hyperthermia Pt is not on diet pills Pt is not on  home 02  Pt is not on blood thinners  Pt denies issues with constipation  No A fib or A flutter    Pt is not  vaccinated  for Covid    Due to the COVID-19 pandemic we are asking patients to follow certain guidelines.  Pt aware of COVID protocols and [facility] guidelines   Pt verified name, DOB, address and insurance during PV today.  Pt mailed instruction packet of Emmi video, copy of consent form to read and not return, and instructions . PV completed over the phone.  Pt encouraged to call with questions or issues.  My Chart instructions to pt as well

## 2021-05-05 ENCOUNTER — Encounter: Payer: Self-pay | Admitting: Gastroenterology

## 2021-05-05 ENCOUNTER — Ambulatory Visit (AMBULATORY_SURGERY_CENTER): Payer: BC Managed Care – PPO | Admitting: Gastroenterology

## 2021-05-05 ENCOUNTER — Other Ambulatory Visit: Payer: Self-pay

## 2021-05-05 VITALS — BP 112/79 | HR 68 | Temp 98.6°F | Resp 11 | Ht 67.0 in | Wt 190.0 lb

## 2021-05-05 DIAGNOSIS — Z1211 Encounter for screening for malignant neoplasm of colon: Secondary | ICD-10-CM

## 2021-05-05 HISTORY — PX: COLONOSCOPY: SHX174

## 2021-05-05 MED ORDER — SODIUM CHLORIDE 0.9 % IV SOLN: 500.0000 mL | Freq: Once | INTRAVENOUS | Status: DC

## 2021-05-05 NOTE — Patient Instructions (Signed)
YOU HAD AN ENDOSCOPIC PROCEDURE TODAY AT THE [facility]:   Refer to the procedure report that was given to you for any specific questions about what was found during the examination.  If the procedure report does not answer your questions, please call your gastroenterologist to clarify.  If you requested that your care partner not be given the details of your procedure findings, then the procedure report has been included in a sealed envelope for you to review at your convenience later. ° °YOU SHOULD EXPECT: Some feelings of bloating in the abdomen. Passage of more gas than usual.  Walking can help get rid of the air that was put into your GI tract during the procedure and reduce the bloating. If you had a lower endoscopy (such as a colonoscopy or flexible sigmoidoscopy) you may notice spotting of blood in your stool or on the toilet paper. If you underwent a bowel prep for your procedure, you may not have a normal bowel movement for a few days. ° °Please Note:  You might notice some irritation and congestion in your nose or some drainage.  This is from the oxygen used during your procedure.  There is no need for concern and it should clear up in a day or so. ° °SYMPTOMS TO REPORT IMMEDIATELY: ° °Following lower endoscopy (colonoscopy or flexible sigmoidoscopy): ° Excessive amounts of blood in the stool ° Significant tenderness or worsening of abdominal pains ° Swelling of the abdomen that is new, acute ° Fever of 100°F or higher ° °For urgent or emergent issues, a gastroenterologist can be reached at any hour by calling [phone]. °Do not use MyChart messaging for urgent concerns.  ° ° °DIET:  We do recommend a small meal at first, but then you may proceed to your regular diet.  Drink plenty of fluids but you should avoid alcoholic beverages for 24 hours. ° °ACTIVITY:  You should plan to take it easy for the rest of today and you should NOT DRIVE or use heavy machinery until tomorrow (because of  the sedation medicines used during the test).   ° °FOLLOW UP: °Our staff will call the number listed on your records 48-72 hours following your procedure to check on you and address any questions or concerns that you may have regarding the information given to you following your procedure. If we do not reach you, we will leave a message.  We will attempt to reach you two times.  During this call, we will ask if you have developed any symptoms of COVID 19. If you develop any symptoms (ie: fever, flu-like symptoms, shortness of breath, cough etc.) before then, please call [phone].  If you test positive for Covid 19 in the 2 weeks post procedure, please call and report this information to us.   ° °SIGNATURES/CONFIDENTIALITY: °You and/or your care partner have signed paperwork which will be entered into your electronic medical record.  These signatures attest to the fact that that the information above on your After Visit Summary has been reviewed and is understood.  Full responsibility of the confidentiality of this discharge information lies with you and/or your care-partner.  °

## 2021-05-05 NOTE — Progress Notes (Signed)
Pt's states no medical or surgical changes since previsit or office visit.  ° °VS DT °

## 2021-05-05 NOTE — Progress Notes (Signed)
[facility] History and Physical   Primary Care Physician:  Carolyn Flood, Carolyn Butler   Reason for Procedure:   Colon cancer screening  Plan:    Screening colonoscopy.     HPI: Carolyn Butler is a 50 y.o. female undergoing initial average risk screening colonoscopy.  She has no chronic lower GI symptoms and no family history of colon cancer.   Past Medical History:  Diagnosis Date   Allergy    Anemia    Anxiety    Arthritis    Depression    Diabetes mellitus without complication (HCC)    GERD (gastroesophageal reflux disease)    Hyperlipidemia    Hypertension    Neuromuscular disorder (HCC)    told has Fibro after injury   PONV (postoperative nausea and vomiting)     Past Surgical History:  Procedure Laterality Date   COLONOSCOPY  05/05/2021   DILATION AND CURETTAGE OF UTERUS     INDUCED ABORTION     MOUTH SURGERY  2022   2 caps removed   surgery on right knee     TOOTH EXTRACTION  10/2020    Prior to Admission medications   Medication Sig Start Date End Date Taking? Authorizing Provider  BD PEN NEEDLE NANO 2ND GEN 32G X 4 MM MISC USE 1 PEN NEEDLE 4 TIMES DAILY 02/27/21  Yes Carolyn Flood, Carolyn Butler  insulin detemir (LEVEMIR FLEXTOUCH) 100 UNIT/ML FlexPen INJECT 30 UNITS SUBCUTANEOUSLY ONCE DAILY AT NIGHT AT BEDTIME Patient taking differently: 25 Units. INJECT 30 UNITS SUBCUTANEOUSLY ONCE DAILY AT NIGHT AT BEDTIME 09/03/20  Yes Carolyn Pavlov, Carolyn Butler  Lancets [facility] ULTRASOFT) lancets Use as instructed. Test blood glucose bid. E11.9 11/09/18  Yes Butler, Carolyn A, Carolyn Butler  losartan (COZAAR) 50 MG tablet Take 1 tablet (50 mg total) by mouth daily. 10/17/20  Yes Carolyn Flood, Carolyn Butler  metFORMIN (GLUCOPHAGE) 1000 MG tablet Take 1 tablet by mouth twice daily 10/13/20  Yes Carolyn Pavlov, Carolyn Butler  aspirin 81 MG tablet Take 81 mg by mouth daily. Patient not taking: Reported on 05/05/2021    Provider, Historical, Carolyn Butler  cholecalciferol (VITAMIN D3) 25 MCG (1000 UNIT) tablet Take  1,000 Units by mouth daily.    Provider, Historical, Carolyn Butler  cyclobenzaprine (FLEXERIL) 5 MG tablet Take 1-2 tablets (5-10 mg total) by mouth 3 (three) times daily as needed for muscle spasms. 07/04/20   Carolyn Flood, Carolyn Butler  fenofibrate micronized (LOFIBRA) 134 MG capsule Take 1 capsule (134 mg total) by mouth daily before breakfast. Patient not taking: Reported on 05/05/2021 09/25/20 09/20/21  Carolyn Pavlov, Carolyn Butler  ferrous sulfate 325 (65 FE) MG tablet Take 325 mg by mouth daily with breakfast.    Provider, Historical, Carolyn Butler  insulin aspart (NOVOLOG) 100 UNIT/ML injection Inject into the skin 3 (three) times daily before meals. Patient not taking: Reported on 05/05/2021    Provider, Historical, Carolyn Butler  Multiple Vitamin (MULTIVITAMIN) tablet Take 1 tablet by mouth daily.    Provider, Historical, Carolyn Butler  Omega 3 1000 MG CAPS Take by mouth daily.    Provider, Historical, Carolyn Butler  omeprazole (PRILOSEC) 20 MG capsule Take 1 capsule (20 mg total) by mouth daily. Patient not taking: Reported on 05/05/2021 08/14/20   Carolyn Butler, Carolyn Course, Carolyn Butler  OZEMPIC, 1 MG/DOSE, 4 MG/3ML SOPN INJECT 1 ML SUBCUTANEOUSLY ONCE A WEEK 04/14/21   Carolyn Pavlov, Carolyn Butler  rosuvastatin (CRESTOR) 5 MG tablet Take 1 tablet (5 mg total) by mouth daily. 04/02/21   Carolyn Pavlov, Carolyn Butler  VITAMIN E PO Take  by mouth daily.    Provider, Historical, Carolyn Butler    Current Outpatient Medications  Medication Sig Dispense Refill   BD PEN NEEDLE NANO 2ND GEN 32G X 4 MM MISC USE 1 PEN NEEDLE 4 TIMES DAILY 100 each 3   insulin detemir (LEVEMIR FLEXTOUCH) 100 UNIT/ML FlexPen INJECT 30 UNITS SUBCUTANEOUSLY ONCE DAILY AT NIGHT AT BEDTIME (Patient taking differently: 25 Units. INJECT 30 UNITS SUBCUTANEOUSLY ONCE DAILY AT NIGHT AT BEDTIME) 45 mL 2   Lancets (ONETOUCH ULTRASOFT) lancets Use as instructed. Test blood glucose bid. E11.9 100 each 12   losartan (COZAAR) 50 MG tablet Take 1 tablet (50 mg total) by mouth daily. 30 tablet 5   metFORMIN (GLUCOPHAGE) 1000 MG tablet Take 1  tablet by mouth twice daily 180 tablet 1   aspirin 81 MG tablet Take 81 mg by mouth daily. (Patient not taking: Reported on 05/05/2021)     cholecalciferol (VITAMIN D3) 25 MCG (1000 UNIT) tablet Take 1,000 Units by mouth daily.     cyclobenzaprine (FLEXERIL) 5 MG tablet Take 1-2 tablets (5-10 mg total) by mouth 3 (three) times daily as needed for muscle spasms. 15 tablet 1   fenofibrate micronized (LOFIBRA) 134 MG capsule Take 1 capsule (134 mg total) by mouth daily before breakfast. (Patient not taking: Reported on 05/05/2021) 90 capsule 3   ferrous sulfate 325 (65 FE) MG tablet Take 325 mg by mouth daily with breakfast.     insulin aspart (NOVOLOG) 100 UNIT/ML injection Inject into the skin 3 (three) times daily before meals. (Patient not taking: Reported on 05/05/2021)     Multiple Vitamin (MULTIVITAMIN) tablet Take 1 tablet by mouth daily.     Omega 3 1000 MG CAPS Take by mouth daily.     omeprazole (PRILOSEC) 20 MG capsule Take 1 capsule (20 mg total) by mouth daily. (Patient not taking: Reported on 05/05/2021) 90 capsule 1   OZEMPIC, 1 MG/DOSE, 4 MG/3ML SOPN INJECT 1 ML SUBCUTANEOUSLY ONCE A WEEK 9 mL 3   rosuvastatin (CRESTOR) 5 MG tablet Take 1 tablet (5 mg total) by mouth daily. 15 tablet 3   VITAMIN E PO Take by mouth daily.     Current Facility-Administered Medications  Medication Dose Route Frequency Provider Last Rate Last Admin   0.9 %  sodium chloride infusion  500 mL Intravenous Once Carolyn Lucks, Carolyn Butler        Allergies as of 05/05/2021 - Review Complete 05/05/2021  Allergen Reaction Noted   Doxycycline Hives 02/03/2012    Family History  Problem Relation Age of Onset   Diabetes Father    Heart disease Father    Hyperlipidemia Father    Diabetes Brother    Colon cancer Paternal Uncle    Cancer - Colon Paternal Uncle 85   Diabetes Paternal Grandmother    Heart disease Paternal Grandmother    Colon polyps Neg Hx    Esophageal cancer Neg Hx    Rectal cancer Neg Hx     Stomach cancer Neg Hx     Social History   Socioeconomic History   Marital status: Married    Spouse name: Carolyn Butler   Number of children: 0   Years of education: 12+   Highest education level: Not on file  Occupational History   Occupation: unemployed    Comment: Psychologist, occupational  Tobacco Use   Smoking status: Never   Smokeless tobacco: Never  Vaping Use   Vaping Use: Some days   Devices: CBD oil  Substance and  Sexual Activity   Alcohol use: Yes    Comment: twice a year mixed drink or red wine  occ   Drug use: Yes    Types: Marijuana    Comment: occasionally   Sexual activity: Yes  Other Topics Concern   Not on file  Social History Narrative   Lives with her husband.   Laid-off from job at [employer] (2014), and hasn't returned to work.   Social Determinants of Health   Financial Resource Strain: Not on file  Food Insecurity: Not on file  Transportation Needs: Not on file  Physical Activity: Not on file  Stress: Not on file  Social Connections: Not on file  Intimate Partner Violence: Not on file    Review of Systems: All other review of systems negative except as mentioned in the HPI.  Physical Exam: Vital signs BP 120/74   Pulse 80   Temp 98.6 F (37 C) (Temporal)   Ht 5\' 7"  (1.702 m)   Wt 190 lb (86.2 kg)   SpO2 100%   BMI 29.76 kg/m   General:   Alert,  Well-developed, well-nourished, pleasant and cooperative in NAD Lungs:  Clear throughout to auscultation.   Heart:  Regular rate and rhythm; no murmurs, clicks, rubs,  or gallops. Abdomen:  Soft, nontender and nondistended. Normal bowel sounds.   Neuro/Psych:  Normal mood and affect. A and O x 3   Carolyn Gunnels E. , Carolyn Butler [facility]

## 2021-05-05 NOTE — Progress Notes (Signed)
Sedate, gd SR, tolerated procedure well, VSS, report to RN 

## 2021-05-05 NOTE — Op Note (Signed)
[facility] Patient Name: Carolyn Butler Procedure Date: 05/05/2021 4:41 PM MRN: [id number] Endoscopist: Lorin Picket E. Tomasa Rand , MD Age: 50 Referring MD:  Date of Birth: 1971/01/18 Gender: Female Account #: [id number] Procedure:                Colonoscopy Indications:              Screening for colorectal malignant neoplasm, This                            is the patient's first colonoscopy Medicines:                Monitored Anesthesia Care Procedure:                Pre-Anesthesia Assessment:                           - Prior to the procedure, a History and Physical                            was performed, and patient medications and                            allergies were reviewed. The patient's tolerance of                            previous anesthesia was also reviewed. The risks                            and benefits of the procedure and the sedation                            options and risks were discussed with the patient.                            All questions were answered, and informed consent                            was obtained. Prior Anticoagulants: The patient has                            taken no previous anticoagulant or antiplatelet                            agents. ASA Grade Assessment: II - A patient with                            mild systemic disease. After reviewing the risks                            and benefits, the patient was deemed in                            satisfactory condition to undergo the procedure.  After obtaining informed consent, the colonoscope                            was passed under direct vision. Throughout the                            procedure, the patient's blood pressure, pulse, and                            oxygen saturations were monitored continuously. The                            PCF-HQ190L Colonoscope was introduced through the                            anus and advanced  to the the cecum, identified by                            appendiceal orifice and ileocecal valve. The                            colonoscopy was performed without difficulty. The                            patient tolerated the procedure well. The quality                            of the bowel preparation was adequate. The                            ileocecal valve, appendiceal orifice, and rectum                            were photographed. Scope In: 4:51:56 PM Scope Out: 5:03:57 PM Scope Withdrawal Time: 0 hours 8 minutes 26 seconds  Total Procedure Duration: 0 hours 12 minutes 1 second  Findings:                 The perianal and digital rectal examinations were                            normal. Pertinent negatives include normal                            sphincter tone and no palpable rectal lesions.                           The colon (entire examined portion) appeared normal.                           The retroflexed view of the distal rectum and anal                            verge was normal and showed no anal or rectal  abnormalities. Complications:            No immediate complications. Estimated Blood Loss:     Estimated blood loss: none. Impression:               - The entire examined colon is normal.                           - The distal rectum and anal verge are normal on                            retroflexion view.                           - No specimens collected. Recommendation:           - Patient has a contact number available for                            emergencies. The signs and symptoms of potential                            delayed complications were discussed with the                            patient. Return to normal activities tomorrow.                            Written discharge instructions were provided to the                            patient.                           - Resume previous diet.                            - Continue present medications.                           - Repeat colonoscopy in 10 years for screening                            purposes. Shep Porter E. Tomasa Rand, MD 05/05/2021 5:08:26 PM This report has been signed electronically.

## 2021-05-07 ENCOUNTER — Telehealth: Payer: Self-pay

## 2021-05-07 ENCOUNTER — Telehealth: Payer: Self-pay | Admitting: *Deleted

## 2021-05-07 NOTE — Telephone Encounter (Signed)
First attempt follow up call to pt, lm on vm 

## 2021-05-07 NOTE — Telephone Encounter (Signed)
No answer for post procedure call back. No voicemail available.

## 2021-05-26 ENCOUNTER — Other Ambulatory Visit: Payer: Self-pay | Admitting: Internal Medicine

## 2021-05-26 DIAGNOSIS — E1165 Type 2 diabetes mellitus with hyperglycemia: Secondary | ICD-10-CM

## 2021-05-26 DIAGNOSIS — I10 Essential (primary) hypertension: Secondary | ICD-10-CM

## 2021-06-28 ENCOUNTER — Other Ambulatory Visit: Payer: Self-pay | Admitting: Internal Medicine

## 2021-06-28 DIAGNOSIS — I10 Essential (primary) hypertension: Secondary | ICD-10-CM

## 2021-06-29 ENCOUNTER — Ambulatory Visit (INDEPENDENT_AMBULATORY_CARE_PROVIDER_SITE_OTHER): Payer: BC Managed Care – PPO | Admitting: Family Medicine

## 2021-06-29 ENCOUNTER — Encounter: Payer: Self-pay | Admitting: Family Medicine

## 2021-06-29 VITALS — BP 128/78 | HR 87 | Temp 98.3°F | Resp 17 | Ht 67.0 in | Wt 196.0 lb

## 2021-06-29 DIAGNOSIS — I10 Essential (primary) hypertension: Secondary | ICD-10-CM

## 2021-06-29 DIAGNOSIS — F418 Other specified anxiety disorders: Secondary | ICD-10-CM | POA: Diagnosis not present

## 2021-06-29 DIAGNOSIS — E785 Hyperlipidemia, unspecified: Secondary | ICD-10-CM

## 2021-06-29 DIAGNOSIS — M62830 Muscle spasm of back: Secondary | ICD-10-CM | POA: Diagnosis not present

## 2021-06-29 MED ORDER — FLUOXETINE HCL 10 MG PO CAPS: 10.0000 mg | ORAL_CAPSULE | Freq: Every day | ORAL | 1 refills | Status: DC

## 2021-06-29 MED ORDER — CYCLOBENZAPRINE HCL 5 MG PO TABS: 5.0000 mg | ORAL_TABLET | Freq: Three times a day (TID) | ORAL | 1 refills | Status: DC | PRN

## 2021-06-29 MED ORDER — LOSARTAN POTASSIUM 50 MG PO TABS: 50.0000 mg | ORAL_TABLET | Freq: Every day | ORAL | 1 refills | Status: DC

## 2021-06-29 MED ORDER — ROSUVASTATIN CALCIUM 5 MG PO TABS: 5.0000 mg | ORAL_TABLET | Freq: Every day | ORAL | 3 refills | Status: DC

## 2021-06-29 MED ORDER — QUETIAPINE FUMARATE 25 MG PO TABS: 12.5000 mg | ORAL_TABLET | Freq: Every day | ORAL | 1 refills | Status: DC

## 2021-06-29 NOTE — Progress Notes (Signed)
Subjective:  Patient ID: Carolyn Butler, female    DOB: 10-12-1970  Age: 50 y.o. MRN: [id number]  CC:  Chief Complaint  Patient presents with   Hypertension    Pt here for follow up no concerns, pt has been having headaches but reports this has always been an issue.    Hyperlipidemia    Pt here for follow up no concerns today    Depression    PHQ9 score of 13     HPI Carolyn Butler presents for   Hypertension: Losartan 50 mg daily Home readings: 120/70 range.  BP Readings from Last 3 Encounters:  06/29/21 128/78  05/05/21 112/79  04/02/21 128/88   Lab Results  Component Value Date   CREATININE 0.61 10/22/2020   Hyperlipidemia: Crestor 5 mg daily History of overweight/obesity with BMI 30.7. Wt Readings from Last 3 Encounters:  06/29/21 196 lb (88.9 kg)  05/05/21 190 lb (86.2 kg)  04/24/21 190 lb (86.2 kg)  Fast food/sweet tea/soda:none Exercise: increased recently.  Lab Results  Component Value Date   CHOL 194 10/22/2020   HDL 35 (L) 10/22/2020   LDLCALC 128 (H) 10/22/2020   LDLDIRECT 140.0 03/27/2020   TRIG 171 (H) 10/22/2020   CHOLHDL 5.5 (H) 10/22/2020   Lab Results  Component Value Date   ALT 23 10/22/2020   AST 15 10/22/2020   ALKPHOS 30 (L) 10/22/2020   BILITOT 0.4 10/22/2020   Depression: Previously prescribed lexapro, celexa in 2020. Did not take. Concerned about addiction.  Remote meeting with therapist for anger issues in past. Some anxiety at times.  No manic symptoms, but FH of Bipolar disorder.  Trouble getting to sleep and staying asleep at times.  Traveling to [place] for holidays. Looking forward to that trip.  No SI/HI.   Depression screen [facility] 2/9 06/29/2021 10/17/2020 07/04/2020 03/27/2020 05/29/2019  Decreased Interest 3 0 0 0 3  Down, Depressed, Hopeless 1 0 0 0 2  PHQ - 2 Score 4 0 0 0 5  Altered sleeping 3 - - - 3  Tired, decreased energy 3 - - - 3  Change in appetite 3 - - - 2  Feeling bad or failure about yourself  0 - - - 1   Trouble concentrating 0 - - - 0  Moving slowly or fidgety/restless 0 - - - 2  Suicidal thoughts 0 - - - 0  PHQ-9 Score 13 - - - 16  Difficult doing work/chores - - - - Very difficult    Back pain: Spasm in past - pulled with golfing last month. Better, some days sore. MM relaxer in past.  No bowel or bladder incontinence, no saddle anesthesia, no lower extremity weakness. No fever, weight loss.    Type 2 diabetes with hyperglycemia Treated by endocrinology, Dr. Elvera Lennox Lab Results  Component Value Date   HGBA1C 5.5 04/02/2021  She is on ARB, statin. Declined flu vaccine   History Patient Active Problem List   Diagnosis Date Noted   Tension headache 07/06/2018   Muscle spasm 07/06/2018   Controlled diabetes mellitus with long-term current use of insulin (HCC) 08/14/2015   Pseudohyponatremia 12/14/2012   Hypertriglyceridemia 12/14/2012   Benign essential HTN 11/20/2011   Obesity, Class II, BMI 35-39.9 11/20/2011   Sleep disturbance 11/20/2011   Past Medical History:  Diagnosis Date   Allergy    Anemia    Anxiety    Arthritis    Depression    Diabetes mellitus without complication (HCC)  GERD (gastroesophageal reflux disease)    Hyperlipidemia    Hypertension    Neuromuscular disorder (HCC)    told has Fibro after injury   PONV (postoperative nausea and vomiting)    Past Surgical History:  Procedure Laterality Date   COLONOSCOPY  05/05/2021   DILATION AND CURETTAGE OF UTERUS     INDUCED ABORTION     MOUTH SURGERY  2022   2 caps removed   surgery on right knee     TOOTH EXTRACTION  10/2020   Allergies  Allergen Reactions   Doxycycline Hives   Prior to Admission medications   Medication Sig Start Date End Date Taking? Authorizing Provider  BD PEN NEEDLE NANO 2ND GEN 32G X 4 MM MISC USE 1 PEN NEEDLE 4 TIMES DAILY 02/27/21  Yes Shade Flood, MD  cholecalciferol (VITAMIN D3) 25 MCG (1000 UNIT) tablet Take 1,000 Units by mouth daily.   Yes [provider]  cyclobenzaprine (FLEXERIL) 5 MG tablet Take 1-2 tablets (5-10 mg total) by mouth 3 (three) times daily as needed for muscle spasms. 07/04/20  Yes Shade Flood, MD  ferrous sulfate 325 (65 FE) MG tablet Take 325 mg by mouth daily with breakfast.   Yes [provider]  insulin detemir (LEVEMIR FLEXTOUCH) 100 UNIT/ML FlexPen INJECT 30 UNITS SUBCUTANEOUSLY ONCE DAILY AT NIGHT AT BEDTIME Patient taking differently: 25 Units. INJECT 30 UNITS SUBCUTANEOUSLY ONCE DAILY AT NIGHT AT BEDTIME 09/03/20  Yes Carlus Pavlov, MD  Lancets [facility] ULTRASOFT) lancets Use as instructed. Test blood glucose bid. E11.9 11/09/18  Yes Doristine Bosworth, MD  losartan (COZAAR) 50 MG tablet Take 1 tablet by mouth once daily 06/29/21  Yes Carlus Pavlov, MD  metFORMIN (GLUCOPHAGE) 1000 MG tablet Take 1 tablet by mouth twice daily 05/27/21  Yes Carlus Pavlov, MD  Multiple Vitamin (MULTIVITAMIN) tablet Take 1 tablet by mouth daily.   Yes [provider]  Omega 3 1000 MG CAPS Take by mouth daily.   Yes [provider]  OZEMPIC, 1 MG/DOSE, 4 MG/3ML SOPN INJECT 1 ML SUBCUTANEOUSLY ONCE A WEEK 04/14/21  Yes Carlus Pavlov, MD  rosuvastatin (CRESTOR) 5 MG tablet Take 1 tablet (5 mg total) by mouth daily. 04/02/21  Yes Carlus Pavlov, MD  VITAMIN E PO Take by mouth daily.   Yes [provider]  fenofibrate micronized (LOFIBRA) 134 MG capsule Take 1 capsule (134 mg total) by mouth daily before breakfast. Patient not taking: Reported on 05/05/2021 09/25/20 09/20/21  Carlus Pavlov, MD  insulin aspart (NOVOLOG) 100 UNIT/ML injection Inject into the skin 3 (three) times daily before meals. Patient not taking: Reported on 05/05/2021    [provider]  omeprazole (PRILOSEC) 20 MG capsule Take 1 capsule (20 mg total) by mouth daily. Patient not taking: Reported on 05/05/2021 08/14/20   Just, Azalee Course, FNP   Social History   Socioeconomic History   Marital status:  Married    Spouse name: Carolyn Butler   Number of children: 0   Years of education: 12+   Highest education level: Not on file  Occupational History   Occupation: unemployed    Comment: Psychologist, occupational  Tobacco Use   Smoking status: Never   Smokeless tobacco: Never  Vaping Use   Vaping Use: Some days   Devices: CBD oil  Substance and Sexual Activity   Alcohol use: Yes    Comment: twice a year mixed drink or red wine  occ   Drug use: Yes    Types:  Marijuana    Comment: occasionally   Sexual activity: Yes  Other Topics Concern   Not on file  Social History Narrative   Lives with her husband.   Laid-off from job at [employer] (2014), and hasn't returned to work.   Social Determinants of Health   Financial Resource Strain: Not on file  Food Insecurity: Not on file  Transportation Needs: Not on file  Physical Activity: Not on file  Stress: Not on file  Social Connections: Not on file  Intimate Partner Violence: Not on file    Review of Systems  Constitutional:  Negative for fatigue and unexpected weight change.  Respiratory:  Negative for chest tightness and shortness of breath.   Cardiovascular:  Negative for chest pain, palpitations and leg swelling.  Gastrointestinal:  Negative for abdominal pain and blood in stool.  Neurological:  Positive for headaches (occasional  not new.). Negative for dizziness, syncope and light-headedness.    Objective:   Vitals:   06/29/21 1333  BP: 128/78  Pulse: 87  Resp: 17  Temp: 98.3 F (36.8 C)  TempSrc: Temporal  SpO2: 98%  Weight: 196 lb (88.9 kg)  Height:  (1.702 m)     Physical Exam Vitals reviewed.  Constitutional:      Appearance: Normal appearance. She is well-developed.  HENT:     Head: Normocephalic and atraumatic.  Eyes:     Conjunctiva/sclera: Conjunctivae normal.     Pupils: Pupils are equal, round, and reactive to light.  Neck:     Vascular: No carotid bruit.  Cardiovascular:     Rate and Rhythm:  Normal rate and regular rhythm.     Heart sounds: Normal heart sounds.  Pulmonary:     Effort: Pulmonary effort is normal.     Breath sounds: Normal breath sounds.  Abdominal:     Palpations: Abdomen is soft. There is no pulsatile mass.     Tenderness: There is no abdominal tenderness.  Musculoskeletal:     Right lower leg: No edema.     Left lower leg: No edema.  Skin:    General: Skin is warm and dry.  Neurological:     Mental Status: She is alert and oriented to person, place, and time.  Psychiatric:        Mood and Affect: Mood normal.        Behavior: Behavior normal.        Thought Content: Thought content normal. Thought content does not include homicidal or suicidal ideation.       Assessment & Plan:  Carolyn Butler is a 50 y.o. female . Depression with anxiety - Plan: QUEtiapine (SEROQUEL) 25 MG tablet, FLUoxetine (PROZAC) 10 MG capsule, TSH  -Persistent symptoms, including with sleep.  Start fluoxetine, as concern for weight gain.  Potential side effects discussed as well as timing for typical symptom improvement.  Seroquel if needed at bedtime.  Monitor for any mania/hypomania symptoms given family history of bipolar disorder and consider psychiatry eval.  Phone numbers provided for counseling.  Recheck 6 weeks.  Benign essential HTN - Plan: losartan (COZAAR) 50 MG tablet, Comprehensive metabolic panel  -Stable, continue losartan same dose.  Muscle spasm of back - Plan: cyclobenzaprine (FLEXERIL) 5 MG tablet  -With recent strain, low-dose Flexeril discussed with potential side effects discussed and caution with combining with other sedating medicines, especially Seroquel.  Handout on back pain and home treatment with RTC precautions.  Hyperlipidemia, unspecified hyperlipidemia type - Plan: rosuvastatin (CRESTOR) 5 MG  tablet, Comprehensive metabolic panel, Lipid panel  -Tolerating current regimen, continue same, updated labs ordered.  Continue follow-up with  endocrinology.  Meds ordered this encounter  Medications   QUEtiapine (SEROQUEL) 25 MG tablet    Sig: Take 0.5-1 tablets (12.5-25 mg total) by mouth at bedtime.    Dispense:  30 tablet    Refill:  1   FLUoxetine (PROZAC) 10 MG capsule    Sig: Take 1 capsule (10 mg total) by mouth daily.    Dispense:  90 capsule    Refill:  1   losartan (COZAAR) 50 MG tablet    Sig: Take 1 tablet (50 mg total) by mouth daily.    Dispense:  90 tablet    Refill:  1   rosuvastatin (CRESTOR) 5 MG tablet    Sig: Take 1 tablet (5 mg total) by mouth daily.    Dispense:  15 tablet    Refill:  3   cyclobenzaprine (FLEXERIL) 5 MG tablet    Sig: Take 1-2 tablets (5-10 mg total) by mouth 3 (three) times daily as needed for muscle spasms.    Dispense:  15 tablet    Refill:  1   Patient Instructions  I would consider meeting with therapist again. Here are some options: Here are a few options for counseling:  [facility]:  [phone]  [facility] [phone]  I recommend starting  fluoxetine once per day and recheck in 6 weeks. If any manic symptoms, be seen right away. Seroquel if needed for sleep for now.   Flexeril if needed for back but recommend not combining with seroquel. Return to the clinic or go to the nearest emergency room if any of your symptoms worsen or new symptoms occur.  Acute Back Pain, Adult Acute back pain is sudden and usually short-lived. It is often caused by an injury to the muscles and tissues in the back. The injury may result from: A muscle, tendon, or ligament getting overstretched or torn. Ligaments are tissues that connect bones to each other. Lifting something improperly can cause a back strain. Wear and tear (degeneration) of the spinal disks. Spinal disks are circular tissue that provide cushioning between the bones of the spine (vertebrae). Twisting motions, such as while playing sports or doing yard work. A hit to the  back. Arthritis. You may have a physical exam, lab tests, and imaging tests to find the cause of your pain. Acute back pain usually goes away with rest and home care. Follow these instructions at home: Managing pain, stiffness, and swelling Take over-the-counter and prescription medicines only as told by your health care provider. Treatment may include medicines for pain and inflammation that are taken by mouth or applied to the skin, or muscle relaxants. Your health care provider may recommend applying ice during the first 24-48 hours after your pain starts. To do this: Put ice in a plastic bag. Place a towel between your skin and the bag. Leave the ice on for 20 minutes, 2-3 times a day. Remove the ice if your skin turns bright red. This is very important. If you cannot feel pain, heat, or cold, you have a greater risk of damage to the area. If directed, apply heat to the affected area as often as told by your health care provider. Use the heat source that your health care provider recommends, such as a moist heat pack or a heating pad. Place a towel between your skin and the heat source. Leave the heat on  for 20-30 minutes. Remove the heat if your skin turns bright red. This is especially important if you are unable to feel pain, heat, or cold. You have a greater risk of getting burned. Activity  Do not stay in bed. Staying in bed for more than 1-2 days can delay your recovery. Sit up and stand up straight. Avoid leaning forward when you sit or hunching over when you stand. If you work at a desk, sit close to it so you do not need to lean over. Keep your chin tucked in. Keep your neck drawn back, and keep your elbows bent at a 90-degree angle (right angle). Sit high and close to the steering wheel when you drive. Add lower back (lumbar) support to your car seat, if needed. Take short walks on even surfaces as soon as you are able. Try to increase the length of time you walk each day. Do not  sit, drive, or stand in one place for more than 30 minutes at a time. Sitting or standing for long periods of time can put stress on your back. Do not drive or use heavy machinery while taking prescription pain medicine. Use proper lifting techniques. When you bend and lift, use positions that put less stress on your back: [place] your knees. Keep the load close to your body. Avoid twisting. Exercise regularly as told by your health care provider. Exercising helps your back heal faster and helps prevent back injuries by keeping muscles strong and flexible. Work with a physical therapist to make a safe exercise program, as recommended by your health care provider. Do any exercises as told by your physical therapist. Lifestyle Maintain a healthy weight. Extra weight puts stress on your back and makes it difficult to have good posture. Avoid activities or situations that make you feel anxious or stressed. Stress and anxiety increase muscle tension and can make back pain worse. Learn ways to manage anxiety and stress, such as through exercise. General instructions Sleep on a firm mattress in a comfortable position. Try lying on your side with your knees slightly bent. If you lie on your back, put a pillow under your knees. Keep your head and neck in a straight line with your spine (neutral position) when using electronic equipment like smartphones or pads. To do this: Raise your smartphone or pad to look at it instead of bending your head or neck to look down. Put the smartphone or pad at the level of your face while looking at the screen. Follow your treatment plan as told by your health care provider. This may include: Cognitive or behavioral therapy. Acupuncture or massage therapy. Meditation or yoga. Contact a health care provider if: You have pain that is not relieved with rest or medicine. You have increasing pain going down into your legs or buttocks. Your pain does not improve after 2  weeks. You have pain at night. You lose weight without trying. You have a fever or chills. You develop nausea or vomiting. You develop abdominal pain. Get help right away if: You develop new bowel or bladder control problems. You have unusual weakness or numbness in your arms or legs. You feel faint. These symptoms may represent a serious problem that is an emergency. Do not wait to see if the symptoms will go away. Get medical help right away. Call your local emergency services (911 in the [place] Do not drive yourself to the hospital. Summary Acute back pain is sudden and usually short-lived. Use proper lifting techniques. When  you bend and lift, use positions that put less stress on your back. Take over-the-counter and prescription medicines only as told by your health care provider, and apply heat or ice as told. This information is not intended to replace advice given to you by your health care provider. Make sure you discuss any questions you have with your health care provider. Document Revised: 10/03/2020 Document Reviewed: 10/03/2020 Elsevier Patient Education  2022 Elsevier Inc.   Managing Depression, Adult Depression is a mental health condition that affects your thoughts, feelings, and actions. Being diagnosed with depression can bring you relief if you did not know why you have felt or behaved a certain way. It could also leave you feeling overwhelmed with uncertainty about your future. Preparing yourself to manage your symptoms can help you feel more positive about your future. How to manage lifestyle changes Managing stress Stress is your body's reaction to life changes and events, both good and bad. Stress can add to your feelings of depression. Learning to manage your stress can help lessen your feelings of depression. Try some of the following approaches to reducing your stress (stress reduction techniques): Listen to music that you enjoy and that inspires you. Try using  a meditation app or take a meditation class. Develop a practice that helps you connect with your spiritual self. Walk in nature, pray, or go to a place of worship. Do some deep breathing. To do this, inhale slowly through your nose. Pause at the top of your inhale for a few seconds and then exhale slowly, letting your muscles relax. Practice yoga to help relax and work your muscles. Choose a stress reduction technique that suits your lifestyle and personality. These techniques take time and practice to develop. Set aside 5-15 minutes a day to do them. Therapists can offer training in these techniques. Other things you can do to manage stress include: Keeping a stress diary. Knowing your limits and saying no when you think something is too much. Paying attention to how you react to certain situations. You may not be able to control everything, but you can change your reaction. Adding humor to your life by watching funny films or TV shows. Making time for activities that you enjoy and that relax you.  Medicines Medicines, such as antidepressants, are often a part of treatment for depression. Talk with your pharmacist or health care provider about all the medicines, supplements, and herbal products that you take, their possible side effects, and what medicines and other products are safe to take together. Make sure to report any side effects you may have to your health care provider. Relationships Your health care provider may suggest family therapy, couples therapy, or individual therapy as part of your treatment. How to recognize changes Everyone responds differently to treatment for depression. As you recover from depression, you may start to: Have more interest in doing activities. Feel less hopeless. Have more energy. Overeat less often, or have a better appetite. Have better mental focus. It is important to recognize if your depression is not getting better or is getting worse. The  symptoms you had in the beginning may return, such as: Tiredness (fatigue) or low energy. Eating too much or too little. Sleeping too much or too little. Feeling restless, agitated, or hopeless. Trouble focusing or making decisions. Unexplained physical complaints. Feeling irritable, angry, or aggressive. If you or your family members notice these symptoms coming back, let your health care provider know right away. Follow these instructions at home: Activity  Try to get some form of exercise each day, such as walking, biking, swimming, or lifting weights. Practice stress reduction techniques. Engage your mind by taking a class or doing some volunteer work. Lifestyle Get the right amount and quality of sleep. Cut down on using caffeine, tobacco, alcohol, and other potentially harmful substances. Eat a healthy diet that includes plenty of vegetables, fruits, whole grains, low-fat dairy products, and lean protein. Do not eat a lot of foods that are high in solid fats, added sugars, or salt (sodium). General instructions Take over-the-counter and prescription medicines only as told by your health care provider. Keep all follow-up visits as told by your health care provider. This is important. Where to find support Talking to others Friends and family members can be sources of support and guidance. Talk to trusted friends or family members about your condition. Explain your symptoms to them, and let them know that you are working with a health care provider to treat your depression. Tell friends and family members how they also can be helpful. Finances Find appropriate mental health providers that fit with your financial situation. Talk with your health care provider about options to get reduced prices on your medicines. Where to find more information You can find support in your area from: Anxiety and Depression Association of America (ADAA): www.adaa.org Mental Health America:  www.mentalhealthamerica.net [employer] on Mental Illness: www.nami.org Contact a health care provider if: You stop taking your antidepressant medicines, and you have any of these symptoms: Nausea. Headache. Light-headedness. Chills and body aches. Not being able to sleep (insomnia). You or your friends and family think your depression is getting worse. Get help right away if: You have thoughts of hurting yourself or others. If you ever feel like you may hurt yourself or others, or have thoughts about taking your own life, get help right away. Go to your nearest emergency department or: Call your local emergency services (911 in the [place] Call a suicide crisis helpline, such as the National Suicide Prevention Lifeline at 807-737-8052 or 988 in the [place] This is open 24 hours a day in the [place] Text the Crisis Text Line at 601-255-1918 (in the [place] Summary If you are diagnosed with depression, preparing yourself to manage your symptoms is a good way to feel positive about your future. Work with your health care provider on a management plan that includes stress reduction techniques, medicines (if applicable), therapy, and healthy lifestyle habits. Keep talking with your health care provider about how your treatment is working. If you have thoughts about taking your own life, call a suicide crisis helpline or text a crisis text line. This information is not intended to replace advice given to you by your health care provider. Make sure you discuss any questions you have with your health care provider. Document Revised: 02/04/2021 Document Reviewed: 05/23/2019 Elsevier Patient Education  2022 Elsevier Inc.    Signed,   Meredith Staggers, MD [facility], [facility] 06/29/21 2:25 PM

## 2021-06-29 NOTE — Patient Instructions (Addendum)
I would consider meeting with therapist again. Here are some options: Here are a few options for counseling:  [facility]:  [phone]  [facility] [phone]  I recommend starting  fluoxetine once per day and recheck in 6 weeks. If any manic symptoms, be seen right away. Seroquel if needed for sleep for now.   Flexeril if needed for back but recommend not combining with seroquel. Return to the clinic or go to the nearest emergency room if any of your symptoms worsen or new symptoms occur.  Acute Back Pain, Adult Acute back pain is sudden and usually short-lived. It is often caused by an injury to the muscles and tissues in the back. The injury may result from: A muscle, tendon, or ligament getting overstretched or torn. Ligaments are tissues that connect bones to each other. Lifting something improperly can cause a back strain. Wear and tear (degeneration) of the spinal disks. Spinal disks are circular tissue that provide cushioning between the bones of the spine (vertebrae). Twisting motions, such as while playing sports or doing yard work. A hit to the back. Arthritis. You may have a physical exam, lab tests, and imaging tests to find the cause of your pain. Acute back pain usually goes away with rest and home care. Follow these instructions at home: Managing pain, stiffness, and swelling Take over-the-counter and prescription medicines only as told by your health care provider. Treatment may include medicines for pain and inflammation that are taken by mouth or applied to the skin, or muscle relaxants. Your health care provider may recommend applying ice during the first 24-48 hours after your pain starts. To do this: Put ice in a plastic bag. Place a towel between your skin and the bag. Leave the ice on for 20 minutes, 2-3 times a day. Remove the ice if your skin turns bright red. This is very important. If you cannot feel pain, heat, or  cold, you have a greater risk of damage to the area. If directed, apply heat to the affected area as often as told by your health care provider. Use the heat source that your health care provider recommends, such as a moist heat pack or a heating pad. Place a towel between your skin and the heat source. Leave the heat on for 20-30 minutes. Remove the heat if your skin turns bright red. This is especially important if you are unable to feel pain, heat, or cold. You have a greater risk of getting burned. Activity  Do not stay in bed. Staying in bed for more than 1-2 days can delay your recovery. Sit up and stand up straight. Avoid leaning forward when you sit or hunching over when you stand. If you work at a desk, sit close to it so you do not need to lean over. Keep your chin tucked in. Keep your neck drawn back, and keep your elbows bent at a 90-degree angle (right angle). Sit high and close to the steering wheel when you drive. Add lower back (lumbar) support to your car seat, if needed. Take short walks on even surfaces as soon as you are able. Try to increase the length of time you walk each day. Do not sit, drive, or stand in one place for more than 30 minutes at a time. Sitting or standing for long periods of time can put stress on your back. Do not drive or use heavy machinery while taking prescription pain medicine. Use proper lifting techniques. When you bend and lift,  use positions that put less stress on your back: [place] your knees. Keep the load close to your body. Avoid twisting. Exercise regularly as told by your health care provider. Exercising helps your back heal faster and helps prevent back injuries by keeping muscles strong and flexible. Work with a physical therapist to make a safe exercise program, as recommended by your health care provider. Do any exercises as told by your physical therapist. Lifestyle Maintain a healthy weight. Extra weight puts stress on your back and  makes it difficult to have good posture. Avoid activities or situations that make you feel anxious or stressed. Stress and anxiety increase muscle tension and can make back pain worse. Learn ways to manage anxiety and stress, such as through exercise. General instructions Sleep on a firm mattress in a comfortable position. Try lying on your side with your knees slightly bent. If you lie on your back, put a pillow under your knees. Keep your head and neck in a straight line with your spine (neutral position) when using electronic equipment like smartphones or pads. To do this: Raise your smartphone or pad to look at it instead of bending your head or neck to look down. Put the smartphone or pad at the level of your face while looking at the screen. Follow your treatment plan as told by your health care provider. This may include: Cognitive or behavioral therapy. Acupuncture or massage therapy. Meditation or yoga. Contact a health care provider if: You have pain that is not relieved with rest or medicine. You have increasing pain going down into your legs or buttocks. Your pain does not improve after 2 weeks. You have pain at night. You lose weight without trying. You have a fever or chills. You develop nausea or vomiting. You develop abdominal pain. Get help right away if: You develop new bowel or bladder control problems. You have unusual weakness or numbness in your arms or legs. You feel faint. These symptoms may represent a serious problem that is an emergency. Do not wait to see if the symptoms will go away. Get medical help right away. Call your local emergency services (911 in the [place] Do not drive yourself to the hospital. Summary Acute back pain is sudden and usually short-lived. Use proper lifting techniques. When you bend and lift, use positions that put less stress on your back. Take over-the-counter and prescription medicines only as told by your health care provider, and  apply heat or ice as told. This information is not intended to replace advice given to you by your health care provider. Make sure you discuss any questions you have with your health care provider. Document Revised: 10/03/2020 Document Reviewed: 10/03/2020 Elsevier Patient Education  2022 Elsevier Inc.   Managing Depression, Adult Depression is a mental health condition that affects your thoughts, feelings, and actions. Being diagnosed with depression can bring you relief if you did not know why you have felt or behaved a certain way. It could also leave you feeling overwhelmed with uncertainty about your future. Preparing yourself to manage your symptoms can help you feel more positive about your future. How to manage lifestyle changes Managing stress Stress is your body's reaction to life changes and events, both good and bad. Stress can add to your feelings of depression. Learning to manage your stress can help lessen your feelings of depression. Try some of the following approaches to reducing your stress (stress reduction techniques): Listen to music that you enjoy and that inspires you. Try  using a meditation app or take a meditation class. Develop a practice that helps you connect with your spiritual self. Walk in nature, pray, or go to a place of worship. Do some deep breathing. To do this, inhale slowly through your nose. Pause at the top of your inhale for a few seconds and then exhale slowly, letting your muscles relax. Practice yoga to help relax and work your muscles. Choose a stress reduction technique that suits your lifestyle and personality. These techniques take time and practice to develop. Set aside 5-15 minutes a day to do them. Therapists can offer training in these techniques. Other things you can do to manage stress include: Keeping a stress diary. Knowing your limits and saying no when you think something is too much. Paying attention to how you react to certain  situations. You may not be able to control everything, but you can change your reaction. Adding humor to your life by watching funny films or TV shows. Making time for activities that you enjoy and that relax you.  Medicines Medicines, such as antidepressants, are often a part of treatment for depression. Talk with your pharmacist or health care provider about all the medicines, supplements, and herbal products that you take, their possible side effects, and what medicines and other products are safe to take together. Make sure to report any side effects you may have to your health care provider. Relationships Your health care provider may suggest family therapy, couples therapy, or individual therapy as part of your treatment. How to recognize changes Everyone responds differently to treatment for depression. As you recover from depression, you may start to: Have more interest in doing activities. Feel less hopeless. Have more energy. Overeat less often, or have a better appetite. Have better mental focus. It is important to recognize if your depression is not getting better or is getting worse. The symptoms you had in the beginning may return, such as: Tiredness (fatigue) or low energy. Eating too much or too little. Sleeping too much or too little. Feeling restless, agitated, or hopeless. Trouble focusing or making decisions. Unexplained physical complaints. Feeling irritable, angry, or aggressive. If you or your family members notice these symptoms coming back, let your health care provider know right away. Follow these instructions at home: Activity  Try to get some form of exercise each day, such as walking, biking, swimming, or lifting weights. Practice stress reduction techniques. Engage your mind by taking a class or doing some volunteer work. Lifestyle Get the right amount and quality of sleep. Cut down on using caffeine, tobacco, alcohol, and other potentially harmful  substances. Eat a healthy diet that includes plenty of vegetables, fruits, whole grains, low-fat dairy products, and lean protein. Do not eat a lot of foods that are high in solid fats, added sugars, or salt (sodium). General instructions Take over-the-counter and prescription medicines only as told by your health care provider. Keep all follow-up visits as told by your health care provider. This is important. Where to find support Talking to others Friends and family members can be sources of support and guidance. Talk to trusted friends or family members about your condition. Explain your symptoms to them, and let them know that you are working with a health care provider to treat your depression. Tell friends and family members how they also can be helpful. Finances Find appropriate mental health providers that fit with your financial situation. Talk with your health care provider about options to get reduced prices on your  medicines. Where to find more information You can find support in your area from: Anxiety and Depression Association of America (ADAA): www.adaa.org Mental Health America: www.mentalhealthamerica.net [employer] on Mental Illness: www.nami.org Contact a health care provider if: You stop taking your antidepressant medicines, and you have any of these symptoms: Nausea. Headache. Light-headedness. Chills and body aches. Not being able to sleep (insomnia). You or your friends and family think your depression is getting worse. Get help right away if: You have thoughts of hurting yourself or others. If you ever feel like you may hurt yourself or others, or have thoughts about taking your own life, get help right away. Go to your nearest emergency department or: Call your local emergency services (911 in the [place] Call a suicide crisis helpline, such as the National Suicide Prevention Lifeline at 682-285-9136 or 988 in the [place] This is open 24 hours a day in the  [place] Text the Crisis Text Line at (850)348-7701 (in the [place] Summary If you are diagnosed with depression, preparing yourself to manage your symptoms is a good way to feel positive about your future. Work with your health care provider on a management plan that includes stress reduction techniques, medicines (if applicable), therapy, and healthy lifestyle habits. Keep talking with your health care provider about how your treatment is working. If you have thoughts about taking your own life, call a suicide crisis helpline or text a crisis text line. This information is not intended to replace advice given to you by your health care provider. Make sure you discuss any questions you have with your health care provider. Document Revised: 02/04/2021 Document Reviewed: 05/23/2019 Elsevier Patient Education  2022 [employer].

## 2021-06-30 LAB — TSH: TSH: 3.85 u[IU]/mL (ref 0.35–5.50)

## 2021-06-30 LAB — COMPREHENSIVE METABOLIC PANEL
ALT: 31 U/L (ref 0–35)
AST: 25 U/L (ref 0–37)
Albumin: 4.8 g/dL (ref 3.5–5.2)
Alkaline Phosphatase: 24 U/L — ABNORMAL LOW (ref 39–117)
BUN: 9 mg/dL (ref 6–23)
CO2: 23 mEq/L (ref 19–32)
Calcium: 10 mg/dL (ref 8.4–10.5)
Chloride: 99 mEq/L (ref 96–112)
Creatinine, Ser: 0.6 mg/dL (ref 0.40–1.20)
GFR: 104.15 mL/min (ref >60.00)
Glucose, Bld: 88 mg/dL (ref 70–99)
Potassium: 4.2 mEq/L (ref 3.5–5.1)
Sodium: 133 mEq/L — ABNORMAL LOW (ref 135–145)
Total Bilirubin: 0.5 mg/dL (ref 0.2–1.2)
Total Protein: 7.7 g/dL (ref 6.0–8.3)

## 2021-06-30 LAB — LIPID PANEL
Cholesterol: 211 mg/dL — ABNORMAL HIGH (ref 0–200)
HDL: 39.4 mg/dL (ref >39.00)
NonHDL: 171.54
Total CHOL/HDL Ratio: 5
Triglycerides: 335 mg/dL — ABNORMAL HIGH (ref 0.0–149.0)
VLDL: 67 mg/dL — ABNORMAL HIGH (ref 0.0–40.0)

## 2021-06-30 LAB — LDL CHOLESTEROL, DIRECT: Direct LDL: 135 mg/dL

## 2021-08-26 ENCOUNTER — Ambulatory Visit: Payer: BC Managed Care – PPO | Admitting: Family Medicine

## 2021-09-01 ENCOUNTER — Other Ambulatory Visit: Payer: Self-pay | Admitting: Family Medicine

## 2021-09-10 ENCOUNTER — Other Ambulatory Visit: Payer: Self-pay | Admitting: Internal Medicine

## 2021-09-10 DIAGNOSIS — E1165 Type 2 diabetes mellitus with hyperglycemia: Secondary | ICD-10-CM

## 2021-10-05 ENCOUNTER — Other Ambulatory Visit: Payer: Self-pay | Admitting: Internal Medicine

## 2021-10-05 DIAGNOSIS — Z794 Long term (current) use of insulin: Secondary | ICD-10-CM

## 2021-10-05 DIAGNOSIS — E1165 Type 2 diabetes mellitus with hyperglycemia: Secondary | ICD-10-CM

## 2021-10-06 ENCOUNTER — Ambulatory Visit: Payer: BC Managed Care – PPO | Admitting: Internal Medicine

## 2021-10-28 ENCOUNTER — Other Ambulatory Visit: Payer: Self-pay | Admitting: Internal Medicine

## 2021-10-28 DIAGNOSIS — E1165 Type 2 diabetes mellitus with hyperglycemia: Secondary | ICD-10-CM

## 2021-11-02 ENCOUNTER — Other Ambulatory Visit: Payer: Self-pay | Admitting: Internal Medicine

## 2021-11-02 DIAGNOSIS — E1165 Type 2 diabetes mellitus with hyperglycemia: Secondary | ICD-10-CM

## 2021-11-03 ENCOUNTER — Other Ambulatory Visit: Payer: Self-pay | Admitting: Family Medicine

## 2021-11-04 ENCOUNTER — Encounter: Payer: Self-pay | Admitting: Internal Medicine

## 2021-11-04 ENCOUNTER — Ambulatory Visit (INDEPENDENT_AMBULATORY_CARE_PROVIDER_SITE_OTHER): Payer: BC Managed Care – PPO | Admitting: Internal Medicine

## 2021-11-04 VITALS — BP 120/78 | HR 77 | Ht 67.0 in | Wt 201.2 lb

## 2021-11-04 DIAGNOSIS — E669 Obesity, unspecified: Secondary | ICD-10-CM

## 2021-11-04 DIAGNOSIS — Z794 Long term (current) use of insulin: Secondary | ICD-10-CM

## 2021-11-04 DIAGNOSIS — E781 Pure hyperglyceridemia: Secondary | ICD-10-CM | POA: Diagnosis not present

## 2021-11-04 DIAGNOSIS — E1165 Type 2 diabetes mellitus with hyperglycemia: Secondary | ICD-10-CM | POA: Diagnosis not present

## 2021-11-04 LAB — POCT GLYCOSYLATED HEMOGLOBIN (HGB A1C): Hemoglobin A1C: 6.2 % — AB (ref 4.0–5.6)

## 2021-11-04 MED ORDER — METFORMIN HCL 1000 MG PO TABS: 1000.0000 mg | ORAL_TABLET | Freq: Two times a day (BID) | ORAL | 3 refills | Status: DC

## 2021-11-04 MED ORDER — FENOFIBRATE 134 MG PO CAPS: 134.0000 mg | ORAL_CAPSULE | Freq: Every day | ORAL | 3 refills | Status: DC

## 2021-11-04 NOTE — Progress Notes (Signed)
Patient ID: Carolyn Butler, female   DOB: Jul 18, 1971, 51 y.o.   MRN: [id number] ? ?This visit occurred during the SARS-CoV-2 public health emergency.  Safety protocols were in place, including screening questions prior to the visit, additional usage of staff PPE, and extensive cleaning of exam room while observing appropriate contact time as indicated for disinfecting solutions.  ? ?HPI: ?Carolyn Butler is a 51 y.o.-year-old female, returning for f/u for DM2, dx 2005, insulin-dependent since 01/2013, controlled, without long-term complications. Last visit was 7 months ago. ? ?Interim history: ?No increased urination, blurry vision, chest pain. ?She still has a little nausea in am.  ?She is walking for exercise, but not recently. ?She relaxed her diet since last OV >> gained 9 lbs. ? ?Reviewed HbA1c levels: ?Lab Results  ?Component Value Date  ? HGBA1C 5.5 04/02/2021  ? HGBA1C 5.6 09/25/2020  ? HGBA1C 5.6 03/27/2020  ?Previous A1c was 8.4% in 06/2012.  ?She has a h/o med noncompliance per PCPs notes.   ? ?She is on: ?- Metformin 1000 mg 2x a day >> 1000 mg in am and [phone] mg at night ?-  >> Levemir 30 >> 32-35 >> 25 >> 30 units at night ?- Ozempic 0.5 mg weekly -added 05/2018 >> 1 mg weekly >> Nausea >> 0.5 >> 1 mg weekly ?She tried Toujeo in the past but developed headaches from it. ?Pt was on Amaryl in the past and gained 15 lbs >> stopped it and lost the weight ?We stopped Januvia 100 mg in 01/2013 b/c increased TG and high risk of Pancreatitis.  ?She was also previously on NovoLog, but was able to come off due to good diabetes control. ? ?She was not checking blood sugars at last visit.  Now checking 0 to once a day: ?- am:  100-120 >> 111-127 >> 104-114 >> 93-109  >> 100-112 ?- 2h after breakfast:206, 240 >> n/c >> 160s >> n/c ?- before lunch: n/c >> 130-162 >> 110-120 >> n/c ?- 2h after lunch: 114, 170 >> 143-150 >> 93-141 >> 150-160 ?- before dinner: 180, 193, 197 >> n/c >> 110-120 >> n/c >> 180-236 ?-  bedtime: 140-190, 227 >> 138-166, 184 >> 102-113, 153 >> n/c ?Lowest sugar was 104 >> 93 >> 90 >> 100; she has hypoglycemia awareness in the 80s. ?Highest sugar was 227 >> 184 >> 153 >> 170 >> 250. ? ?Meter: ReliOn ? ?No CKD, last BUN/creatinine was:  ?Lab Results  ?Component Value Date  ? BUN 9 06/29/2021  ? CREATININE 0.60 06/29/2021  ?Prev. On losartan (but off at last visit for 1.5 mo >> developed palpitations and BP 187/?, P 103).  We restarted Cozaar 50 mg daily. ? ?+ HL; last set of lipids: ?Lab Results  ?Component Value Date  ? CHOL 211 (H) 06/29/2021  ? HDL 39.40 06/29/2021  ? LDLCALC 128 (H) 10/22/2020  ? LDLDIRECT 135.0 06/29/2021  ? TRIG 335.0 (H) 06/29/2021  ? CHOLHDL 5 06/29/2021  ? ?Prev: ?Lab Results  ?Component Value Date  ? CHOL 170 05/09/2013  ? HDL 28* 05/09/2013  ? [place] Comment:   Not calculated due to Triglyceride >400.  05/09/2013  ? LDLDIRECT 86.8 02/16/2013  ? TRIG 790 8 05/09/2013  ? CHOLHDL 6.1 05/09/2013  ?She was on Lipitor 10 mg - 3x a week 2/2 mm aches, gemfibrozil 600 mg 2x a day, omega-3 fatty acids 1000 mg 2x a day >> muscle aches.  At last visit, I suggested switching from gemfibrozil to  Fenofibrate  134.  She is currently on fenofibrate and mentions that her muscle aches have resolved.  However, she also stopped taking Lipitor because of possible muscle aches with it.  At last visit I suggested Crestor 5 mg weekly. ? ?Pt's last eye exam was in 2021: No DR ? ?No numbness and tingling in her legs.  ? ?She has history of HTN. ?She has a history of bruxism. ? ?ROS: ?+ see HPI ?+ Mild nausea ? ?I reviewed pt's medications, allergies, PMH, social hx, family hx, and changes were documented in the history of present illness. Otherwise, unchanged from my initial visit note. ? ?Past Medical History:  ?Diagnosis Date  ? Allergy   ? Anemia   ? Anxiety   ? Arthritis   ? Depression   ? Diabetes mellitus without complication ([place])   ? GERD (gastroesophageal reflux disease)   ? Hyperlipidemia    ? Hypertension   ? Neuromuscular disorder ([place])   ? told has Fibro after injury  ? PONV (postoperative nausea and vomiting)   ? ?Past Surgical History:  ?Procedure Laterality Date  ? COLONOSCOPY  05/05/2021  ? DILATION AND CURETTAGE OF UTERUS    ? INDUCED ABORTION    ? MOUTH SURGERY  2022  ? 2 caps removed  ? surgery on right knee    ? TOOTH EXTRACTION  10/2020  ? ?Social History  ? ?Socioeconomic History  ? Marital status: Married  ?  Spouse name: Garcelle Derita  ? Number of children: 0  ? Years of education: 12+  ? Highest education level: Not on file  ?Occupational History  ? Occupation: unemployed  ?  Comment: banker  ?Tobacco Use  ? Smoking status: Never  ? Smokeless tobacco: Never  ?Vaping Use  ? Vaping Use: Some days  ? Devices: CBD oil  ?Substance and Sexual Activity  ? Alcohol use: Yes  ?  Comment: twice a year mixed drink or red wine  occ  ? Drug use: Yes  ?  Types: Marijuana  ?  Comment: occasionally  ? Sexual activity: Yes  ?Other Topics Concern  ? Not on file  ?Social History Narrative  ? Lives with her husband.  ? Laid-off from job at [employer] (2014), and hasn't returned to work.  ? ?Social Determinants of Health  ? ?Financial Resource Strain: Not on file  ?Food Insecurity: Not on file  ?Transportation Needs: Not on file  ?Physical Activity: Not on file  ?Stress: Not on file  ?Social Connections: Not on file  ?Intimate Partner Violence: Not on file  ? ?Current Outpatient Medications on File Prior to Visit  ?Medication Sig Dispense Refill  ? BD PEN NEEDLE NANO 2ND GEN 32G X 4 MM MISC USE 1 PEN NEEDLE 4 TIMES DAILY 100 each 0  ? cholecalciferol (VITAMIN D3) 25 MCG (1000 UNIT) tablet Take 1,000 Units by mouth daily.    ? cyclobenzaprine (FLEXERIL) 5 MG tablet Take 1-2 tablets (5-10 mg total) by mouth 3 (three) times daily as needed for muscle spasms. 15 tablet 1  ? ferrous sulfate 325 (65 FE) MG tablet Take 325 mg by mouth daily with breakfast.    ? FLUoxetine (PROZAC) 10 MG capsule Take 1 capsule  (10 mg total) by mouth daily. 90 capsule 1  ? insulin detemir (LEVEMIR FLEXPEN) 100 UNIT/ML FlexPen INJECT 30 UNITS SUBCUTANEOUSLY AT NIGHT AT BEDTIME 9 mL 0  ? Lancets (ONETOUCH ULTRASOFT) lancets Use as instructed. Test blood glucose bid. E11.9 100 each 12  ? losartan (  COZAAR) 50 MG tablet Take 1 tablet (50 mg total) by mouth daily. 90 tablet 1  ? metFORMIN (GLUCOPHAGE) 1000 MG tablet Take 1 tablet by mouth twice daily 180 tablet 0  ? Multiple Vitamin (MULTIVITAMIN) tablet Take 1 tablet by mouth daily.    ? Omega 3 1000 MG CAPS Take by mouth daily.    ? OZEMPIC, 1 MG/DOSE, 4 MG/3ML SOPN INJECT 1 ML SUBCUTANEOUSLY ONCE A WEEK 9 mL 3  ? QUEtiapine (SEROQUEL) 25 MG tablet Take 0.5-1 tablets (12.5-25 mg total) by mouth at bedtime. 30 tablet 1  ? rosuvastatin (CRESTOR) 5 MG tablet Take 1 tablet (5 mg total) by mouth daily. 15 tablet 3  ? VITAMIN E PO Take by mouth daily.    ? ?No current facility-administered medications on file prior to visit.  ? ?Allergies  ?Allergen Reactions  ? Doxycycline Hives  ? ?Family History  ?Problem Relation Age of Onset  ? Diabetes Father   ? Heart disease Father   ? Hyperlipidemia Father   ? Diabetes Brother   ? Colon cancer Paternal Uncle   ? Cancer - Colon Paternal Uncle 3  ? Diabetes Paternal Grandmother   ? Heart disease Paternal Grandmother   ? Colon polyps Neg Hx   ? Esophageal cancer Neg Hx   ? Rectal cancer Neg Hx   ? Stomach cancer Neg Hx   ? ?PE: ? BP 120/78 (BP Location: Left Arm, Patient Position: Sitting, Cuff Size: Normal)   Pulse 77   Ht 5\' 7"  (1.702 m)   Wt 201 lb 3.2 oz (91.3 kg)   SpO2 98%   BMI 31.51 kg/m?  ?Wt Readings from Last 3 Encounters:  ?11/04/21 201 lb 3.2 oz (91.3 kg)  ?06/29/21 196 lb (88.9 kg)  ?05/05/21 190 lb (86.2 kg)  ? ?Constitutional: overweight, in NAD ?Eyes: PERRLA, EOMI, no exophthalmos ?ENT: moist mucous membranes, no thyromegaly, no cervical lymphadenopathy ?Cardiovascular: RRR, No MRG ?Respiratory: CTA B ?Musculoskeletal: no deformities,  strength intact in all 4 ?Skin: moist, warm, no rashes ?Neurological: no tremor with outstretched hands, DTR normal in all 4 ?Diabetic Foot Exam - Simple   ?Simple Foot Form ?Diabetic Foot exam was performed with t

## 2021-11-04 NOTE — Patient Instructions (Addendum)
Please continue: ?- Metformin 1000 mg in am and [phone] mg at night. ?- Lantus 30 units at night ?- Ozempic 1 mg weekly ? ?Please take: ?- Crestor 5 mg weekly ?- Fenofibrate 145 mg daily ? ?Please return in 4-6 months with your sugar log.  ?

## 2021-12-02 ENCOUNTER — Other Ambulatory Visit: Payer: Self-pay | Admitting: Internal Medicine

## 2021-12-02 DIAGNOSIS — E1165 Type 2 diabetes mellitus with hyperglycemia: Secondary | ICD-10-CM

## 2022-01-06 ENCOUNTER — Other Ambulatory Visit: Payer: Self-pay | Admitting: Internal Medicine

## 2022-01-06 DIAGNOSIS — E1165 Type 2 diabetes mellitus with hyperglycemia: Secondary | ICD-10-CM

## 2022-01-28 ENCOUNTER — Other Ambulatory Visit: Payer: Self-pay | Admitting: Internal Medicine

## 2022-01-28 ENCOUNTER — Other Ambulatory Visit: Payer: Self-pay | Admitting: Family Medicine

## 2022-01-28 DIAGNOSIS — E1165 Type 2 diabetes mellitus with hyperglycemia: Secondary | ICD-10-CM

## 2022-02-18 ENCOUNTER — Other Ambulatory Visit: Payer: Self-pay | Admitting: Family Medicine

## 2022-02-18 DIAGNOSIS — I10 Essential (primary) hypertension: Secondary | ICD-10-CM

## 2022-02-20 ENCOUNTER — Other Ambulatory Visit: Payer: Self-pay | Admitting: Family Medicine

## 2022-02-24 ENCOUNTER — Other Ambulatory Visit: Payer: Self-pay | Admitting: Internal Medicine

## 2022-02-24 DIAGNOSIS — E1165 Type 2 diabetes mellitus with hyperglycemia: Secondary | ICD-10-CM

## 2022-03-24 ENCOUNTER — Other Ambulatory Visit: Payer: Self-pay | Admitting: Internal Medicine

## 2022-03-25 ENCOUNTER — Other Ambulatory Visit: Payer: Self-pay | Admitting: Internal Medicine

## 2022-03-25 DIAGNOSIS — E1165 Type 2 diabetes mellitus with hyperglycemia: Secondary | ICD-10-CM

## 2022-04-08 ENCOUNTER — Ambulatory Visit (INDEPENDENT_AMBULATORY_CARE_PROVIDER_SITE_OTHER): Payer: BC Managed Care – PPO | Admitting: Family Medicine

## 2022-04-08 ENCOUNTER — Encounter: Payer: Self-pay | Admitting: Family Medicine

## 2022-04-08 VITALS — BP 122/82 | HR 78 | Temp 97.9°F | Ht 67.0 in | Wt 200.6 lb

## 2022-04-08 DIAGNOSIS — M25512 Pain in left shoulder: Secondary | ICD-10-CM

## 2022-04-08 NOTE — Patient Instructions (Signed)
Please have x-ray performed at the imaging facility below at your convenience.  You do not need an appointment.  I will refer you to orthopedic specialist, let me know if you do not hear from them in the next week or 2.  I am suspicious for a rotator cuff injury or at the minimum some tendinitis in the rotator cuff, but that can be discussed further at orthopedics to decide on next step, possibly injection or other imaging.  Try to avoid activities that cause pain in that arm for now, range of motion is fine.  Topical diclofenac is fine for now as well.  Hang in there.  [facility] in 8:30-4:30 during weekdays, no appointment needed [address]  [place], [place] [address]   Shoulder Pain Many things can cause shoulder pain, including: An injury to the shoulder. Overuse of the shoulder. Arthritis. The source of the pain can be: Inflammation. An injury to the shoulder joint. An injury to a tendon, ligament, or bone. Follow these instructions at home: Pay attention to changes in your symptoms. Let your health care provider know about them. Follow these instructions to relieve your pain. If you have a sling: Wear the sling as told by your health care provider. Remove it only as told by your health care provider. Loosen the sling if your fingers tingle, become numb, or turn cold and blue. Keep the sling clean. If the sling is not waterproof: Do not let it get wet. Remove it to shower or bathe. Move your arm as little as possible, but keep your hand moving to prevent swelling. Managing pain, stiffness, and swelling  If directed, put ice on the painful area: Put ice in a plastic bag. Place a towel between your skin and the bag. Leave the ice on for 20 minutes, 2-3 times per day. Stop applying ice if it does not help with the pain. Squeeze a soft ball or a foam pad as much as possible. This helps to keep the shoulder from swelling. It also helps to strengthen the arm. General  instructions Take over-the-counter and prescription medicines only as told by your health care provider. Keep all follow-up visits as told by your health care provider. This is important. Contact a health care provider if: Your pain gets worse. Your pain is not relieved with medicines. New pain develops in your arm, hand, or fingers. Get help right away if: Your arm, hand, or fingers: Tingle. Become numb. Become swollen. Become painful. Turn white or blue. Summary Shoulder pain can be caused by an injury, overuse, or arthritis. Pay attention to changes in your symptoms. Let your health care provider know about them. This condition may be treated with a sling, ice, and pain medicines. Contact your health care provider if the pain gets worse or new pain develops. Get help right away if your arm, hand, or fingers tingle or become numb, swollen, or painful. Keep all follow-up visits as told by your health care provider. This is important. This information is not intended to replace advice given to you by your health care provider. Make sure you discuss any questions you have with your health care provider. Document Revised: 03/27/2021 Document Reviewed: 03/27/2021 Elsevier Patient Education  2023 [employer].

## 2022-04-08 NOTE — Progress Notes (Signed)
Subjective:  Patient ID: Carolyn Butler, female    DOB: 1970-10-26  Age: 51 y.o. MRN: [id number]  CC:  Chief Complaint  Patient presents with   Shoulder Pain    Pt states she has left shoulder pain, she states she was pulling herself up about 2 months ago, pt states she cant put weight on it, pt has tried ice and heat and biofreeze and voltaren gel, relief is short    Depression    PHQ9 - 9    HPI Carolyn Butler presents for   L shoulder pain:  Noted after golfing about a year ago. ?felt sharp stabbing pain. Unable to golf since. No medical eval. Minimal improvement, but never completely resolved.  2 months ago -spring cleaning-, reaching up and pulling self upward, felt pop and pain in left shoulder. No swelling, but some bruising noted on back pf shoulder.  Attempted treatments of ice, heat, Biofreeze, Voltaren gel - min relief.  R hand dominant but uses left frequently.  Trying to keep moving, limited in yoga poses, pain with arm elevation. Pain in upper arm, burning pain at times, some soreness in neck at times. No hand weakness.    History Patient Active Problem List   Diagnosis Date Noted   Tension headache 07/06/2018   Muscle spasm 07/06/2018   Controlled diabetes mellitus with long-term current use of insulin (HCC) 08/14/2015   Pseudohyponatremia 12/14/2012   Hypertriglyceridemia 12/14/2012   Benign essential HTN 11/20/2011   Obesity, Class II, BMI 35-39.9 11/20/2011   Sleep disturbance 11/20/2011   Past Medical History:  Diagnosis Date   Allergy    Anemia    Anxiety    Arthritis    Depression    Diabetes mellitus without complication (HCC)    GERD (gastroesophageal reflux disease)    Hyperlipidemia    Hypertension    Neuromuscular disorder (HCC)    told has Fibro after injury   PONV (postoperative nausea and vomiting)    Past Surgical History:  Procedure Laterality Date   COLONOSCOPY  05/05/2021   DILATION AND CURETTAGE OF UTERUS     INDUCED  ABORTION     MOUTH SURGERY  2022   2 caps removed   surgery on right knee     TOOTH EXTRACTION  10/2020   Allergies  Allergen Reactions   Doxycycline Hives   Prior to Admission medications   Medication Sig Start Date End Date Taking? Authorizing Provider  BD PEN NEEDLE NANO 2ND GEN 32G X 4 MM MISC USE 1 PEN NEEDLE 4 TIMES DAILY 02/22/22  Yes Shade Flood, MD  cholecalciferol (VITAMIN D3) 25 MCG (1000 UNIT) tablet Take 1,000 Units by mouth daily.   Yes [provider]  cyclobenzaprine (FLEXERIL) 5 MG tablet Take 1-2 tablets (5-10 mg total) by mouth 3 (three) times daily as needed for muscle spasms. 06/29/21  Yes Shade Flood, MD  fenofibrate micronized (LOFIBRA) 134 MG capsule Take 1 capsule (134 mg total) by mouth daily before breakfast. 11/04/21 10/30/22 Yes Carlus Pavlov, MD  ferrous sulfate 325 (65 FE) MG tablet Take 325 mg by mouth daily with breakfast.   Yes [provider]  Lancets (ONETOUCH ULTRASOFT) lancets Use as instructed. Test blood glucose bid. E11.9 11/09/18  Yes Doristine Bosworth, MD  LEVEMIR FLEXPEN 100 UNIT/ML FlexPen INJECT 30 UNITS SUBCUTANEOUSLY AT NIGHT AT BEDTIME 03/25/22  Yes Carlus Pavlov, MD  losartan (COZAAR) 50 MG tablet Take 1 tablet by mouth once daily 02/18/22  Yes  Shade Flood, MD  metFORMIN (GLUCOPHAGE) 1000 MG tablet Take 1 tablet (1,000 mg total) by mouth 2 (two) times daily. 11/04/21  Yes Carlus Pavlov, MD  Multiple Vitamin (MULTIVITAMIN) tablet Take 1 tablet by mouth daily.   Yes [provider]  Omega 3 1000 MG CAPS Take by mouth daily.   Yes [provider]  OZEMPIC, 1 MG/DOSE, 4 MG/3ML SOPN INJECT 1 ML SUBCUTANEOUSLY  ONCE A WEEK 03/24/22  Yes Carlus Pavlov, MD  rosuvastatin (CRESTOR) 5 MG tablet Take 1 tablet (5 mg total) by mouth daily. 06/29/21  Yes Shade Flood, MD  VITAMIN E PO Take by mouth daily.   Yes [provider]  FLUoxetine (PROZAC) 10 MG capsule Take 1 capsule (10 mg  total) by mouth daily. Patient not taking: Reported on 04/08/2022 06/29/21   Shade Flood, MD  QUEtiapine (SEROQUEL) 25 MG tablet Take 0.5-1 tablets (12.5-25 mg total) by mouth at bedtime. Patient not taking: Reported on 04/08/2022 06/29/21   Shade Flood, MD   Social History   Socioeconomic History   Marital status: Married    Spouse name: Paisleigh Maroney   Number of children: 0   Years of education: 12+   Highest education level: Not on file  Occupational History   Occupation: unemployed    Comment: banker  Tobacco Use   Smoking status: Never   Smokeless tobacco: Never  Vaping Use   Vaping Use: Some days   Devices: CBD oil  Substance and Sexual Activity   Alcohol use: Yes    Comment: twice a year mixed drink or red wine  occ   Drug use: Yes    Types: Marijuana    Comment: occasionally   Sexual activity: Yes  Other Topics Concern   Not on file  Social History Narrative   Lives with her husband.   Laid-off from job at [employer] (2014), and hasn't returned to work.   Social Determinants of Health   Financial Resource Strain: Not on file  Food Insecurity: Not on file  Transportation Needs: Not on file  Physical Activity: Not on file  Stress: Not on file  Social Connections: Not on file  Intimate Partner Violence: Not on file    Review of Systems Per hpi;   Objective:   Vitals:   04/08/22 1147  BP: 122/82  Pulse: 78  Temp: 97.9 F (36.6 C)  SpO2: 98%  Weight: 200 lb 9.6 oz (91 kg)  Height: 5\' 7"  (1.702 m)     Physical Exam Constitutional:      General: She is not in acute distress.    Appearance: Normal appearance. She is well-developed.  HENT:     Head: Normocephalic and atraumatic.  Cardiovascular:     Rate and Rhythm: Normal rate.  Pulmonary:     Effort: Pulmonary effort is normal.  Musculoskeletal:     Comments: C-spine, no midline bony tenderness, pain-free range of motion and does not reproduce shoulder or arm symptoms.  Left  clavicle, AC, Chapin nontender. Minimal discomfort over the anterior, posterior lateral shoulder.  Skin intact without ecchymosis or erythema. Active range of motion intact with slight decreased internal rotation by approximately 2-3 vertebral levels compared to right side.  Discomfort at terminal abduction, flexion.  Pain with empty can testing but not weak, pain with liftoff but not weak.  Negative drop arm testing.  Other rotator cuff testing pain-free and full.  Negative Hawkins, Neer.  Neurological:     Mental Status:  She is alert and oriented to person, place, and time.  Psychiatric:        Mood and Affect: Mood normal.        Assessment & Plan:  Carolyn Butler is a 51 y.o. female . Left shoulder pain, unspecified chronicity - Plan: DG Shoulder Left, Ambulatory referral to Orthopedic Surgery  -Suspicious for rotator cuff tear versus tendinosis.  Check imaging given reported pop and persistent pain, but advanced imaging can be decided by Ortho versus injection versus PT.  Referral placed.  Continue symptomatic care with topical diclofenac, activity modification, RTC precautions.  Initial description of symptoms with sharp, radiating pain concerning for possible cervical source but reassuring cervical exam at present.  Unable to elicit pain or symptoms with cervical exam.  Hold on cervical imaging at this time.  No orders of the defined types were placed in this encounter.  Patient Instructions  Please have x-ray performed at the imaging facility below at your convenience.  You do not need an appointment.  I will refer you to orthopedic specialist, let me know if you do not hear from them in the next week or 2.  I am suspicious for a rotator cuff injury or at the minimum some tendinitis in the rotator cuff, but that can be discussed further at orthopedics to decide on next step, possibly injection or other imaging.  Try to avoid activities that cause pain in that arm for now, range of motion  is fine.  Topical diclofenac is fine for now as well.  Hang in there.  [facility] 8:30-4:30 during weekdays, no appointment needed [address]  [place], [place] [address]   Shoulder Pain Many things can cause shoulder pain, including: An injury to the shoulder. Overuse of the shoulder. Arthritis. The source of the pain can be: Inflammation. An injury to the shoulder joint. An injury to a tendon, ligament, or bone. Follow these instructions at home: Pay attention to changes in your symptoms. Let your health care provider know about them. Follow these instructions to relieve your pain. If you have a sling: Wear the sling as told by your health care provider. Remove it only as told by your health care provider. Loosen the sling if your fingers tingle, become numb, or turn cold and blue. Keep the sling clean. If the sling is not waterproof: Do not let it get wet. Remove it to shower or bathe. Move your arm as little as possible, but keep your hand moving to prevent swelling. Managing pain, stiffness, and swelling  If directed, put ice on the painful area: Put ice in a plastic bag. Place a towel between your skin and the bag. Leave the ice on for 20 minutes, 2-3 times per day. Stop applying ice if it does not help with the pain. Squeeze a soft ball or a foam pad as much as possible. This helps to keep the shoulder from swelling. It also helps to strengthen the arm. General instructions Take over-the-counter and prescription medicines only as told by your health care provider. Keep all follow-up visits as told by your health care provider. This is important. Contact a health care provider if: Your pain gets worse. Your pain is not relieved with medicines. New pain develops in your arm, hand, or fingers. Get help right away if: Your arm, hand, or fingers: Tingle. Become numb. Become swollen. Become painful. Turn white or blue. Summary Shoulder pain can be caused by an  injury, overuse, or arthritis.  Pay attention to changes in your symptoms. Let your health care provider know about them. This condition may be treated with a sling, ice, and pain medicines. Contact your health care provider if the pain gets worse or new pain develops. Get help right away if your arm, hand, or fingers tingle or become numb, swollen, or painful. Keep all follow-up visits as told by your health care provider. This is important. This information is not intended to replace advice given to you by your health care provider. Make sure you discuss any questions you have with your health care provider. Document Revised: 03/27/2021 Document Reviewed: 03/27/2021 Elsevier Patient Education  2023 Elsevier Inc.     Signed,   Meredith Staggers, MD [facility], [facility] 04/08/22 12:52 PM

## 2022-05-03 ENCOUNTER — Ambulatory Visit (INDEPENDENT_AMBULATORY_CARE_PROVIDER_SITE_OTHER): Payer: BC Managed Care – PPO | Admitting: Internal Medicine

## 2022-05-03 ENCOUNTER — Encounter: Payer: Self-pay | Admitting: Internal Medicine

## 2022-05-03 VITALS — BP 128/74 | HR 79 | Ht 67.0 in | Wt 203.2 lb

## 2022-05-03 DIAGNOSIS — Z794 Long term (current) use of insulin: Secondary | ICD-10-CM

## 2022-05-03 DIAGNOSIS — E781 Pure hyperglyceridemia: Secondary | ICD-10-CM

## 2022-05-03 DIAGNOSIS — E669 Obesity, unspecified: Secondary | ICD-10-CM

## 2022-05-03 DIAGNOSIS — E1165 Type 2 diabetes mellitus with hyperglycemia: Secondary | ICD-10-CM

## 2022-05-03 LAB — COMPREHENSIVE METABOLIC PANEL
ALT: 24 U/L (ref 0–35)
AST: 17 U/L (ref 0–37)
Albumin: 4.7 g/dL (ref 3.5–5.2)
Alkaline Phosphatase: 26 U/L — ABNORMAL LOW (ref 39–117)
BUN: 9 mg/dL (ref 6–23)
CO2: 24 mEq/L (ref 19–32)
Calcium: 9.5 mg/dL (ref 8.4–10.5)
Chloride: 98 mEq/L (ref 96–112)
Creatinine, Ser: 0.55 mg/dL (ref 0.40–1.20)
GFR: 105.74 mL/min (ref >60.00)
Glucose, Bld: 110 mg/dL — ABNORMAL HIGH (ref 70–99)
Potassium: 3.8 mEq/L (ref 3.5–5.1)
Sodium: 134 mEq/L — ABNORMAL LOW (ref 135–145)
Total Bilirubin: 0.5 mg/dL (ref 0.2–1.2)
Total Protein: 7.9 g/dL (ref 6.0–8.3)

## 2022-05-03 LAB — POCT GLYCOSYLATED HEMOGLOBIN (HGB A1C): Hemoglobin A1C: 6.3 % — AB (ref 4.0–5.6)

## 2022-05-03 LAB — LIPID PANEL
Cholesterol: 199 mg/dL (ref 0–200)
HDL: 33.8 mg/dL — ABNORMAL LOW (ref >39.00)
NonHDL: 165.23
Total CHOL/HDL Ratio: 6
Triglycerides: 313 mg/dL — ABNORMAL HIGH (ref 0.0–149.0)
VLDL: 62.6 mg/dL — ABNORMAL HIGH (ref 0.0–40.0)

## 2022-05-03 LAB — LDL CHOLESTEROL, DIRECT: Direct LDL: 134 mg/dL

## 2022-05-03 LAB — MICROALBUMIN / CREATININE URINE RATIO
Creatinine,U: 156.7 mg/dL
Microalb Creat Ratio: 1.1 mg/g (ref 0.0–30.0)
Microalb, Ur: 1.7 mg/dL (ref 0.0–1.9)

## 2022-05-03 MED ORDER — FREESTYLE LIBRE 3 SENSOR MISC: 1.0000 | 3 refills | Status: DC

## 2022-05-03 NOTE — Patient Instructions (Addendum)
Please continue: - Metformin 1000 mg in am and [phone] mg at night. - Lantus 30 units at night - Ozempic 1 mg weekly - try Mylanta   Please take: - Fenofibrate 145 mg daily  Please return in 6 months with your sugar log.

## 2022-05-03 NOTE — Progress Notes (Unsigned)
Patient ID: Carolyn Butler, female   DOB: [birth year], 51 y.o.   MRN: [id number]  HPI: Carolyn Butler is a 51 y.o.-year-old female, returning for f/u for DM2, dx 2005, insulin-dependent since 01/2013, controlled, without long-term complications. Last visit was 6 months ago.  Interim history: No increased urination, blurry vision, chest pain. She has L shoulder pain. She still has nausea and diarrhea. She restarted walking for exercise: ~10,000 steps a day.  She did not lose weight, but she does admit for dietary reasons.  Reviewed HbA1c levels: Lab Results  Component Value Date   HGBA1C 6.2 (A) 11/04/2021   HGBA1C 5.5 04/02/2021   HGBA1C 5.6 09/25/2020  Previous A1c was 8.4% in 06/2012.  She has a h/o med noncompliance per PCPs notes.    She is on: - Metformin 1000 mg 2x a day >> 1000 mg in am and [phone] mg at night -  >> Levemir 30 >> 32-35 >> 25 >> 30 units at night - Ozempic 0.5 mg weekly -added 05/2018 >> 1 mg weekly >> Nausea >> 0.5 >> 1 mg weekly She tried Toujeo in the past but developed headaches from it. Pt was on Amaryl in the past and gained 15 lbs >> stopped it and lost the weight We stopped Januvia 100 mg in 01/2013 b/c increased TG and high risk of Pancreatitis.  She was also previously on NovoLog, but was able to come off due to good diabetes control.  She was not checking blood sugars at last visit.  Now checking 0 to once a day: - am:  111-127 >> 104-114 >> 93-109  >> 100-112 >> 112-118 - 2h after breakfast:206, 240 >> n/c >> 160s >> n/c - before lunch: n/c >> 130-162 >> 110-120 >> n/c - 2h after lunch:143-150 >> 93-141 >> 150-160 >> 140-150 - before dinner: n/c >> 110-120 >> n/c >> 180-236 >> n/c  - bedtime: 1138-166, 184 >> 102-113, 153 >> n/c >> 150 Lowest sugar was 90 >> 100; she has hypoglycemia awareness in the 80s. Highest sugar was 170 >> 250  >> <200.  Meter: ReliOn  No CKD, last BUN/creatinine was:  Lab Results  Component Value Date   BUN 9  06/29/2021   CREATININE 0.60 06/29/2021  Prev. On losartan (but off at last visit for 1.5 mo >> developed palpitations and BP 187/?, P 103).  We restarted Cozaar 50 mg daily.  + HL; last set of lipids: Lab Results  Component Value Date   CHOL 211 (H) 06/29/2021   HDL 39.40 06/29/2021   LDLCALC 128 (H) 10/22/2020   LDLDIRECT 135.0 06/29/2021   TRIG 335.0 (H) 06/29/2021   CHOLHDL 5 06/29/2021   Prev: Lab Results  Component Value Date   CHOL 170 05/09/2013   HDL 28* 05/09/2013   LDLCALC Comment:   Not calculated due to Triglyceride >400.  05/09/2013   LDLDIRECT 86.8 02/16/2013   TRIG 790 8 05/09/2013   CHOLHDL 6.1 05/09/2013  She was on Lipitor 10 mg - 3x a week 2/2 mm aches, gemfibrozil 600 mg 2x a day, omega-3 fatty acids 1000 mg 2x a day >> muscle aches.  At last visit, I suggested switching from gemfibrozil to  Fenofibrate 134.  After changing to fenofibrate, her muscle aches have resolved.  However, she also stopped taking Lipitor because of possible muscle aches with it.  I suggested Crestor 5 mg weekly.  However, at last visit, she was off Crestor and fenofibrate.  I suggested to restart.  She  is currently only on Fenofibrate, she stopped Crestor when she developed shoulder pain.  Pt's last eye exam was in 2021: No DR  No numbness and tingling in her legs.  Last foot exam 10/2021.  She has history of HTN. She has a history of bruxism.  ROS: + see HPI + Mild nausea  I reviewed pt's medications, allergies, PMH, social hx, family hx, and changes were documented in the history of present illness. Otherwise, unchanged from my initial visit note.  Past Medical History:  Diagnosis Date   Allergy    Anemia    Anxiety    Arthritis    Depression    Diabetes mellitus without complication (HCC)    GERD (gastroesophageal reflux disease)    Hyperlipidemia    Hypertension    Neuromuscular disorder ([place])    told has Fibro after injury   PONV (postoperative nausea and vomiting)     Past Surgical History:  Procedure Laterality Date   COLONOSCOPY  05/05/2021   DILATION AND CURETTAGE OF UTERUS     INDUCED ABORTION     MOUTH SURGERY  2022   2 caps removed   surgery on right knee     TOOTH EXTRACTION  10/2020   Social History   Socioeconomic History   Marital status: Married    Spouse name: Maryjean Corpening   Number of children: 0   Years of education: 12+   Highest education level: Not on file  Occupational History   Occupation: unemployed    Comment: Customer service manager  Tobacco Use   Smoking status: Never   Smokeless tobacco: Never  Vaping Use   Vaping Use: Some days   Devices: CBD oil  Substance and Sexual Activity   Alcohol use: Yes    Comment: twice a year mixed drink or red wine  occ   Drug use: Yes    Types: Marijuana    Comment: occasionally   Sexual activity: Yes  Other Topics Concern   Not on file  Social History Narrative   Lives with her husband.   Laid-off from job at [employer] (2014), and hasn't returned to work.   Social Determinants of Health   Financial Resource Strain: Not on file  Food Insecurity: Not on file  Transportation Needs: Not on file  Physical Activity: Not on file  Stress: Not on file  Social Connections: Not on file  Intimate Partner Violence: Not on file   Current Outpatient Medications on File Prior to Visit  Medication Sig Dispense Refill   BD PEN NEEDLE NANO 2ND GEN 32G X 4 MM MISC USE 1 PEN NEEDLE 4 TIMES DAILY 100 each 0   cholecalciferol (VITAMIN D3) 25 MCG (1000 UNIT) tablet Take 1,000 Units by mouth daily.     cyclobenzaprine (FLEXERIL) 5 MG tablet Take 1-2 tablets (5-10 mg total) by mouth 3 (three) times daily as needed for muscle spasms. 15 tablet 1   fenofibrate micronized (LOFIBRA) 134 MG capsule Take 1 capsule (134 mg total) by mouth daily before breakfast. 90 capsule 3   ferrous sulfate 325 (65 FE) MG tablet Take 325 mg by mouth daily with breakfast.     FLUoxetine (PROZAC) 10 MG capsule Take 1 capsule  (10 mg total) by mouth daily. (Patient not taking: Reported on 04/08/2022) 90 capsule 1   Lancets (ONETOUCH ULTRASOFT) lancets Use as instructed. Test blood glucose bid. E11.9 100 each 12   LEVEMIR FLEXPEN 100 UNIT/ML FlexPen INJECT 30 UNITS SUBCUTANEOUSLY AT NIGHT AT BEDTIME 9 mL  0   losartan (COZAAR) 50 MG tablet Take 1 tablet by mouth once daily 90 tablet 0   metFORMIN (GLUCOPHAGE) 1000 MG tablet Take 1 tablet (1,000 mg total) by mouth 2 (two) times daily. 180 tablet 3   Multiple Vitamin (MULTIVITAMIN) tablet Take 1 tablet by mouth daily.     Omega 3 1000 MG CAPS Take by mouth daily.     OZEMPIC, 1 MG/DOSE, 4 MG/3ML SOPN INJECT 1 ML SUBCUTANEOUSLY  ONCE A WEEK 9 mL 0   QUEtiapine (SEROQUEL) 25 MG tablet Take 0.5-1 tablets (12.5-25 mg total) by mouth at bedtime. (Patient not taking: Reported on 04/08/2022) 30 tablet 1   rosuvastatin (CRESTOR) 5 MG tablet Take 1 tablet (5 mg total) by mouth daily. 15 tablet 3   VITAMIN E PO Take by mouth daily.     No current facility-administered medications on file prior to visit.   Allergies  Allergen Reactions   Doxycycline Hives   Family History  Problem Relation Age of Onset   Diabetes Father    Heart disease Father    Hyperlipidemia Father    Diabetes Brother    Colon cancer Paternal Uncle    Cancer - Colon Paternal Uncle 31   Diabetes Paternal Grandmother    Heart disease Paternal Grandmother    Colon polyps Neg Hx    Esophageal cancer Neg Hx    Rectal cancer Neg Hx    Stomach cancer Neg Hx    PE:  BP 128/74 (BP Location: Right Arm, Patient Position: Sitting, Cuff Size: Normal)   Pulse 79   Ht 5\' 7"  (1.702 m)   Wt 203 lb 3.2 oz (92.2 kg)   SpO2 96%   BMI 31.83 kg/m  Wt Readings from Last 3 Encounters:  05/03/22 203 lb 3.2 oz (92.2 kg)  04/08/22 200 lb 9.6 oz (91 kg)  11/04/21 201 lb 3.2 oz (91.3 kg)   Constitutional: overweight, in NAD Eyes:  EOMI, no exophthalmos ENT: no neck masses, no cervical lymphadenopathy Cardiovascular:  RRR, No MRG Respiratory: CTA B Musculoskeletal: no deformities Skin:no rashes Neurological: no tremor with outstretched hands  ASSESSMENT: 1. DM2, insulin-dependent, controlled, without long term complications  2. HTG  3.  Obesity class I  PLAN:  1.  Patient with history of good diabetes control on metformin, basal insulin, and weekly GLP-1 receptor agonist.  We had to decrease the dose of Ozempic due to nausea but afterwards she was able to increase the dose back to 1 mg weekly with only mild occasional nausea.  At last visit, HbA1c was at goal, at 6.2%, but slightly higher than before.  At that time, she relaxed diet and gained weight.  She was craving sweets.  She was trying to change her diet and restart walking.  We did not change the regimen at that time. -At today's visit, sugars are at goal at all times of the day.  Describes nausea occasionally with Ozempic, but feels like this is getting better -also feels better when taking it on Saturdays.  She would not want to decrease the dose of Ozempic.  We discussed about trying Mylanta to see if it helps with her nausea.  She agrees to try this.  Otherwise, we can continue the current regimen. - I advised her to:  Patient Instructions  Please continue: - Metformin 1000 mg in am and [phone] mg at night. - Lantus 30 units at night - Ozempic 1 mg weekly - try Mylanta   Please take: - Fenofibrate  145 mg daily  Please return in 6 months with your sugar log.   - we checked her HbA1c: 6.3% (slightly higher) - advised to check sugars at different times of the day - 1x a day, rotating check times - advised for yearly eye exams >> she is UTD - return to clinic in 4-6 months   2. Hypertriglyceridemia -Reviewed the latest lipid panel from 06/2021: LDL high, triglycerides also high at 1 Lab Results  Component Value Date   CHOL 211 (H) 06/29/2021   HDL 39.40 06/29/2021   LDLCALC 128 (H) 10/22/2020   LDLDIRECT 135.0 06/29/2021   TRIG  335.0 (H) 06/29/2021   CHOLHDL 5 06/29/2021  -She was previously on Lipitor 10 mg 3/7 days, Lopid 600 mg twice a day and fish oil 1000 mg twice a day.  She had muscle aches so I suggested to switch from Lopid to fenofibrate and also to try to move Lipitor doses closer together.  She did change to fenofibrate and her muscle aches resolved.  However, she stopped Lipitor.  I suggested to start Crestor 5 mg weekly and to try to move it closer together if well-tolerated.  At last visit, she was off fenofibrate and Crestor completely.  I strongly advised her to restart >> she is now on fenofibrate.  -At last visit I also recommended to the portfolio diet to improve her cholesterol levels - she did not start -Discussed at this visit about improving diet -We will check her lipids today per her request, as she is fasting.  3.  Obesity class I -She continues on Ozempic but we had to decrease the dose due to nausea.  She still had nausea in the morning but would prefer not to change the regimen for now -She gained 9 pounds before last visit - gained 2 lbs since last OV despite starting walking.  We discussed that also changing diet is paramount for wt loss.  Carlus Pavlov, MD PhD [facility] Endocrinology

## 2022-05-06 ENCOUNTER — Ambulatory Visit (INDEPENDENT_AMBULATORY_CARE_PROVIDER_SITE_OTHER): Payer: BC Managed Care – PPO | Admitting: Family Medicine

## 2022-05-06 ENCOUNTER — Encounter: Payer: Self-pay | Admitting: Family Medicine

## 2022-05-06 VITALS — BP 118/76 | HR 75 | Temp 97.9°F | Ht 67.0 in | Wt 204.0 lb

## 2022-05-06 DIAGNOSIS — M62830 Muscle spasm of back: Secondary | ICD-10-CM

## 2022-05-06 DIAGNOSIS — Z13228 Encounter for screening for other metabolic disorders: Secondary | ICD-10-CM

## 2022-05-06 DIAGNOSIS — E1165 Type 2 diabetes mellitus with hyperglycemia: Secondary | ICD-10-CM | POA: Diagnosis not present

## 2022-05-06 DIAGNOSIS — R5383 Other fatigue: Secondary | ICD-10-CM

## 2022-05-06 DIAGNOSIS — E785 Hyperlipidemia, unspecified: Secondary | ICD-10-CM

## 2022-05-06 DIAGNOSIS — Z1159 Encounter for screening for other viral diseases: Secondary | ICD-10-CM

## 2022-05-06 DIAGNOSIS — E559 Vitamin D deficiency, unspecified: Secondary | ICD-10-CM | POA: Diagnosis not present

## 2022-05-06 DIAGNOSIS — I10 Essential (primary) hypertension: Secondary | ICD-10-CM

## 2022-05-06 DIAGNOSIS — Z0000 Encounter for general adult medical examination without abnormal findings: Secondary | ICD-10-CM

## 2022-05-06 DIAGNOSIS — N926 Irregular menstruation, unspecified: Secondary | ICD-10-CM | POA: Diagnosis not present

## 2022-05-06 DIAGNOSIS — Z130 Encounter for screening for diseases of the blood and blood-forming organs and certain disorders involving the immune mechanism: Secondary | ICD-10-CM

## 2022-05-06 DIAGNOSIS — Z794 Long term (current) use of insulin: Secondary | ICD-10-CM

## 2022-05-06 DIAGNOSIS — Z0001 Encounter for general adult medical examination with abnormal findings: Secondary | ICD-10-CM | POA: Diagnosis not present

## 2022-05-06 DIAGNOSIS — Z1329 Encounter for screening for other suspected endocrine disorder: Secondary | ICD-10-CM

## 2022-05-06 LAB — POCT URINE PREGNANCY: Preg Test, Ur: NEGATIVE

## 2022-05-06 MED ORDER — CYCLOBENZAPRINE HCL 5 MG PO TABS: 5.0000 mg | ORAL_TABLET | Freq: Three times a day (TID) | ORAL | 1 refills | Status: AC | PRN

## 2022-05-06 MED ORDER — LOSARTAN POTASSIUM 50 MG PO TABS: 50.0000 mg | ORAL_TABLET | Freq: Every day | ORAL | 3 refills | Status: DC

## 2022-05-06 NOTE — Patient Instructions (Addendum)
CoQ 10 supplement over the counter if staring back on cholesterol med, and try taking once per week. If tolerated can increase to few days per week.  Follow up with therepist as planned and then if needed I am happy to discuss meds for mood again - please schedule an appointment.  Schedule visit with Dr. Idolina Primer and mammogram.  Flexeril temporarily refilled, follow-up if any increase need or worsening back pain I will check some labs, but follow up to discuss results and fatigue.

## 2022-05-06 NOTE — Progress Notes (Signed)
Subjective:  Patient ID: Macario Carls, female    DOB: 05-30-1971  Age: 51 y.o. MRN: [id number]  CC:  Chief Complaint  Patient presents with  . Annual Exam    Pt states she wants different blood work done, pt has a list of what she would like Pt states her crestor she doesn't take because it gives her pain     HPI LYRIQ FLAMAND presents for Annual Exam  Care team, PCP me Endocrinology, Dr.Gherghe, type 2 diabetes GI Dr. Candis Schatz, colonoscopy 05/05/2021 Had fasting labs at endocrine recently. CMP, lipid panel,  Has list of labs requested today.  Vitamin D, TSH, T4, T3, CRP, B12, FSH, iron, ferritin, ANA, CBC, lipid panel, metabolic panel Cousin is homeopathic specialist. Taking b12 supplement and vitamin D 5000iu Qd. Has been low in past.  Concerned about hormones. Still having periods monthly. No period in October, last one in September.  Menses stopped in 40's then restarted.  Some fatigue at times.  Lab Results  Component Value Date   WBC 10.5 (A) 05/01/2019   HGB 11.7 05/01/2019   HCT 34.4 05/01/2019   MCV 83.0 05/01/2019   PLT 490 (H) 01/23/2019    Lab Results  Component Value Date   TSH 3.85 06/29/2021  Last vitamin D Lab Results  Component Value Date   VD25OH 33.4 03/12/2017       Hypertension: Losartan 50 mg daily.  Home readings: none. No new side effects.  BP Readings from Last 3 Encounters:  05/06/22 118/76  05/03/22 128/74  04/08/22 122/82   Lab Results  Component Value Date   CREATININE 0.55 05/03/2022   Hyperlipidemia: Crestor 5 mg daily rx - not taking. Thinks had joint pain on crestor - was taking QOD - last took 1 month ago.  Lab Results  Component Value Date   CHOL 199 05/03/2022   HDL 33.80 (L) 05/03/2022   LDLCALC 128 (H) 10/22/2020   LDLDIRECT 134.0 05/03/2022   TRIG 313.0 (H) 05/03/2022   CHOLHDL 6 05/03/2022   Lab Results  Component Value Date   ALT 24 05/03/2022   AST 17 05/03/2022   ALKPHOS 26 (L) 05/03/2022    BILITOT 0.5 05/03/2022    Depression with anxiety See prior visits, we have prescribed Lexapro, Celexa previously.  Concerned about meds, did not take.  Option meet with therapist.  Started on fluoxetine 10 mg, low-dose in December 2022 with plan on 6-week recheck.  Seroquel if needed for sleep. Never started fluoxetine. Afraid of addiction.  Did not take seroquel.  Meeting with psychologist on 05/14/22- defers meds for now. Feeling better overall.       05/06/2022    1:23 PM 04/08/2022   11:42 AM 06/29/2021    1:36 PM 10/17/2020   10:37 AM 07/04/2020   11:13 AM  Depression screen PHQ 2/9  Decreased Interest 0 2 3 0 0  Down, Depressed, Hopeless 0 0 1 0 0  PHQ - 2 Score 0 2 4 0 0  Altered sleeping 0 3 3    Tired, decreased energy 2 2 3     Change in appetite 0 2 3    Feeling bad or failure about yourself  0 0 0    Trouble concentrating 0 0 0    Moving slowly or fidgety/restless 0 0 0    Suicidal thoughts 0 0 0    PHQ-9 Score 2 9 13       Health Maintenance  Topic Date Due  . OPHTHALMOLOGY  EXAM  09/04/2020  . COVID-19 Vaccine (1) 05/22/2022 (Originally 11/20/1971)  . Zoster Vaccines- Shingrix (1 of 2) 07/08/2022 (Originally 05/21/2020)  . INFLUENZA VACCINE  10/24/2022 (Originally 02/23/2022)  . MAMMOGRAM  04/09/2023 (Originally 10/30/2021)  . TETANUS/TDAP  04/09/2023 (Originally 05/21/1989)  . Hepatitis C Screening  04/09/2023 (Originally 05/21/1988)  . PAP SMEAR-Modifier  10/20/2022  . HEMOGLOBIN A1C  11/02/2022  . FOOT EXAM  11/05/2022  . Diabetic kidney evaluation - GFR measurement  05/04/2023  . Diabetic kidney evaluation - Urine ACR  05/04/2023  . COLONOSCOPY (Pts 45-67yrs Insurance coverage will need to be confirmed)  05/06/2031  . HIV Screening  Completed  . HPV VACCINES  Aged Out  Colonoscopy in 04/2021. Repeat 10 yrs.  overdue for mammogram. She will schedule Pap test in 2019. Negative with negative HPV.   Immunization History  Administered Date(s) Administered  .  Influenza, Seasonal, Injecte, Preservative Fre 09/06/2012  Flu vaccine - declines Also declines covid, tetanus, shingles vaccines.   Agrees to hep C screen.   No results found. Will be scheduling appt with Dr. Idolina Primer.   Dental: every 6 months.   Alcohol: less than 1 every 6 months.   Tobacco: none.   Exercise: 10k step per day.    Muscle spasms of back. About once per month treats with biofreeze or hot shower, some relief. Had leftover flexeril taken for more pain a month ago. Better now.  No bowel or bladder incontinence, no saddle anesthesia, no lower extremity weakness, fever, weight loss or night sweats. Did not fill flexeril last year.     History Patient Active Problem List   Diagnosis Date Noted  . Tension headache 07/06/2018  . Muscle spasm 07/06/2018  . Controlled diabetes mellitus with long-term current use of insulin ([place]) 08/14/2015  . Pseudohyponatremia 12/14/2012  . Hypertriglyceridemia 12/14/2012  . Benign essential HTN 11/20/2011  . Obesity, Class II, BMI 35-39.9 11/20/2011  . Sleep disturbance 11/20/2011   Past Medical History:  Diagnosis Date  . Allergy   . Anemia   . Anxiety   . Arthritis   . Depression   . Diabetes mellitus without complication ([place])   . GERD (gastroesophageal reflux disease)   . Hyperlipidemia   . Hypertension   . Neuromuscular disorder [facility])    told has Fibro after injury  . PONV (postoperative nausea and vomiting)    Past Surgical History:  Procedure Laterality Date  . COLONOSCOPY  05/05/2021  . DILATION AND CURETTAGE OF UTERUS    . INDUCED ABORTION    . MOUTH SURGERY  2022   2 caps removed  . surgery on right knee    . TOOTH EXTRACTION  10/2020   Allergies  Allergen Reactions  . Doxycycline Hives   Prior to Admission medications   Medication Sig Start Date End Date Taking? Authorizing Provider  BD PEN NEEDLE NANO 2ND GEN 32G X 4 MM MISC USE 1 PEN NEEDLE 4 TIMES DAILY 02/22/22  Yes Wendie Agreste, MD   cholecalciferol (VITAMIN D3) 25 MCG (1000 UNIT) tablet Take 1,000 Units by mouth daily.   Yes [provider]  Continuous Blood Gluc Sensor (FREESTYLE LIBRE 3 SENSOR) MISC 1 each by Does not apply route every 14 (fourteen) days. 05/03/22  Yes Philemon Kingdom, MD  cyclobenzaprine (FLEXERIL) 5 MG tablet Take 1-2 tablets (5-10 mg total) by mouth 3 (three) times daily as needed for muscle spasms. 06/29/21  Yes Wendie Agreste, MD  fenofibrate micronized (LOFIBRA) 134 MG capsule Take 1  capsule (134 mg total) by mouth daily before breakfast. 11/04/21 10/30/22 Yes Philemon Kingdom, MD  ferrous sulfate 325 (65 FE) MG tablet Take 325 mg by mouth daily with breakfast.   Yes [provider]  Lancets (ONETOUCH ULTRASOFT) lancets Use as instructed. Test blood glucose bid. E11.9 11/09/18  Yes Forrest Moron, MD  LEVEMIR FLEXPEN 100 UNIT/ML FlexPen INJECT 30 UNITS SUBCUTANEOUSLY AT NIGHT AT BEDTIME 03/25/22  Yes Philemon Kingdom, MD  losartan (COZAAR) 50 MG tablet Take 1 tablet by mouth once daily 02/18/22  Yes Wendie Agreste, MD  metFORMIN (GLUCOPHAGE) 1000 MG tablet Take 1 tablet (1,000 mg total) by mouth 2 (two) times daily. 11/04/21  Yes Philemon Kingdom, MD  Multiple Vitamin (MULTIVITAMIN) tablet Take 1 tablet by mouth daily.   Yes [provider]  Omega 3 1000 MG CAPS Take by mouth daily.   Yes [provider]  OZEMPIC, 1 MG/DOSE, 4 MG/3ML SOPN INJECT 1 ML SUBCUTANEOUSLY  ONCE A WEEK 03/24/22  Yes Philemon Kingdom, MD  VITAMIN E PO Take by mouth daily.   Yes [provider]  FLUoxetine (PROZAC) 10 MG capsule Take 1 capsule (10 mg total) by mouth daily. Patient not taking: Reported on 04/08/2022 06/29/21   Wendie Agreste, MD  QUEtiapine (SEROQUEL) 25 MG tablet Take 0.5-1 tablets (12.5-25 mg total) by mouth at bedtime. Patient not taking: Reported on 04/08/2022 06/29/21   Wendie Agreste, MD  rosuvastatin (CRESTOR) 5 MG tablet Take 1 tablet (5 mg total) by  mouth daily. Patient not taking: Reported on 05/06/2022 06/29/21   Wendie Agreste, MD   Social History   Socioeconomic History  . Marital status: Married    Spouse name: Jacqlyn Follin  . Number of children: 0  . Years of education: 12+  . Highest education level: Not on file  Occupational History  . Occupation: unemployed    Comment: Customer service manager  Tobacco Use  . Smoking status: Never  . Smokeless tobacco: Never  Vaping Use  . Vaping Use: Some days  . Devices: CBD oil  Substance and Sexual Activity  . Alcohol use: Yes    Comment: twice a year mixed drink or red wine  occ  . Drug use: Yes    Types: Marijuana    Comment: occasionally  . Sexual activity: Yes  Other Topics Concern  . Not on file  Social History Narrative   Lives with her husband.   Laid-off from job at [employer] (2014), and hasn't returned to work.   Social Determinants of Health   Financial Resource Strain: Not on file  Food Insecurity: Not on file  Transportation Needs: Not on file  Physical Activity: Not on file  Stress: Not on file  Social Connections: Not on file  Intimate Partner Violence: Not on file    Review of Systems 13 point review of systems per patient health survey noted.  Negative other than as indicated above or in HPI.    Objective:   Vitals:   05/06/22 1325  BP: 118/76  Pulse: 75  Temp: 97.9 F (36.6 C)  SpO2: 97%  Weight: 204 lb (92.5 kg)  Height: 5\' 7"  (1.702 m)   {Vitals History (Optional):23777}  Physical Exam Constitutional:      Appearance: She is well-developed.  HENT:     Head: Normocephalic and atraumatic.     Right Ear: External ear normal.     Left Ear: External ear normal.  Eyes:     Conjunctiva/sclera: Conjunctivae normal.  Pupils: Pupils are equal, round, and reactive to light.  Neck:     Thyroid: No thyromegaly.  Cardiovascular:     Rate and Rhythm: Normal rate and regular rhythm.     Heart sounds: Normal heart sounds. No murmur  heard. Pulmonary:     Effort: Pulmonary effort is normal. No respiratory distress.     Breath sounds: Normal breath sounds. No wheezing.  Abdominal:     General: Bowel sounds are normal.     Palpations: Abdomen is soft.     Tenderness: There is no abdominal tenderness.  Musculoskeletal:        General: No tenderness. Normal range of motion.     Cervical back: Normal range of motion and neck supple.  Lymphadenopathy:     Cervical: No cervical adenopathy.  Skin:    General: Skin is warm and dry.     Findings: No rash.  Neurological:     Mental Status: She is alert and oriented to person, place, and time.  Psychiatric:        Behavior: Behavior normal.        Thought Content: Thought content normal.       Assessment & Plan:  MALAYSIA CRANCE is a 51 y.o. female . Annual physical exam  .-anticipatory guidance as below in AVS, screening labs above. Health maintenance items as above in HPI discussed/recommended as applicable.   Benign essential HTN - Plan: losartan (COZAAR) 50 MG tablet - . Stable, tolerating current regimen. Medications refilled. Labs pending as above.   Controlled type 2 diabetes mellitus with hyperglycemia, with long-term current use of insulin (HCC)  - Hyperlipidemia, unspecified hyperlipidemia type  Need for hepatitis C screening test - Plan: Hepatitis C antibody  Muscle spasm of back - Plan: cyclobenzaprine (FLEXERIL) 5 MG tablet  Screening for thyroid disorder - Plan: TSH  Vitamin D deficiency - Plan: Vitamin D (25 hydroxy)  Screening for endocrine, metabolic and immunity disorder - Plan: Vitamin D (25 hydroxy), B12  Screening, anemia, deficiency, iron - Plan: CBC  Fatigue, unspecified type - Plan: TSH, Vitamin D (25 hydroxy), B12, CBC, Ferritin  Irregular menses - Plan: FSH, POCT urine pregnancy   Meds ordered this encounter  Medications  . losartan (COZAAR) 50 MG tablet    Sig: Take 1 tablet (50 mg total) by mouth daily.    Dispense:  90  tablet    Refill:  3  . cyclobenzaprine (FLEXERIL) 5 MG tablet    Sig: Take 1-2 tablets (5-10 mg total) by mouth 3 (three) times daily as needed for muscle spasms.    Dispense:  15 tablet    Refill:  1   Patient Instructions  CoQ 10 supplement over the counter if staring back on cholesterol med, and try taking once per week. If tolerated can increase to few days per week.  Follow up with therepist as planned and then if needed I am happy to discuss meds for mood again - please schedule an appointment.  Schedule visit with Dr. Idolina Primer and mammogram.  Flexeril temporarily refilled, follow-up if any increase need or worsening back pain I will check some labs, but follow up to discuss results and fatigue.          Signed,   Merri Ray, MD [facility] 05/06/22 2:22 PM

## 2022-05-07 LAB — TSH: TSH: 3.86 u[IU]/mL (ref 0.35–5.50)

## 2022-05-07 LAB — CBC
HCT: 35.8 % — ABNORMAL LOW (ref 36.0–46.0)
Hemoglobin: 12.3 g/dL (ref 12.0–15.0)
MCHC: 34.3 g/dL (ref 30.0–36.0)
MCV: 86.7 fl (ref 78.0–100.0)
Platelets: 398 10*3/uL (ref 150.0–400.0)
RBC: 4.13 Mil/uL (ref 3.87–5.11)
RDW: 13.7 % (ref 11.5–15.5)
WBC: 9.2 10*3/uL (ref 4.0–10.5)

## 2022-05-07 LAB — HEPATITIS C ANTIBODY: Hepatitis C Ab: NONREACTIVE

## 2022-05-07 LAB — VITAMIN D 25 HYDROXY (VIT D DEFICIENCY, FRACTURES): VITD: 57.65 ng/mL (ref 30.00–100.00)

## 2022-05-07 LAB — FOLLICLE STIMULATING HORMONE: FSH: 5.2 m[IU]/mL

## 2022-05-07 LAB — VITAMIN B12: Vitamin B-12: 147 pg/mL — ABNORMAL LOW (ref 211–911)

## 2022-05-07 LAB — FERRITIN: Ferritin: 38.4 ng/mL (ref 10.0–291.0)

## 2022-05-09 ENCOUNTER — Other Ambulatory Visit: Payer: Self-pay | Admitting: Internal Medicine

## 2022-05-09 DIAGNOSIS — E1165 Type 2 diabetes mellitus with hyperglycemia: Secondary | ICD-10-CM

## 2022-05-31 ENCOUNTER — Other Ambulatory Visit: Payer: Self-pay | Admitting: Family Medicine

## 2022-06-18 ENCOUNTER — Other Ambulatory Visit: Payer: Self-pay | Admitting: Internal Medicine

## 2022-06-27 ENCOUNTER — Other Ambulatory Visit: Payer: Self-pay | Admitting: Internal Medicine

## 2022-06-27 DIAGNOSIS — E1165 Type 2 diabetes mellitus with hyperglycemia: Secondary | ICD-10-CM

## 2022-07-02 ENCOUNTER — Other Ambulatory Visit: Payer: Self-pay | Admitting: Internal Medicine

## 2022-07-02 DIAGNOSIS — E1165 Type 2 diabetes mellitus with hyperglycemia: Secondary | ICD-10-CM

## 2022-08-31 ENCOUNTER — Other Ambulatory Visit: Payer: Self-pay | Admitting: Internal Medicine

## 2022-08-31 DIAGNOSIS — E1165 Type 2 diabetes mellitus with hyperglycemia: Secondary | ICD-10-CM

## 2022-09-01 ENCOUNTER — Other Ambulatory Visit: Payer: Self-pay | Admitting: Family Medicine

## 2022-09-01 DIAGNOSIS — E785 Hyperlipidemia, unspecified: Secondary | ICD-10-CM

## 2022-09-21 ENCOUNTER — Encounter: Payer: Self-pay | Admitting: Family Medicine

## 2022-10-26 ENCOUNTER — Telehealth: Payer: Self-pay | Admitting: Pharmacist

## 2022-10-26 ENCOUNTER — Other Ambulatory Visit: Payer: Self-pay | Admitting: Internal Medicine

## 2022-10-26 DIAGNOSIS — E1165 Type 2 diabetes mellitus with hyperglycemia: Secondary | ICD-10-CM

## 2022-10-26 NOTE — Telephone Encounter (Signed)
Patient is currently taking Levemir 30 units daily.  She has upcoming appointment with Endocrinologist 11/04/2022.  Called patient to see if she has enough Levemir to last until that appointment because after April 1st, 2024 Levemir FlexPen will no longer be manufacturer.  Patient does not think that she has enough to last until 11/04/2022  Called her pharmacy - they had Levemir in stock and will fill 1 refill until patient sees Dr Cruzita Lederer to discuss alternative for Levemir for the future.   Reviewed her 2024 insurance formulary for alternatives:  Levemir is tier 2 Other tier 2 basal insulins:  Glargine YFGN Semglee Toujeo and Toujeo Max Tresiba U100 and U200.    Will forward to endocrinologist to discuss with patient at upcoming appointment 11/04/2022.

## 2022-11-04 ENCOUNTER — Encounter: Payer: Self-pay | Admitting: Internal Medicine

## 2022-11-04 ENCOUNTER — Ambulatory Visit (INDEPENDENT_AMBULATORY_CARE_PROVIDER_SITE_OTHER): Payer: BC Managed Care – PPO | Admitting: Internal Medicine

## 2022-11-04 VITALS — BP 122/76 | HR 81 | Ht 67.0 in | Wt 198.0 lb

## 2022-11-04 DIAGNOSIS — E1165 Type 2 diabetes mellitus with hyperglycemia: Secondary | ICD-10-CM | POA: Diagnosis not present

## 2022-11-04 DIAGNOSIS — E781 Pure hyperglyceridemia: Secondary | ICD-10-CM

## 2022-11-04 DIAGNOSIS — E669 Obesity, unspecified: Secondary | ICD-10-CM

## 2022-11-04 DIAGNOSIS — Z794 Long term (current) use of insulin: Secondary | ICD-10-CM

## 2022-11-04 DIAGNOSIS — E785 Hyperlipidemia, unspecified: Secondary | ICD-10-CM | POA: Diagnosis not present

## 2022-11-04 LAB — POCT GLYCOSYLATED HEMOGLOBIN (HGB A1C): Hemoglobin A1C: 5.9 % — AB (ref 4.0–5.6)

## 2022-11-04 MED ORDER — ROSUVASTATIN CALCIUM 5 MG PO TABS: 5.0000 mg | ORAL_TABLET | Freq: Every day | ORAL | 3 refills | Status: DC

## 2022-11-04 MED ORDER — OZEMPIC (1 MG/DOSE) 4 MG/3ML ~~LOC~~ SOPN: 1.0000 mg | PEN_INJECTOR | SUBCUTANEOUS | 3 refills | Status: DC

## 2022-11-04 MED ORDER — FENOFIBRATE 134 MG PO CAPS: 134.0000 mg | ORAL_CAPSULE | Freq: Every day | ORAL | 3 refills | Status: AC

## 2022-11-04 MED ORDER — INSULIN GLARGINE-YFGN 100 UNIT/ML ~~LOC~~ SOPN: 30.0000 [IU] | PEN_INJECTOR | Freq: Every day | SUBCUTANEOUS | 3 refills | Status: DC

## 2022-11-04 MED ORDER — BD PEN NEEDLE NANO 2ND GEN 32G X 4 MM MISC: 3 refills | Status: DC

## 2022-11-04 MED ORDER — METFORMIN HCL 1000 MG PO TABS: 1000.0000 mg | ORAL_TABLET | Freq: Two times a day (BID) | ORAL | 3 refills | Status: DC

## 2022-11-04 NOTE — Progress Notes (Signed)
Patient ID: Carolyn Butler, female   DOB: [birth year], 52 y.o.   MRN: [id number]  HPI: Carolyn Butler is a 52 y.o.-year-old female, returning for f/u for DM2, dx 2005, insulin-dependent since 01/2013, controlled, without long-term complications. Last visit was 6 months ago.  Interim history: No increased urination, blurry vision, chest pain.  Back pain and muscle cramps, especially after exercise, but she has not been exercising consistently recently previously walking. She still has some nausea >> improved since last visit  Reviewed HbA1c levels: Lab Results  Component Value Date   HGBA1C 6.3 (A) 05/03/2022   HGBA1C 6.2 (A) 11/04/2021   HGBA1C 5.5 04/02/2021  Previous A1c was 8.4% in 06/2012.  She has a h/o med noncompliance per PCPs notes.    She is on: - Metformin 1000 mg 2x a day >> 1000 mg in am and [phone] mg at night -  >> Levemir 30 >> 32-35 >> 25 >> 30 units at night - Ozempic 0.5 mg weekly -added 05/2018 >> 1 mg weekly >> 0.5 >> 1 mg weekly She tried Toujeo in the past but developed headaches from it. Pt was on Amaryl in the past and gained 15 lbs >> stopped it and lost the weight We stopped Januvia 100 mg in 01/2013 b/c increased TG and high risk of Pancreatitis.  She was also previously on NovoLog, but was able to come off due to good diabetes control.  She is checking sugars 1x a day: - am:  104-114 >> 93-109  >> 100-112 >> 112-118 >> 98-113 - 2h after breakfast:206, 240 >> n/c >> 160s >> n/c >> 150-167 - before lunch: n/c >> 130-162 >> 110-120 >> n/c - 2h after lunch:93-141 >> 150-160 >> 140-150 >> 160-185 - before dinner: n/c >> 110-120 >> n/c >> 180-236 >> n/c  - bedtime: 1138-166, 184 >> 102-113, 153 >> n/c >> 150 >> n/c Lowest sugar was 90 >> 100 >> 98; she has hypoglycemia awareness in the 80s. Highest sugar was 170 >> 250  >> <200 >> 185.  Meter: ReliOn  No CKD, last BUN/creatinine was:  Lab Results  Component Value Date   BUN 9 05/03/2022   CREATININE  0.55 05/03/2022  Prev. On losartan (but off at last visit for 1.5 mo >> developed palpitations and BP 187/?, P 103).  We restarted Cozaar 50 mg daily.  + HL; last set of lipids: Lab Results  Component Value Date   CHOL 199 05/03/2022   HDL 33.80 (L) 05/03/2022   LDLCALC 128 (H) 10/22/2020   LDLDIRECT 134.0 05/03/2022   TRIG 313.0 (H) 05/03/2022   CHOLHDL 6 05/03/2022   Prev: Lab Results  Component Value Date   CHOL 170 05/09/2013   HDL 28* 05/09/2013   LDLCALC Comment:   Not calculated due to Triglyceride >400.  05/09/2013   LDLDIRECT 86.8 02/16/2013   TRIG 790 8 05/09/2013   CHOLHDL 6.1 05/09/2013  She was on Lipitor 10 mg - 3x a week 2/2 mm aches, gemfibrozil 600 mg 2x a day, omega-3 fatty acids 1000 mg 2x a day >> muscle aches.  At last visit, I suggested switching from gemfibrozil to  Fenofibrate 134.  After changing to fenofibrate, her muscle aches have resolved.  However, she also stopped taking Lipitor because of possible muscle aches with it.  At last visit she was only on fenofibrate as she had shoulder pain from Crestor.  I recommended to try Crestor 5 at least once a week.  She is currently  taking it once a day.  Pt's last eye exam was in 2021: No DR  No numbness and tingling in her legs.  Last foot exam 10/2021.  She has history of HTN, bruxism.  ROS: + see HPI  I reviewed pt's medications, allergies, PMH, social hx, family hx, and changes were documented in the history of present illness. Otherwise, unchanged from my initial visit note.  Past Medical History:  Diagnosis Date   Allergy    Anemia    Anxiety    Arthritis    Depression    Diabetes mellitus without complication ([facility])    GERD (gastroesophageal reflux disease)    Hyperlipidemia    Hypertension    Neuromuscular disorder ([facility])    told has Fibro after injury   PONV (postoperative nausea and vomiting)    Past Surgical History:  Procedure Laterality Date   COLONOSCOPY  05/05/2021   DILATION AND  CURETTAGE OF UTERUS     INDUCED ABORTION     MOUTH SURGERY  2022   2 caps removed   surgery on right knee     TOOTH EXTRACTION  10/2020   Social History   Socioeconomic History   Marital status: Married    Spouse name: Malenie Mckean   Number of children: 0   Years of education: 12+   Highest education level: Not on file  Occupational History   Occupation: unemployed    Comment: Psychologist, occupational  Tobacco Use   Smoking status: Never   Smokeless tobacco: Never  Vaping Use   Vaping Use: Some days   Devices: CBD oil  Substance and Sexual Activity   Alcohol use: Yes    Comment: twice a year mixed drink or red wine  occ   Drug use: Yes    Types: Marijuana    Comment: occasionally   Sexual activity: Yes  Other Topics Concern   Not on file  Social History Narrative   Lives with her husband.   Laid-off from job at [employer] (2014), and hasn't returned to work.   Social Determinants of Health   Financial Resource Strain: Not on file  Food Insecurity: Not on file  Transportation Needs: Not on file  Physical Activity: Not on file  Stress: Not on file  Social Connections: Not on file  Intimate Partner Violence: Not on file   Current Outpatient Medications on File Prior to Visit  Medication Sig Dispense Refill   BD PEN NEEDLE NANO 2ND GEN 32G X 4 MM MISC USE 1 PEN NEEDLE SUBCUTANEOUSLY  4 TIMES DAILY 100 each 0   cholecalciferol (VITAMIN D3) 25 MCG (1000 UNIT) tablet Take 1,000 Units by mouth daily.     Continuous Blood Gluc Sensor (FREESTYLE LIBRE 3 SENSOR) MISC 1 each by Does not apply route every 14 (fourteen) days. 6 each 3   cyclobenzaprine (FLEXERIL) 5 MG tablet Take 1-2 tablets (5-10 mg total) by mouth 3 (three) times daily as needed for muscle spasms. 15 tablet 1   fenofibrate micronized (LOFIBRA) 134 MG capsule Take 1 capsule (134 mg total) by mouth daily before breakfast. 90 capsule 3   ferrous sulfate 325 (65 FE) MG tablet Take 325 mg by mouth daily with breakfast.      Lancets (ONETOUCH ULTRASOFT) lancets Use as instructed. Test blood glucose bid. E11.9 100 each 12   LEVEMIR FLEXPEN 100 UNIT/ML FlexPen INJECT 30 UNITS SUBCUTANEOUSLY NIGHTLY AT BEDTIME 18 mL 0   losartan (COZAAR) 50 MG tablet Take 1 tablet (50 mg total) by  mouth daily. 90 tablet 3   metFORMIN (GLUCOPHAGE) 1000 MG tablet Take 1 tablet (1,000 mg total) by mouth 2 (two) times daily. 180 tablet 3   Multiple Vitamin (MULTIVITAMIN) tablet Take 1 tablet by mouth daily.     Omega 3 1000 MG CAPS Take by mouth daily.     rosuvastatin (CRESTOR) 5 MG tablet Take 1 tablet by mouth once daily 15 tablet 0   Semaglutide, 1 MG/DOSE, (OZEMPIC, 1 MG/DOSE,) 4 MG/3ML SOPN INJECT 1 MG INTO THE SKIN ONCE A WEEK 9 mL 2   VITAMIN E PO Take by mouth daily.     No current facility-administered medications on file prior to visit.   Allergies  Allergen Reactions   Doxycycline Hives   Family History  Problem Relation Age of Onset   Diabetes Father    Heart disease Father    Hyperlipidemia Father    Diabetes Brother    Colon cancer Paternal Uncle    Cancer - Colon Paternal Uncle 41   Diabetes Paternal Grandmother    Heart disease Paternal Grandmother    Colon polyps Neg Hx    Esophageal cancer Neg Hx    Rectal cancer Neg Hx    Stomach cancer Neg Hx    PE:  BP 122/76 (BP Location: Right Arm, Patient Position: Sitting, Cuff Size: Normal)   Pulse 81   Ht 5\' 7"  (1.702 m)   Wt 198 lb (89.8 kg)   SpO2 99%   BMI 31.01 kg/m  Wt Readings from Last 3 Encounters:  11/04/22 198 lb (89.8 kg)  05/06/22 204 lb (92.5 kg)  05/03/22 203 lb 3.2 oz (92.2 kg)   Constitutional: overweight, in NAD Eyes:  EOMI, no exophthalmos ENT: no neck masses, no cervical lymphadenopathy Cardiovascular: RRR, No MRG Respiratory: CTA B Musculoskeletal: no deformities Skin:no rashes Neurological: no tremor with outstretched hands Diabetic Foot Exam - Simple   Simple Foot Form Diabetic Foot exam was performed with the following  findings: Yes 11/04/2022 11:14 AM  Visual Inspection No deformities, no ulcerations, no other skin breakdown bilaterally: Yes Sensation Testing Intact to touch and monofilament testing bilaterally: Yes Pulse Check Posterior Tibialis and Dorsalis pulse intact bilaterally: Yes Comments    ASSESSMENT: 1. DM2, insulin-dependent, controlled, without long term complications  2. HTG  3.  Obesity class I  PLAN:  1.  Patient with history of controlled diabetes, on metformin, weekly GLP-1 receptor agonist, and basal insulin, with an HbA1c of 6.3%, slightly higher, but still at goal, at last visit.  She does have some nausea from Ozempic but tolerable.  At last visit I suggested to try Mylanta.  Otherwise, we did not change her regimen as sugars were at goal at all times of the day. -At today's visit, she tells me that her nausea is better.  -Reviewing her blood sugars at home, they have been at goal before meals and also after meals, with only rare hyperglycemic values.  There is no need to change her regimen for now, however, we need to change her insulin from Levemir to another insulin-suggested Semglee.  Will use the same dose. - I advised her to:  Patient Instructions  Please continue: - Metformin 1000 mg in am and [phone] mg at night. - Ozempic 1 mg weekly   When you run out of Levemir, use Semglee 30-35 units daily.  Please return in 6 months with your sugar log.   - we checked her HbA1c: 5.9% (lower) - advised to check sugars at different  times of the day - 1x a day, rotating check times - advised for yearly eye exams >> she is UTD - return to clinic in 6 months   2. Hypertriglyceridemia -Related lipid panel from last visit: LDL and triglycerides elevated, HDL low: Lab Results  Component Value Date   CHOL 199 05/03/2022   HDL 33.80 (L) 05/03/2022   LDLCALC 128 (H) 10/22/2020   LDLDIRECT 134.0 05/03/2022   TRIG 313.0 (H) 05/03/2022   CHOLHDL 6 05/03/2022  -She was previously  on Lipitor 10 mg 3/7 days, Lopid 600 mg twice a day and fish oil 1000 mg twice a day.  She had muscle aches so I suggested to switch from Lopid to fenofibrate and also to try to move Lipitor doses closer together.  She did change to fenofibrate and her muscle aches resolved.  However, she stopped Lipitor.  At last visit she was on fenofibrate 145 mg daily but I also suggested to start Crestor 5 mg at least weekly.  She is not taking it daily and I refilled this for her, along with the fenofibrate.  She has some muscle cramps mostly after exercise. -Previously recommended the portfolio diet to improve her cholesterol levels, but she did not start this  3.  Obesity class I -She continues on Ozempic but we had to decrease the dose due to nausea.  She still has some nausea in the morning but she would like to continue with Ozempic. -11 pounds before the last 2 visits combined.  We discussed that also changing diet is paramount for wt loss. - lost 6 lbs since last OV  Carlus Pavlovristina Nick Armel, MD PhD [facility]

## 2022-11-04 NOTE — Patient Instructions (Addendum)
Please continue: - Metformin 1000 mg in am and [phone] mg at night. - Ozempic 1 mg weekly   When you run out of Levemir, use Semglee 30-35 units daily.  Please return in 6 months with your sugar log.

## 2022-12-24 ENCOUNTER — Other Ambulatory Visit: Payer: Self-pay | Admitting: Internal Medicine

## 2022-12-24 DIAGNOSIS — E1165 Type 2 diabetes mellitus with hyperglycemia: Secondary | ICD-10-CM

## 2022-12-29 ENCOUNTER — Other Ambulatory Visit: Payer: Self-pay | Admitting: Internal Medicine

## 2022-12-29 DIAGNOSIS — E1165 Type 2 diabetes mellitus with hyperglycemia: Secondary | ICD-10-CM

## 2023-05-10 ENCOUNTER — Encounter: Payer: Self-pay | Admitting: Internal Medicine

## 2023-05-10 ENCOUNTER — Ambulatory Visit (INDEPENDENT_AMBULATORY_CARE_PROVIDER_SITE_OTHER): Payer: BC Managed Care – PPO | Admitting: Internal Medicine

## 2023-05-10 VITALS — BP 124/70 | HR 88 | Ht 67.0 in | Wt 183.8 lb

## 2023-05-10 DIAGNOSIS — E1165 Type 2 diabetes mellitus with hyperglycemia: Secondary | ICD-10-CM

## 2023-05-10 DIAGNOSIS — Z794 Long term (current) use of insulin: Secondary | ICD-10-CM

## 2023-05-10 DIAGNOSIS — E663 Overweight: Secondary | ICD-10-CM

## 2023-05-10 DIAGNOSIS — E781 Pure hyperglyceridemia: Secondary | ICD-10-CM

## 2023-05-10 LAB — LIPID PANEL
Cholesterol: 237 mg/dL — ABNORMAL HIGH (ref 0–200)
HDL: 36.8 mg/dL — ABNORMAL LOW (ref >39.00)
LDL Cholesterol: 138 mg/dL — ABNORMAL HIGH (ref 0–99)
NonHDL: 200.45
Total CHOL/HDL Ratio: 6
Triglycerides: 313 mg/dL — ABNORMAL HIGH (ref 0.0–149.0)
VLDL: 62.6 mg/dL — ABNORMAL HIGH (ref 0.0–40.0)

## 2023-05-10 LAB — COMPREHENSIVE METABOLIC PANEL
ALT: 17 U/L (ref 0–35)
AST: 13 U/L (ref 0–37)
Albumin: 4.5 g/dL (ref 3.5–5.2)
Alkaline Phosphatase: 37 U/L — ABNORMAL LOW (ref 39–117)
BUN: 11 mg/dL (ref 6–23)
CO2: 26 meq/L (ref 19–32)
Calcium: 9.8 mg/dL (ref 8.4–10.5)
Chloride: 101 meq/L (ref 96–112)
Creatinine, Ser: 0.51 mg/dL (ref 0.40–1.20)
GFR: 106.91 mL/min (ref >60.00)
Glucose, Bld: 96 mg/dL (ref 70–99)
Potassium: 4 meq/L (ref 3.5–5.1)
Sodium: 136 meq/L (ref 135–145)
Total Bilirubin: 0.4 mg/dL (ref 0.2–1.2)
Total Protein: 7.4 g/dL (ref 6.0–8.3)

## 2023-05-10 LAB — POCT GLYCOSYLATED HEMOGLOBIN (HGB A1C): Hemoglobin A1C: 5.6 % (ref 4.0–5.6)

## 2023-05-10 LAB — MICROALBUMIN / CREATININE URINE RATIO
Creatinine,U: 33.9 mg/dL
Microalb Creat Ratio: 2.1 mg/g (ref 0.0–30.0)
Microalb, Ur: <0.7 mg/dL (ref 0.0–1.9)

## 2023-05-10 NOTE — Patient Instructions (Addendum)
Please continue: - Metformin 1000 mg in am and [phone] mg at night. - Ozempic 1 mg weekly  - Semglee 30-35 units daily   Please start taking daily: - Crestor 5 mg  - Fenofibrate 134 mg   YOU NEED AN EYE EXAM!  Please stop at the lab.  Please return in 6 months with your sugar log.

## 2023-05-10 NOTE — Progress Notes (Unsigned)
Patient ID: Carolyn Butler, female   DOB: 30-Jul-1970, 52 y.o.   MRN: [id number]  HPI: [name] is a 52 y.o.-year-old female, returning for f/u for DM2, dx 2005, insulin-dependent since 01/2013, controlled, without long-term complications. Last visit was 6 months ago.  Interim history: No increased urination, blurry vision, chest pain.  He continues to have some back and muscle cramps after exercise. She still has some nausea >> improved since last visit. She walks daily - 1-2h a day, with her new dog - St. Bernard (Houdini).  Reviewed HbA1c levels: Lab Results  Component Value Date   HGBA1C 5.9 (A) 11/04/2022   HGBA1C 6.3 (A) 05/03/2022   HGBA1C 6.2 (A) 11/04/2021  Previous A1c was 8.4% in 06/2012.  She has a h/o med noncompliance per PCPs notes.    She is on: - Metformin 1000 mg 2x a day >> 1000 mg in am and [phone] mg at night - Lantus >> Levemir 30 >> 32-35 >> 25 >> 30 units at night >> Semglee - Ozempic 0.5 >> 1 mg weekly She tried Toujeo in the past but developed headaches from it. Pt was on Amaryl in the past and gained 15 lbs >> stopped it and lost the weight We stopped Januvia 100 mg in 01/2013 b/c increased TG and high risk of Pancreatitis.  She was also previously on NovoLog, but was able to come off due to good diabetes control.  She is checking sugars 1x a day: - am: 93-109  >> 100-112 >> 112-118 >> 98-113 >> 112-117 - 2h after breakfast: 160s >> n/c >> 150-167 >> 130-174 - before lunch: n/c >> 130-162 >> 110-120 >> n/c - 2h after lunch: 150-160 >> 140-150 >> 160-185 >> n/c - before dinner: 110-120 >> n/c >> 180-236 >> n/c >> 117 - bedtime: 102-113, 153 >> n/c >> 150 >> n/c >> 131 Lowest sugar was 90 >> 100 >> 98 >> 112; she has hypoglycemia awareness in the 80s. Highest sugar was 250  >> <200 >> 185 >> 174  Meter: ReliOn  No CKD, last BUN/creatinine was:  Lab Results  Component Value Date   BUN 9 05/03/2022   CREATININE 0.55 05/03/2022  Prev. Off  losartan for 1.5 mo >> developed palpitations and BP 187/?, P 103.  We restarted Cozaar 50 mg daily.  + HL; last set of lipids: Lab Results  Component Value Date   CHOL 199 05/03/2022   HDL 33.80 (L) 05/03/2022   LDLCALC 128 (H) 10/22/2020   LDLDIRECT 134.0 05/03/2022   TRIG 313.0 (H) 05/03/2022   CHOLHDL 6 05/03/2022   Prev: Lab Results  Component Value Date   CHOL 170 05/09/2013   HDL 28* 05/09/2013   LDLCALC Comment:   Not calculated due to Triglyceride >400.  05/09/2013   LDLDIRECT 86.8 02/16/2013   TRIG 790 8 05/09/2013   CHOLHDL 6.1 05/09/2013  She was on Lipitor 10 mg - 3x a week 2/2 mm aches, gemfibrozil 600 mg 2x a day, omega-3 fatty acids 1000 mg 2x a day >> muscle aches.  At last visit, I suggested switching from gemfibrozil to  Fenofibrate 134.  After changing to fenofibrate, her muscle aches have resolved.  However, she also stopped taking Lipitor because of possible muscle aches with it.   I recommended to try Crestor 5 at least once a week.  She was taking it once a day - but missing doses.  Pt's last eye exam was in 2021: No DR  No numbness  and tingling in her legs.  Last foot exam 11/04/2022.  She has history of HTN, bruxism.  ROS: + see HPI  I reviewed pt's medications, allergies, PMH, social hx, family hx, and changes were documented in the history of present illness. Otherwise, unchanged from my initial visit note.  Past Medical History:  Diagnosis Date   Allergy    Anemia    Anxiety    Arthritis    Depression    Diabetes mellitus without complication (HCC)    GERD (gastroesophageal reflux disease)    Hyperlipidemia    Hypertension    Neuromuscular disorder (HCC)    told has Fibro after injury   PONV (postoperative nausea and vomiting)    Past Surgical History:  Procedure Laterality Date   COLONOSCOPY  05/05/2021   DILATION AND CURETTAGE OF UTERUS     INDUCED ABORTION     MOUTH SURGERY  2022   2 caps removed   surgery on right knee     TOOTH  EXTRACTION  10/2020   Social History   Socioeconomic History   Marital status: Married    Spouse name: Carolyn Butler   Number of children: 0   Years of education: 12+   Highest education level: Not on file  Occupational History   Occupation: unemployed    Comment: Psychologist, occupational  Tobacco Use   Smoking status: Never   Smokeless tobacco: Never  Vaping Use   Vaping status: Some Days   Devices: CBD oil  Substance and Sexual Activity   Alcohol use: Yes    Comment: twice a year mixed drink or red wine  occ   Drug use: Yes    Types: Marijuana    Comment: occasionally   Sexual activity: Yes  Other Topics Concern   Not on file  Social History Narrative   Lives with her husband.   Laid-off from job at [employer] (2014), and hasn't returned to work.   Social Determinants of Health   Financial Resource Strain: Not on file  Food Insecurity: Not on file  Transportation Needs: Not on file  Physical Activity: Not on file  Stress: Not on file  Social Connections: Not on file  Intimate Partner Violence: Not on file   Current Outpatient Medications on File Prior to Visit  Medication Sig Dispense Refill   cholecalciferol (VITAMIN D3) 25 MCG (1000 UNIT) tablet Take 1,000 Units by mouth daily.     Continuous Blood Gluc Sensor (FREESTYLE LIBRE 3 SENSOR) MISC 1 each by Does not apply route every 14 (fourteen) days. 6 each 3   cyclobenzaprine (FLEXERIL) 5 MG tablet Take 1-2 tablets (5-10 mg total) by mouth 3 (three) times daily as needed for muscle spasms. 15 tablet 1   fenofibrate micronized (LOFIBRA) 134 MG capsule Take 1 capsule (134 mg total) by mouth daily before breakfast. 90 capsule 3   ferrous sulfate 325 (65 FE) MG tablet Take 325 mg by mouth daily with breakfast.     insulin glargine-yfgn (SEMGLEE) 100 UNIT/ML Pen Inject 30-35 Units into the skin at bedtime. 30 mL 3   Insulin Pen Needle (BD PEN NEEDLE NANO 2ND GEN) 32G X 4 MM MISC USE 1 PEN NEEDLE SUBCUTANEOUSLY  4 TIMES DAILY 100 each  3   Lancets (ONETOUCH ULTRASOFT) lancets Use as instructed. Test blood glucose bid. E11.9 100 each 12   losartan (COZAAR) 50 MG tablet Take 1 tablet (50 mg total) by mouth daily. 90 tablet 3   metFORMIN (GLUCOPHAGE) 1000 MG tablet Take  1 tablet (1,000 mg total) by mouth 2 (two) times daily. 180 tablet 3   Multiple Vitamin (MULTIVITAMIN) tablet Take 1 tablet by mouth daily.     Omega 3 1000 MG CAPS Take by mouth daily.     rosuvastatin (CRESTOR) 5 MG tablet Take 1 tablet (5 mg total) by mouth daily. 90 tablet 3   Semaglutide, 1 MG/DOSE, (OZEMPIC, 1 MG/DOSE,) 4 MG/3ML SOPN Inject 1 mg into the skin once a week. 9 mL 3   VITAMIN E PO Take by mouth daily.     No current facility-administered medications on file prior to visit.   Allergies  Allergen Reactions   Doxycycline Hives   Family History  Problem Relation Age of Onset   Diabetes Father    Heart disease Father    Hyperlipidemia Father    Diabetes Brother    Colon cancer Paternal Uncle    Cancer - Colon Paternal Uncle 4   Diabetes Paternal Grandmother    Heart disease Paternal Grandmother    Colon polyps Neg Hx    Esophageal cancer Neg Hx    Rectal cancer Neg Hx    Stomach cancer Neg Hx    PE:  BP 124/70   Pulse 88   Ht 5\' 7"  (1.702 m)   Wt 183 lb 12.8 oz (83.4 kg)   SpO2 97%   BMI 28.79 kg/m  Wt Readings from Last 3 Encounters:  05/10/23 183 lb 12.8 oz (83.4 kg)  11/04/22 198 lb (89.8 kg)  05/06/22 204 lb (92.5 kg)   Constitutional: overweight, in NAD Eyes:  EOMI, no exophthalmos ENT: no neck masses, no cervical lymphadenopathy Cardiovascular: RRR, No MRG Respiratory: CTA B Musculoskeletal: no deformities Skin:no rashes Neurological: no tremor with outstretched hands  ASSESSMENT: 1. DM2, insulin-dependent, controlled, without long term complications  2. HTG  3.  Overweight  PLAN:  1.  Patient with history of controlled diabetes, on metformin, weekly GLP-1 receptor agonist and basal insulin, with  improving control.  At last visit, HbA1c was lower, at 5.9% and we did not change the regimen other than switching from Levemir to [facility] due to Levemir being taken out from the market.  Sugars were at goal with only occasional hyperglycemic values after some meals. - at today's visit, sugars are at goal.  I did not recommend a change in the regimen but we did discuss that after the holidays, especially if she starts seeing better blood sugars, to start reducing the Semglee dose to 25 units if possible. - I advised her to:  Patient Instructions  Please continue: - Metformin 1000 mg in am and [phone] mg at night. - Ozempic 1 mg weekly  - Semglee 30-35 units daily   Please start taking daily: - Crestor 5 mg  - Fenofibrate 134 mg   YOU NEED AN EYE EXAM!  Please stop at the lab.  Please return in 6 months with your sugar log.   - we checked her HbA1c: 5.6% (lower) - advised to check sugars at different times of the day - 1x a day, rotating check times - advised for yearly eye exams >> she is UTD - return to clinic in 6 months   2. Hypertriglyceridemia -Reviewed latest lipid panel from last year: All fractions abnormal: Lab Results  Component Value Date   CHOL 199 05/03/2022   HDL 33.80 (L) 05/03/2022   LDLCALC 128 (H) 10/22/2020   LDLDIRECT 134.0 05/03/2022   TRIG 313.0 (H) 05/03/2022   CHOLHDL 6 05/03/2022  -  She was previously on Lipitor 10 mg 3/7 days, Lopid 600 mg twice a day and fish oil 1000 mg twice a day.  She had muscle aches so I suggested to switch from Lopid to fenofibrate and also to try to move Lipitor doses closer together.  She did change to fenofibrate and her muscle aches resolved.  However, she stopped Lipitor.  We started Crestor 5 mg. She has some muscle cramps mostly after exercise.  She tells me that she missed doses of both fenofibrate and rosuvastatin since last visit.  Discussed about the importance of taking these consistently. - she is due for another lipid  panel  3.  Obesity class I -We had to decrease the dose of Ozempic due to nausea.  She still has some nausea in the morning but would like to continue with Ozempic. - we discussed about changing diet at last visit - lost 6 lbs before last visit - since last visit, she started walking her dog 1 to 2 hours daily - she lost 20 lbs since last OV but gained pounds recently, so she lost a net 15 pounds since our last visit.  Carlus Pavlov, MD PhD [facility] Endocrin[facility]

## 2023-05-16 ENCOUNTER — Other Ambulatory Visit: Payer: Self-pay | Admitting: Family Medicine

## 2023-05-16 DIAGNOSIS — I10 Essential (primary) hypertension: Secondary | ICD-10-CM

## 2023-11-02 ENCOUNTER — Telehealth: Payer: Self-pay | Admitting: Internal Medicine

## 2023-11-02 NOTE — Telephone Encounter (Signed)
 Patient is calling to say that she no longer has insurance and not sure if she is getting [employer].  Patient states that  Ozempic (1 MG/DOSE) Semaglutide, 1 MG/DOSE, (OZEMPIC, 1 MG/DOSE,) 4 MG/3ML SOPN  and insulin glargine-yfgn insulin glargine-yfgn (SEMGLEE) 100 UNIT/ML Pen Are too expensive for her to pay out of pocket.  Patient would like to know what she can do to get these prescriptions.

## 2023-11-08 ENCOUNTER — Ambulatory Visit: Payer: BC Managed Care – PPO | Admitting: Internal Medicine

## 2023-11-12 ENCOUNTER — Other Ambulatory Visit: Payer: Self-pay | Admitting: Internal Medicine

## 2023-11-12 DIAGNOSIS — E1165 Type 2 diabetes mellitus with hyperglycemia: Secondary | ICD-10-CM

## 2023-11-17 ENCOUNTER — Ambulatory Visit: Admitting: Family Medicine

## 2024-07-27 ENCOUNTER — Encounter: Payer: Self-pay | Admitting: Internal Medicine

## 2024-07-27 ENCOUNTER — Ambulatory Visit: Admitting: Internal Medicine

## 2024-07-27 VITALS — BP 132/80 | HR 93 | Ht 67.0 in | Wt 194.0 lb

## 2024-07-27 DIAGNOSIS — E66811 Obesity, class 1: Secondary | ICD-10-CM | POA: Diagnosis not present

## 2024-07-27 DIAGNOSIS — I10 Essential (primary) hypertension: Secondary | ICD-10-CM | POA: Diagnosis not present

## 2024-07-27 DIAGNOSIS — E785 Hyperlipidemia, unspecified: Secondary | ICD-10-CM | POA: Diagnosis not present

## 2024-07-27 DIAGNOSIS — E781 Pure hyperglyceridemia: Secondary | ICD-10-CM | POA: Diagnosis not present

## 2024-07-27 DIAGNOSIS — E1165 Type 2 diabetes mellitus with hyperglycemia: Secondary | ICD-10-CM | POA: Diagnosis not present

## 2024-07-27 DIAGNOSIS — Z794 Long term (current) use of insulin: Secondary | ICD-10-CM | POA: Diagnosis not present

## 2024-07-27 LAB — GLUCOSE, POCT (MANUAL RESULT ENTRY): POC Glucose: 377 mg/dL — AB (ref 70–99)

## 2024-07-27 LAB — POCT GLYCOSYLATED HEMOGLOBIN (HGB A1C): Hemoglobin A1C: 13.8 % — AB (ref 4.0–5.6)

## 2024-07-27 MED ORDER — ROSUVASTATIN CALCIUM 5 MG PO TABS: 5.0000 mg | ORAL_TABLET | Freq: Every day | ORAL | 3 refills | Status: AC

## 2024-07-27 MED ORDER — LOSARTAN POTASSIUM 50 MG PO TABS: 50.0000 mg | ORAL_TABLET | Freq: Every day | ORAL | 3 refills | Status: AC

## 2024-07-27 MED ORDER — SEMAGLUTIDE(0.25 OR 0.5MG/DOS) 2 MG/3ML ~~LOC~~ SOPN: 0.5000 mg | PEN_INJECTOR | SUBCUTANEOUS | 3 refills | Status: DC

## 2024-07-27 MED ORDER — LANTUS SOLOSTAR 100 UNIT/ML ~~LOC~~ SOPN: 30.0000 [IU] | PEN_INJECTOR | Freq: Every day | SUBCUTANEOUS | 3 refills | Status: AC

## 2024-07-27 MED ORDER — BD PEN NEEDLE NANO 2ND GEN 32G X 4 MM MISC: 3 refills | Status: AC

## 2024-07-27 MED ORDER — METFORMIN HCL 1000 MG PO TABS: 1000.0000 mg | ORAL_TABLET | Freq: Two times a day (BID) | ORAL | 3 refills | Status: AC

## 2024-07-27 MED ORDER — FREESTYLE LIBRE 3 PLUS SENSOR MISC: 1.0000 | 3 refills | Status: AC

## 2024-07-27 NOTE — Patient Instructions (Addendum)
 Please continue: - Metformin  1000 mg in am and 1000 mg at night  Restart: - Semglee  30 units daily - Ozempic  0.25 mg weekly x 2 doses, then increase to 0.5 mg weekly   Please restart: daily: - Crestor  5 mg  - Fenofibrate  134 mg   YOU NEED AN EYE EXAM!  Please return in 1-1.5 months with your sugar log.

## 2024-07-27 NOTE — Progress Notes (Signed)
 Patient ID: Carolyn Butler, female   DOB: 12-07-1970, 54 y.o.   MRN: [id number]  HPI: Carolyn Butler is a 54 y.o.-year-old female, returning for f/u for DM2, dx 2005, insulin -dependent since 01/2013, controlled, without long-term complications. Last visit was 1 year and 2 months ago. Since last visit, she was without insurance and was not able to follow-up as recommended.  Interim history: Mentions increased thirst and urination, blurry vision, and weight loss. She came off insulin  and GLP1 R agonist as she lost her insurance since last OV. She has numbness in her feet, vulvar irritation and swelling. She started a cranberry supplement and she feels that this is helping.  Reviewed HbA1c levels: Lab Results  Component Value Date   HGBA1C 5.6 05/10/2023   HGBA1C 5.9 (A) 11/04/2022   HGBA1C 6.3 (A) 05/03/2022  Previous A1c was 8.4% in 06/2012.  She has a h/o med noncompliance per PCPs notes.    She is on: - Metformin  1000 mg 2x a day - Lantus  >> Levemir 30 >> 32-35 >> 25 >> 30 units at night >> Semglee  25 units daily >> OFF 2/2 no insurance - Ozempic  0.5 >> 1 mg weekly >> OFF 2/2 no insurance She tried Toujeo  in the past but developed headaches from it. Pt was on Amaryl  in the past and gained 15 lbs >> stopped it and lost the weight We stopped Januvia  100 mg in 01/2013 b/c increased TG and high risk of Pancreatitis.  She was also previously on NovoLog , but was able to come off due to good diabetes control.  She was checking sugars 1x a day >> did not check lately: - am: 100-112 >> 112-118 >> 98-113 >> 112-117 - 2h after breakfast: n/c >> 150-167 >> 130-174 - before lunch: n/c >> 130-162 >> 110-120 >> n/c - 2h after lunch: 150-160 >> 140-150 >> 160-185 >> n/c - before dinner: 110-120 >> n/c >> 180-236 >> n/c >> 117 - bedtime: 102-113, 153 >> n/c >> 150 >> n/c >> 131 Lowest sugar was 98 >> 112; she has hypoglycemia awareness in the 80s. Highest sugar was 250  >> .SABRASABRA 174  Meter:  ReliOn  No CKD, last BUN/creatinine was:  Lab Results  Component Value Date   BUN 11 05/10/2023   CREATININE 0.51 05/10/2023   Lab Results  Component Value Date   MICRALBCREAT 16.2 10/19/2017  Prev. Off losartan  for 1.5 mo >> developed palpitations and BP 187/?, P 103.  We restarted Cozaar  50 mg daily, now off.  + HL; last set of lipids: Lab Results  Component Value Date   CHOL 237 (H) 05/10/2023   HDL 36.80 (L) 05/10/2023   LDLCALC 138 (H) 05/10/2023   LDLDIRECT 134.0 05/03/2022   TRIG 313.0 (H) 05/10/2023   CHOLHDL 6 05/10/2023   Prev: Lab Results  Component Value Date   CHOL 170 05/09/2013   HDL 28* 05/09/2013   LDLCALC Comment:   Not calculated due to Triglyceride >400.  05/09/2013   LDLDIRECT 86.8 02/16/2013   TRIG 790 8 05/09/2013   CHOLHDL 6.1 05/09/2013  She was on Lipitor 10 mg - 3x a week 2/2 mm aches, gemfibrozil  600 mg 2x a day, omega-3 fatty acids 1000 mg 2x a day >> muscle aches.  At last visit, I suggested switching from gemfibrozil  to  Fenofibrate  134.  After changing to fenofibrate , her muscle aches have resolved.  However, she also stopped taking Lipitor because of possible muscle aches with it.   I recommended to  try Crestor  5 at least once a week.  She then started to take it once a day.  Now off.  Pt's last eye exam was in 2021: No DR  No numbness and tingling in her legs.  Last foot exam 11/04/2022.  She has history of HTN, bruxism.  ROS: + see HPI  I reviewed pt's medications, allergies, PMH, social hx, family hx, and changes were documented in the history of present illness. Otherwise, unchanged from my initial visit note.  Past Medical History:  Diagnosis Date   Allergy    Anemia    Anxiety    Arthritis    Depression    Diabetes mellitus without complication (HCC)    GERD (gastroesophageal reflux disease)    Hyperlipidemia    Hypertension    Neuromuscular disorder (HCC)    told has Fibro after injury   PONV (postoperative nausea and  vomiting)    Past Surgical History:  Procedure Laterality Date   COLONOSCOPY  05/05/2021   DILATION AND CURETTAGE OF UTERUS     INDUCED ABORTION     MOUTH SURGERY  2022   2 caps removed   surgery on right knee     TOOTH EXTRACTION  10/2020   Social History   Socioeconomic History   Marital status: Married    Spouse name: Skii Cleland   Number of children: 0   Years of education: 12+   Highest education level: Not on file  Occupational History   Occupation: unemployed    Comment: psychologist, occupational  Tobacco Use   Smoking status: Never   Smokeless tobacco: Never  Vaping Use   Vaping status: Some Days   Devices: CBD oil  Substance and Sexual Activity   Alcohol use: Yes    Comment: twice a year mixed drink or red wine  occ   Drug use: Yes    Types: Marijuana    Comment: occasionally   Sexual activity: Yes  Other Topics Concern   Not on file  Social History Narrative   Lives with her husband.   Laid-off from job at [employer] (2014), and hasn't returned to work.   Social Drivers of Health   Tobacco Use: Low Risk (05/10/2023)   Patient History    Smoking Tobacco Use: Never    Smokeless Tobacco Use: Never    Passive Exposure: Not on file  Financial Resource Strain: Not on file  Food Insecurity: Not on file  Transportation Needs: Not on file  Physical Activity: Not on file  Stress: Not on file  Social Connections: Not on file  Intimate Partner Violence: Not on file  Depression (PHQ2-9): Low Risk (05/06/2022)   Depression (PHQ2-9)    PHQ-2 Score: 2  Recent Concern: Depression (PHQ2-9) - Medium Risk (04/08/2022)   Depression (PHQ2-9)    PHQ-2 Score: 9  Alcohol Screen: Not on file  Housing: Not on file  Utilities: Not on file  Health Literacy: Not on file   Current Outpatient Medications on File Prior to Visit  Medication Sig Dispense Refill   cholecalciferol (VITAMIN D3) 25 MCG (1000 UNIT) tablet Take 1,000 Units by mouth daily.     Continuous Blood Gluc Sensor  (FREESTYLE LIBRE 3 SENSOR) MISC 1 each by Does not apply route every 14 (fourteen) days. 6 each 3   cyclobenzaprine  (FLEXERIL ) 5 MG tablet Take 1-2 tablets (5-10 mg total) by mouth 3 (three) times daily as needed for muscle spasms. 15 tablet 1   fenofibrate  micronized (LOFIBRA) 134 MG capsule Take  1 capsule (134 mg total) by mouth daily before breakfast. 90 capsule 3   ferrous sulfate 325 (65 FE) MG tablet Take 325 mg by mouth daily with breakfast.     insulin  glargine-yfgn (SEMGLEE ) 100 UNIT/ML Pen Inject 30-35 Units into the skin at bedtime. 30 mL 3   Insulin  Pen Needle (BD PEN NEEDLE NANO 2ND GEN) 32G X 4 MM MISC USE 1 PEN NEEDLE SUBCUTANEOUSLY  4 TIMES DAILY 100 each 3   Lancets (ONETOUCH ULTRASOFT) lancets Use as instructed. Test blood glucose bid. E11.9 100 each 12   losartan  (COZAAR ) 50 MG tablet Take 1 tablet (50 mg total) by mouth daily. 90 tablet 3   metFORMIN  (GLUCOPHAGE ) 1000 MG tablet Take 1 tablet by mouth twice daily 180 tablet 3   Multiple Vitamin (MULTIVITAMIN) tablet Take 1 tablet by mouth daily.     Omega 3 1000 MG CAPS Take by mouth daily.     rosuvastatin  (CRESTOR ) 5 MG tablet Take 1 tablet (5 mg total) by mouth daily. 90 tablet 3   Semaglutide , 1 MG/DOSE, (OZEMPIC , 1 MG/DOSE,) 4 MG/3ML SOPN Inject 1 mg into the skin once a week. 9 mL 3   VITAMIN E PO Take by mouth daily.     No current facility-administered medications on file prior to visit.   Allergies  Allergen Reactions   Doxycycline  Hives   Family History  Problem Relation Age of Onset   Diabetes Father    Heart disease Father    Hyperlipidemia Father    Diabetes Brother    Colon cancer Paternal Uncle    Cancer - Colon Paternal Uncle 61   Diabetes Paternal Grandmother    Heart disease Paternal Grandmother    Colon polyps Neg Hx    Esophageal cancer Neg Hx    Rectal cancer Neg Hx    Stomach cancer Neg Hx    PE:  BP 132/80   Pulse 93   Ht 5' 7 (1.702 m)   Wt 194 lb (88 kg)   SpO2 98%   BMI 30.38  kg/m  Wt Readings from Last 3 Encounters:  07/27/24 194 lb (88 kg)  05/10/23 183 lb 12.8 oz (83.4 kg)  11/04/22 198 lb (89.8 kg)   Constitutional: overweight, in NAD Eyes:  EOMI, no exophthalmos ENT: no neck masses, no cervical lymphadenopathy Cardiovascular: Tachycardia, RR, No MRG Respiratory: CTA B Musculoskeletal: no deformities Skin:no rashes Neurological: no tremor with outstretched hands Diabetic Foot Exam - Simple   Simple Foot Form Diabetic Foot exam was performed with the following findings: Yes 07/27/2024  9:35 AM  Visual Inspection No deformities, no ulcerations, no other skin breakdown bilaterally: Yes Sensation Testing Intact to touch and monofilament testing bilaterally: Yes Pulse Check Posterior Tibialis and Dorsalis pulse intact bilaterally: Yes Comments    ASSESSMENT: 1. DM2, insulin -dependent, controlled, without long term complications  2. HTG  3.  Obesity class I  4. HTN  PLAN:  1.  Patient with history of fairly well-controlled diabetes, now with significantly worse control.  She was on metformin , weekly GLP-1 receptor agonist and basal insulin , with previously improving control.  At last visit, HbA1c was lower, at 5.6% and sugars were at goal.  We did not change her regimen but we discussed to start reducing the dose of long-acting insulin  if possible after the holidays. - she now returns after long absence of 1 year and 2 months, off insulin  and GLP1 R agonist 2/2 lack of insurance.  She mentions that she has been  off these for almost a year.  She is only on metformin .  Glu in the office 377. She is not checking blood sugars at home.  HbA1c has increased drastically (see below).  She is symptomatic with increased thirst and urination, vaginal irritation and blurry vision.  She also mentions losing weight recently, although, per our scale, she gained a net 11 pounds since last visit.  She is at high risk for cardiovascular events including stroke and heart  attack in the setting of such significantly worse blood sugar control.  I did advise her to stay very well-hydrated. -  of she did recently obtain [employer]. - At today's visit, we discussed that she will need to be tested for insulin  deficiency.  Sugars are too high now to be able to do this, we will plan to do so at next visit.  In the meantime, we will restart insulin  (I advised her to start at a lower dose, 20 units daily, and increase as needed) and we will also restart Ozempic  since she did so well on this in the past.  I did advise her to let me know if the sugars do not improve on this regimen or if she has GI symptoms, in which case we will need to add mealtime insulin . -She is not checking blood sugars and we discussed that she will definitely need to start.  I sent another prescription for a sensor to her pharmacy. - I advised her to:  Patient Instructions  Please continue: - Metformin  1000 mg in am and 1000 mg at night  Restart: - Semglee  30 units daily - Ozempic  0.25 mg weekly x 2 doses, then increase to 0.5 mg weekly   Please restart: daily: - Crestor  5 mg  - Fenofibrate  134 mg   YOU NEED AN EYE EXAM!  Please return in 1-1.5 months with your sugar log.   - we checked her HbA1c: 13.8% (extremely high) - advised to check sugars at different times of the day - 1x a day, rotating check times - advised for yearly eye exams >> she is UTD - return to clinic in 1-1.5 months   2. Hypertriglyceridemia - Latest lipid panel was reviewed from 14 months ago: All fractions abnormal: Lab Results  Component Value Date   CHOL 237 (H) 05/10/2023   HDL 36.80 (L) 05/10/2023   LDLCALC 138 (H) 05/10/2023   LDLDIRECT 134.0 05/03/2022   TRIG 313.0 (H) 05/10/2023   CHOLHDL 6 05/10/2023  -She was previously on Lipitor 10 mg 3/7 days, Lopid  600 mg twice a day and fish oil 1000 mg twice a day.  She had muscle aches so I suggested to switch from Lopid  to fenofibrate  and also to  try to move Lipitor doses closer together.  She did change to fenofibrate  and her muscle aches resolved.  However, she stopped Lipitor.  We then started Crestor  5 mg daily but she had some muscle cramps mostly after exercise.  At last visit, however, she was off both Crestor  and fenofibrate  and we discussed about the importance of taking these consistently.  At today's visit, she mentions that she is off her cholesterol medications and I advised her to restart them.  Refilled the prescriptions. - She is due for another lipid panel  3.  Obesity class 1 - At last visit she was in the overweight category, but now back into the obesity category - We previously had to decrease the dose of Ozempic  due to nausea.  She still  had some nausea in the morning but would like to continue with Ozempic .  As mentioned above, she is now off Ozempic  but will try to restart it at a low dose and increase as needed - Before last visit, she lost a net 15 pounds since the previous visit, previously lost 6; however, since last visit, despite worsening diabetes control, she gained 11 pounds - At last visit she was telling me that she started walking her dog 1 to 2 hours daily.  She continues with this.  4. HTN - Blood pressure is above target at today's visit despite increased dehydration in the setting of glucotoxicity - Upon questioning, she is off Cozaar  -we will restart this today-sent a prescription to her pharmacy  Lela Fendt, MD PhD [facility]

## 2024-07-31 ENCOUNTER — Other Ambulatory Visit: Payer: Self-pay | Admitting: Internal Medicine

## 2024-07-31 ENCOUNTER — Encounter: Payer: Self-pay | Admitting: Internal Medicine

## 2024-08-01 ENCOUNTER — Other Ambulatory Visit (HOSPITAL_COMMUNITY): Payer: Self-pay

## 2024-08-01 ENCOUNTER — Encounter: Payer: Self-pay | Admitting: Pharmacy Technician

## 2024-08-01 MED ORDER — OZEMPIC (0.25 OR 0.5 MG/DOSE) 2 MG/3ML ~~LOC~~ SOPN: PEN_INJECTOR | SUBCUTANEOUS | 0 refills | Status: DC

## 2024-08-01 NOTE — Telephone Encounter (Signed)
 Patient calling checking on status of medication refill 0.5 mg Ozempic ? Are we sending this in?

## 2024-08-01 NOTE — Telephone Encounter (Signed)
 0.5 mg of Ozempic  was sent to pharmacy.

## 2024-08-02 ENCOUNTER — Other Ambulatory Visit: Payer: Self-pay | Admitting: Internal Medicine

## 2024-08-02 DIAGNOSIS — Z794 Long term (current) use of insulin: Secondary | ICD-10-CM

## 2024-08-02 NOTE — Telephone Encounter (Signed)
 error

## 2024-08-20 ENCOUNTER — Other Ambulatory Visit (HOSPITAL_COMMUNITY): Payer: Self-pay

## 2024-08-20 ENCOUNTER — Encounter: Payer: Self-pay | Admitting: Pharmacy Technician

## 2024-08-20 NOTE — Telephone Encounter (Signed)
 error

## 2024-09-07 ENCOUNTER — Ambulatory Visit: Admitting: Internal Medicine

## 2024-09-13 ENCOUNTER — Encounter: Admitting: Family Medicine
